# Patient Record
Sex: Female | Born: 1980 | Race: White | Hispanic: No | Marital: Single | Smoking: Never smoker
Health system: Southern US, Community
[De-identification: ages and names within clinical notes are randomized; demographics above are authoritative.]

## PROBLEM LIST (undated history)

## (undated) DIAGNOSIS — K219 Gastro-esophageal reflux disease without esophagitis: Secondary | ICD-10-CM

## (undated) DIAGNOSIS — J189 Pneumonia, unspecified organism: Secondary | ICD-10-CM

## (undated) DIAGNOSIS — E039 Hypothyroidism, unspecified: Secondary | ICD-10-CM

## (undated) DIAGNOSIS — R011 Cardiac murmur, unspecified: Secondary | ICD-10-CM

## (undated) DIAGNOSIS — H509 Unspecified strabismus: Secondary | ICD-10-CM

## (undated) DIAGNOSIS — L309 Dermatitis, unspecified: Secondary | ICD-10-CM

## (undated) DIAGNOSIS — E063 Autoimmune thyroiditis: Secondary | ICD-10-CM

## (undated) DIAGNOSIS — M532X1 Spinal instabilities, occipito-atlanto-axial region: Secondary | ICD-10-CM

## (undated) DIAGNOSIS — Q909 Down syndrome, unspecified: Secondary | ICD-10-CM

## (undated) DIAGNOSIS — S82141A Displaced bicondylar fracture of right tibia, initial encounter for closed fracture: Secondary | ICD-10-CM

## (undated) DIAGNOSIS — S82141K Displaced bicondylar fracture of right tibia, subsequent encounter for closed fracture with nonunion: Secondary | ICD-10-CM

## (undated) DIAGNOSIS — J45909 Unspecified asthma, uncomplicated: Secondary | ICD-10-CM

## (undated) DIAGNOSIS — M069 Rheumatoid arthritis, unspecified: Secondary | ICD-10-CM

## (undated) HISTORY — PX: CARDIAC SURGERY: SHX584

## (undated) HISTORY — PX: OTHER SURGICAL HISTORY: SHX169

## (undated) HISTORY — PX: STRABISMUS SURGERY: SHX218

## (undated) HISTORY — PX: FOOT ARTHRODESIS, TRIPLE: SUR54

---

## 2002-10-16 ENCOUNTER — Ambulatory Visit (HOSPITAL_COMMUNITY): Admission: RE | Admit: 2002-10-16 | Discharge: 2002-10-16 | Payer: Self-pay | Admitting: Family Medicine

## 2002-10-16 ENCOUNTER — Encounter: Payer: Self-pay | Admitting: Family Medicine

## 2004-04-30 ENCOUNTER — Encounter: Admission: RE | Admit: 2004-04-30 | Discharge: 2004-04-30 | Payer: Self-pay | Admitting: Family Medicine

## 2016-10-19 ENCOUNTER — Other Ambulatory Visit: Payer: Self-pay | Admitting: Allergy

## 2016-10-19 ENCOUNTER — Ambulatory Visit
Admission: RE | Admit: 2016-10-19 | Discharge: 2016-10-19 | Disposition: A | Discharge status: Home / Self Care | Payer: Medicaid Other | Source: Ambulatory Visit | Attending: Allergy | Admitting: Allergy

## 2016-10-19 DIAGNOSIS — J4540 Moderate persistent asthma, uncomplicated: Secondary | ICD-10-CM

## 2017-06-20 ENCOUNTER — Observation Stay (HOSPITAL_COMMUNITY): Payer: PRIVATE HEALTH INSURANCE

## 2017-06-20 ENCOUNTER — Inpatient Hospital Stay (HOSPITAL_COMMUNITY)
Admission: EM | Admit: 2017-06-20 | Discharge: 2017-06-25 | DRG: 494 | Disposition: A | Discharge status: Skilled Nursing Facility | Payer: PRIVATE HEALTH INSURANCE | Attending: Orthopedic Surgery | Admitting: Orthopedic Surgery

## 2017-06-20 ENCOUNTER — Emergency Department (HOSPITAL_COMMUNITY): Payer: PRIVATE HEALTH INSURANCE

## 2017-06-20 ENCOUNTER — Encounter (HOSPITAL_COMMUNITY): Payer: Self-pay | Admitting: Emergency Medicine

## 2017-06-20 DIAGNOSIS — G8929 Other chronic pain: Secondary | ICD-10-CM | POA: Diagnosis present

## 2017-06-20 DIAGNOSIS — M532X1 Spinal instabilities, occipito-atlanto-axial region: Secondary | ICD-10-CM

## 2017-06-20 DIAGNOSIS — Y92129 Unspecified place in nursing home as the place of occurrence of the external cause: Secondary | ICD-10-CM | POA: Diagnosis not present

## 2017-06-20 DIAGNOSIS — Z419 Encounter for procedure for purposes other than remedying health state, unspecified: Secondary | ICD-10-CM

## 2017-06-20 DIAGNOSIS — M25461 Effusion, right knee: Secondary | ICD-10-CM | POA: Diagnosis present

## 2017-06-20 DIAGNOSIS — Q909 Down syndrome, unspecified: Secondary | ICD-10-CM

## 2017-06-20 DIAGNOSIS — S82201A Unspecified fracture of shaft of right tibia, initial encounter for closed fracture: Secondary | ICD-10-CM | POA: Diagnosis present

## 2017-06-20 DIAGNOSIS — M069 Rheumatoid arthritis, unspecified: Secondary | ICD-10-CM | POA: Diagnosis present

## 2017-06-20 DIAGNOSIS — S82141A Displaced bicondylar fracture of right tibia, initial encounter for closed fracture: Secondary | ICD-10-CM

## 2017-06-20 DIAGNOSIS — R946 Abnormal results of thyroid function studies: Secondary | ICD-10-CM | POA: Diagnosis present

## 2017-06-20 DIAGNOSIS — S82141K Displaced bicondylar fracture of right tibia, subsequent encounter for closed fracture with nonunion: Secondary | ICD-10-CM | POA: Diagnosis present

## 2017-06-20 DIAGNOSIS — K219 Gastro-esophageal reflux disease without esophagitis: Secondary | ICD-10-CM | POA: Diagnosis present

## 2017-06-20 DIAGNOSIS — S82101A Unspecified fracture of upper end of right tibia, initial encounter for closed fracture: Secondary | ICD-10-CM

## 2017-06-20 DIAGNOSIS — T148XXA Other injury of unspecified body region, initial encounter: Secondary | ICD-10-CM

## 2017-06-20 DIAGNOSIS — M1712 Unilateral primary osteoarthritis, left knee: Secondary | ICD-10-CM | POA: Diagnosis present

## 2017-06-20 DIAGNOSIS — R7989 Other specified abnormal findings of blood chemistry: Secondary | ICD-10-CM | POA: Diagnosis present

## 2017-06-20 HISTORY — DX: Gastro-esophageal reflux disease without esophagitis: K21.9

## 2017-06-20 HISTORY — DX: Rheumatoid arthritis, unspecified: M06.9

## 2017-06-20 HISTORY — DX: Displaced bicondylar fracture of right tibia, initial encounter for closed fracture: S82.141A

## 2017-06-20 HISTORY — DX: Displaced bicondylar fracture of right tibia, subsequent encounter for closed fracture with nonunion: S82.141K

## 2017-06-20 HISTORY — DX: Spinal instabilities, occipito-atlanto-axial region: M53.2X1

## 2017-06-20 HISTORY — DX: Unspecified strabismus: H50.9

## 2017-06-20 HISTORY — DX: Down syndrome, unspecified: Q90.9

## 2017-06-20 LAB — CBC WITH DIFFERENTIAL/PLATELET
Basophils Absolute: 0 10*3/uL (ref 0.0–0.1)
Basophils Relative: 0 %
Eosinophils Absolute: 0 10*3/uL (ref 0.0–0.7)
Eosinophils Relative: 0 %
HCT: 36.6 % (ref 36.0–46.0)
Hemoglobin: 12.6 g/dL (ref 12.0–15.0)
Lymphocytes Relative: 19 %
Lymphs Abs: 1.7 10*3/uL (ref 0.7–4.0)
MCH: 38.2 pg — ABNORMAL HIGH (ref 26.0–34.0)
MCHC: 34.4 g/dL (ref 30.0–36.0)
MCV: 110.9 fL — ABNORMAL HIGH (ref 78.0–100.0)
Monocytes Absolute: 0.2 10*3/uL (ref 0.1–1.0)
Monocytes Relative: 3 %
Neutro Abs: 7.2 10*3/uL (ref 1.7–7.7)
Neutrophils Relative %: 79 %
Platelets: 307 10*3/uL (ref 150–400)
RBC: 3.3 MIL/uL — ABNORMAL LOW (ref 3.87–5.11)
RDW: 14 % (ref 11.5–15.5)
WBC: 9.2 10*3/uL (ref 4.0–10.5)

## 2017-06-20 LAB — URINALYSIS, ROUTINE W REFLEX MICROSCOPIC
Bacteria, UA: NONE SEEN
Bilirubin Urine: NEGATIVE
Glucose, UA: NEGATIVE mg/dL
Ketones, ur: NEGATIVE mg/dL
Leukocytes, UA: NEGATIVE
Nitrite: NEGATIVE
Protein, ur: NEGATIVE mg/dL
Specific Gravity, Urine: 1.018 (ref 1.005–1.030)
pH: 5 (ref 5.0–8.0)

## 2017-06-20 LAB — COMPREHENSIVE METABOLIC PANEL
ALT: 29 U/L (ref 14–54)
AST: 36 U/L (ref 15–41)
Albumin: 3.5 g/dL (ref 3.5–5.0)
Alkaline Phosphatase: 55 U/L (ref 38–126)
Anion gap: 10 (ref 5–15)
BUN: 14 mg/dL (ref 6–20)
CO2: 22 mmol/L (ref 22–32)
Calcium: 8.7 mg/dL — ABNORMAL LOW (ref 8.9–10.3)
Chloride: 111 mmol/L (ref 101–111)
Creatinine, Ser: 0.64 mg/dL (ref 0.44–1.00)
GFR calc Af Amer: >60 mL/min (ref >60)
GFR calc non Af Amer: >60 mL/min (ref >60)
Glucose, Bld: 120 mg/dL — ABNORMAL HIGH (ref 65–99)
Potassium: 3.7 mmol/L (ref 3.5–5.1)
Sodium: 143 mmol/L (ref 135–145)
Total Bilirubin: 0.8 mg/dL (ref 0.3–1.2)
Total Protein: 7.2 g/dL (ref 6.5–8.1)

## 2017-06-20 LAB — ABO/RH: ABO/RH(D): O POS

## 2017-06-20 LAB — PREGNANCY, URINE: Preg Test, Ur: NEGATIVE

## 2017-06-20 LAB — TYPE AND SCREEN
ABO/RH(D): O POS
Antibody Screen: NEGATIVE

## 2017-06-20 MED ORDER — KETOTIFEN FUMARATE 0.025 % OP SOLN
1.0000 [drp] | Freq: Two times a day (BID) | OPHTHALMIC | Status: DC
  Administered 2017-06-21 – 2017-06-25 (×8): 1 [drp] via OPHTHALMIC
  Filled 2017-06-20: qty 5

## 2017-06-20 MED ORDER — ENOXAPARIN SODIUM 40 MG/0.4ML ~~LOC~~ SOLN
40.0000 mg | SUBCUTANEOUS | Status: DC
  Administered 2017-06-21: 40 mg via SUBCUTANEOUS
  Filled 2017-06-20: qty 0.4

## 2017-06-20 MED ORDER — MUPIROCIN 2 % EX OINT
1.0000 "application " | TOPICAL_OINTMENT | Freq: Every day | CUTANEOUS | Status: DC
  Administered 2017-06-21 – 2017-06-25 (×5): 1 via TOPICAL
  Filled 2017-06-20: qty 22

## 2017-06-20 MED ORDER — MORPHINE SULFATE (PF) 4 MG/ML IV SOLN
4.0000 mg | Freq: Once | INTRAVENOUS | Status: AC
  Administered 2017-06-20: 4 mg via INTRAVENOUS
  Filled 2017-06-20: qty 1

## 2017-06-20 MED ORDER — KETOROLAC TROMETHAMINE 15 MG/ML IJ SOLN: 7.5000 mg | Freq: Three times a day (TID) | INTRAMUSCULAR | Status: DC | PRN

## 2017-06-20 MED ORDER — MONTELUKAST SODIUM 10 MG PO TABS
10.0000 mg | ORAL_TABLET | Freq: Every evening | ORAL | Status: DC
  Administered 2017-06-20 – 2017-06-24 (×5): 10 mg via ORAL
  Filled 2017-06-20 (×5): qty 1

## 2017-06-20 MED ORDER — ACETAMINOPHEN 325 MG PO TABS: 650.0000 mg | ORAL_TABLET | Freq: Three times a day (TID) | ORAL | Status: DC

## 2017-06-20 MED ORDER — HYDROMORPHONE HCL 1 MG/ML IJ SOLN: 0.2500 mg | INTRAMUSCULAR | Status: DC | PRN

## 2017-06-20 MED ORDER — PSYLLIUM 95 % PO PACK
1.0000 | PACK | Freq: Every day | ORAL | Status: DC
  Administered 2017-06-21 – 2017-06-25 (×4): 1 via ORAL
  Filled 2017-06-20 (×6): qty 1

## 2017-06-20 MED ORDER — ALBUTEROL SULFATE (2.5 MG/3ML) 0.083% IN NEBU
3.0000 mL | INHALATION_SOLUTION | RESPIRATORY_TRACT | Status: DC | PRN
  Filled 2017-06-20: qty 3

## 2017-06-20 MED ORDER — SORBITOL 70 % SOLN: 30.0000 mL | Freq: Every day | Status: DC | PRN

## 2017-06-20 MED ORDER — ACETAMINOPHEN 650 MG RE SUPP: 650.0000 mg | Freq: Four times a day (QID) | RECTAL | Status: DC | PRN

## 2017-06-20 MED ORDER — POLYETHYLENE GLYCOL 3350 17 G PO PACK: 17.0000 g | PACK | Freq: Every day | ORAL | Status: DC | PRN

## 2017-06-20 MED ORDER — PANTOPRAZOLE SODIUM 40 MG PO TBEC
40.0000 mg | DELAYED_RELEASE_TABLET | Freq: Every day | ORAL | Status: DC
  Administered 2017-06-20 – 2017-06-25 (×5): 40 mg via ORAL
  Filled 2017-06-20 (×5): qty 1

## 2017-06-20 MED ORDER — LEVONORGESTREL-ETHINYL ESTRAD 0.1-20 MG-MCG PO TABS
1.0000 | ORAL_TABLET | Freq: Every day | ORAL | Status: DC
  Administered 2017-06-25: 1 via ORAL

## 2017-06-20 MED ORDER — MAGNESIUM CITRATE PO SOLN
1.0000 | Freq: Once | ORAL | Status: AC | PRN
  Administered 2017-06-25: 1 via ORAL
  Filled 2017-06-20: qty 296

## 2017-06-20 MED ORDER — METHOCARBAMOL 1000 MG/10ML IJ SOLN
500.0000 mg | Freq: Four times a day (QID) | INTRAVENOUS | Status: DC | PRN
  Filled 2017-06-20: qty 5

## 2017-06-20 MED ORDER — OXYCODONE HCL 5 MG PO TABS
5.0000 mg | ORAL_TABLET | Freq: Four times a day (QID) | ORAL | Status: DC | PRN
  Administered 2017-06-20: 10 mg via ORAL
  Administered 2017-06-21: 5 mg via ORAL
  Administered 2017-06-21 – 2017-06-25 (×12): 10 mg via ORAL
  Filled 2017-06-20 (×4): qty 2
  Filled 2017-06-20: qty 1
  Filled 2017-06-20 (×9): qty 2

## 2017-06-20 MED ORDER — HYDROMORPHONE HCL 1 MG/ML IJ SOLN: 1.0000 mg | INTRAMUSCULAR | Status: DC | PRN

## 2017-06-20 MED ORDER — SODIUM CHLORIDE 0.9 % IV SOLN
INTRAVENOUS | Status: AC
  Administered 2017-06-20: 100 mL/h via INTRAVENOUS

## 2017-06-20 MED ORDER — BUDESONIDE 0.25 MG/2ML IN SUSP
0.2500 mg | Freq: Two times a day (BID) | RESPIRATORY_TRACT | Status: DC
  Administered 2017-06-20 – 2017-06-24 (×9): 0.25 mg via RESPIRATORY_TRACT
  Filled 2017-06-20 (×10): qty 2

## 2017-06-20 MED ORDER — ACETAMINOPHEN 325 MG PO TABS
650.0000 mg | ORAL_TABLET | Freq: Four times a day (QID) | ORAL | Status: DC | PRN
  Administered 2017-06-20 – 2017-06-21 (×4): 650 mg via ORAL
  Filled 2017-06-20 (×3): qty 2

## 2017-06-20 MED ORDER — BECLOMETHASONE DIPROPIONATE 40 MCG/ACT IN AERS: 1.0000 | INHALATION_SPRAY | Freq: Two times a day (BID) | RESPIRATORY_TRACT | Status: DC

## 2017-06-20 MED ORDER — FOLIC ACID 1 MG PO TABS
2.0000 mg | ORAL_TABLET | Freq: Every day | ORAL | Status: DC
Start: 2017-06-20 — End: 2017-06-25
  Administered 2017-06-20 – 2017-06-25 (×5): 2 mg via ORAL
  Filled 2017-06-20 (×5): qty 2

## 2017-06-20 MED ORDER — DOCUSATE SODIUM 100 MG PO CAPS
100.0000 mg | ORAL_CAPSULE | Freq: Two times a day (BID) | ORAL | Status: DC
  Administered 2017-06-20 – 2017-06-25 (×9): 100 mg via ORAL
  Filled 2017-06-20 (×9): qty 1

## 2017-06-20 MED ORDER — SENNA 8.6 MG PO TABS
1.0000 | ORAL_TABLET | Freq: Two times a day (BID) | ORAL | Status: DC
Start: 2017-06-20 — End: 2017-06-25
  Administered 2017-06-20 – 2017-06-25 (×9): 8.6 mg via ORAL
  Filled 2017-06-20 (×9): qty 1

## 2017-06-20 MED ORDER — METHOCARBAMOL 500 MG PO TABS
500.0000 mg | ORAL_TABLET | Freq: Four times a day (QID) | ORAL | Status: DC | PRN
  Administered 2017-06-21 – 2017-06-25 (×7): 500 mg via ORAL
  Filled 2017-06-20 (×7): qty 1

## 2017-06-20 MED ORDER — POTASSIUM CHLORIDE CRYS ER 10 MEQ PO TBCR
10.0000 meq | EXTENDED_RELEASE_TABLET | Freq: Two times a day (BID) | ORAL | Status: DC
  Administered 2017-06-20 – 2017-06-25 (×9): 10 meq via ORAL
  Filled 2017-06-20 (×9): qty 1

## 2017-06-20 NOTE — H&P (Signed)
Orthopaedic Trauma Service Consultation  Reason for Consult: Right bicondylar tibial plateau fracture Referring Physician: Shirlyn Goltz, MD  Marissa Gray is an 36 y.o. female.  HPI: Patient reported to have fallen yesterday or the day before with pain, swelling, inability to ambulate; presented to outside Urgent Care and sent back to her group home with a knee immobilizer. No equipment or support there so sent to ED. Patient only wants tylenol for pain control, denies numbness, tingling, or other injury, and is very pleasant. Accompanied by the Group Home director, Aaron Edelman. She has been deemed competent to make her own decisions and sign consent. She was ambulatory without assistive aids before this injury. Denies any history of neck pain or problems. Denies cardiac history.  Past Medical History:  Diagnosis Date  . Arthritis   . Down's syndrome   . Strabismus     Past Surgical History:  Procedure Laterality Date  . CARDIAC SURGERY    . FOOT ARTHRODESIS, TRIPLE      History reviewed. No pertinent family history.  Social History:  reports that she has never smoked. She has never used smokeless tobacco. She reports that she does not drink alcohol. Her drug history is not on file.  Allergies:  Allergies  Allergen Reactions  . Ceclor [Cefaclor]     hives  . Penicillins     Hives Has patient had a PCN reaction causing immediate rash, facial/tongue/throat swelling, SOB or lightheadedness with hypotension: Unknown Has patient had a PCN reaction causing severe rash involving mucus membranes or skin necrosis: Unknown Has patient had a PCN reaction that required hospitalization: Unknown Has patient had a PCN reaction occurring within the last 10 years: Unknown If all of the above answers are "NO", then may proceed with Cephalosporin use.  . Sulfa Antibiotics     Hives, fever    Medications: Prior to Admission:  (Not in a hospital admission)  Results for orders placed or performed  during the hospital encounter of 06/20/17 (from the past 48 hour(s))  Urinalysis, Routine w reflex microscopic     Status: Abnormal   Collection Time: 06/20/17  2:00 PM  Result Value Ref Range   Color, Urine YELLOW YELLOW   APPearance CLEAR CLEAR   Specific Gravity, Urine 1.018 1.005 - 1.030   pH 5.0 5.0 - 8.0   Glucose, UA NEGATIVE NEGATIVE mg/dL   Hgb urine dipstick MODERATE (A) NEGATIVE   Bilirubin Urine NEGATIVE NEGATIVE   Ketones, ur NEGATIVE NEGATIVE mg/dL   Protein, ur NEGATIVE NEGATIVE mg/dL   Nitrite NEGATIVE NEGATIVE   Leukocytes, UA NEGATIVE NEGATIVE   RBC / HPF 0-5 0 - 5 RBC/hpf   WBC, UA 0-5 0 - 5 WBC/hpf   Bacteria, UA NONE SEEN NONE SEEN   Squamous Epithelial / LPF 0-5 (A) NONE SEEN   Mucous PRESENT   Pregnancy, urine     Status: None   Collection Time: 06/20/17  2:13 PM  Result Value Ref Range   Preg Test, Ur NEGATIVE NEGATIVE    Comment:        THE SENSITIVITY OF THIS METHODOLOGY IS >20 mIU/mL.   CBC with Differential/Platelet     Status: Abnormal   Collection Time: 06/20/17  2:50 PM  Result Value Ref Range   WBC 9.2 4.0 - 10.5 K/uL   RBC 3.30 (L) 3.87 - 5.11 MIL/uL   Hemoglobin 12.6 12.0 - 15.0 g/dL   HCT 36.6 36.0 - 46.0 %   MCV 110.9 (H) 78.0 - 100.0 fL  MCH 38.2 (H) 26.0 - 34.0 pg   MCHC 34.4 30.0 - 36.0 g/dL   RDW 14.0 11.5 - 15.5 %   Platelets 307 150 - 400 K/uL   Neutrophils Relative % 79 %   Neutro Abs 7.2 1.7 - 7.7 K/uL   Lymphocytes Relative 19 %   Lymphs Abs 1.7 0.7 - 4.0 K/uL   Monocytes Relative 3 %   Monocytes Absolute 0.2 0.1 - 1.0 K/uL   Eosinophils Relative 0 %   Eosinophils Absolute 0.0 0.0 - 0.7 K/uL   Basophils Relative 0 %   Basophils Absolute 0.0 0.0 - 0.1 K/uL  Comprehensive metabolic panel     Status: Abnormal   Collection Time: 06/20/17  2:50 PM  Result Value Ref Range   Sodium 143 135 - 145 mmol/L   Potassium 3.7 3.5 - 5.1 mmol/L   Chloride 111 101 - 111 mmol/L   CO2 22 22 - 32 mmol/L   Glucose, Bld 120 (H) 65 - 99  mg/dL   BUN 14 6 - 20 mg/dL   Creatinine, Ser 0.64 0.44 - 1.00 mg/dL   Calcium 8.7 (L) 8.9 - 10.3 mg/dL   Total Protein 7.2 6.5 - 8.1 g/dL   Albumin 3.5 3.5 - 5.0 g/dL   AST 36 15 - 41 U/L   ALT 29 14 - 54 U/L   Alkaline Phosphatase 55 38 - 126 U/L   Total Bilirubin 0.8 0.3 - 1.2 mg/dL   GFR calc non Af Amer >60 >60 mL/min   GFR calc Af Amer >60 >60 mL/min    Comment: (NOTE) The eGFR has been calculated using the CKD EPI equation. This calculation has not been validated in all clinical situations. eGFR's persistently <60 mL/min signify possible Chronic Kidney Disease.    Anion gap 10 5 - 15  Type and screen     Status: None   Collection Time: 06/20/17  2:50 PM  Result Value Ref Range   ABO/RH(D) O POS    Antibody Screen NEG    Sample Expiration 06/23/2017   ABO/Rh     Status: None   Collection Time: 06/20/17  2:50 PM  Result Value Ref Range   ABO/RH(D) O POS     Dg Tibia/fibula Right  Result Date: 06/20/2017 CLINICAL DATA:  Fall yesterday with right knee/lower leg pain. EXAM: RIGHT TIBIA AND FIBULA - 2 VIEW COMPARISON:  None. FINDINGS: There is a transverse fracture with comminution involving the proximal tibia metaphysis extending to the articular surface. There is also a displaced transverse fracture of the proximal fibular metaphysis. Findings suggest lateral rotation of the talus with respect to the tibia at the ankle. Orthopedic screw projected over the base of the first metatarsal. IMPRESSION: Displaced transverse fracture with comminution involving the proximal tibia metaphysis extending to the articular surface. Displaced proximal fibular metaphyseal fracture. Possible rotatory subluxation at the tibiotalar joint. Consider ankle series for further evaluation. Electronically Signed   By: Marin Olp M.D.   On: 06/20/2017 14:19   Ct Knee Right Wo Contrast  Result Date: 06/20/2017 CLINICAL DATA:  Fractures of the proximal right tibia and fibula. EXAM: CT OF THE RIGHT KNEE  WITHOUT CONTRAST TECHNIQUE: Multidetector CT imaging of the right knee was performed according to the standard protocol. Multiplanar CT image reconstructions were also generated. COMPARISON:  Radiographs dated 06/20/2017 FINDINGS: Bones/Joint/Cartilage There is an impacted comminuted fracture of the proximal right tibia. The majority of the medial tibial plateau is intact. The fracture involves only the posterolateral  aspect of the medial tibial plateau. However, the lateral tibial plateau is comminuted. There is approximately 13 mm of impaction of proximal tibial shaft into the proximal tibia. The distal femur is intact. There is a comminuted slightly impacted fracture of the head of the fibula. Hemarthrosis. IMPRESSION: Comminuted impacted fracture of the proximal tibia as described. Comminuted fracture of the fibular head. Electronically Signed   By: Lorriane Shire M.D.   On: 06/20/2017 17:04   Dg Pelvis Portable  Result Date: 06/20/2017 CLINICAL DATA:  Pt from group home. Staff report pt had a fall yesterday afternoon. Went to [facility] and was diagnosed with a R tib/fib fracture. H/o arthritis and down's syndrome. Best obtainable, pt in pain. EXAM: PORTABLE PELVIS 1-2 VIEWS COMPARISON:  None. FINDINGS: Insert located. No evidence of femoral neck fracture on single view. Pannus does overlie the femoral necks somewhat limiting sensitivity. No pelvic fracture or sacral fracture. IMPRESSION: 1. No evidence of pelvic fracture. 2. No evidence of femoral neck fracture on suboptimal exam Electronically Signed   By: Suzy Bouchard M.D.   On: 06/20/2017 14:45    ROS No recent fever, bleeding abnormalities, urologic dysfunction, GI problems, or weight gain. Did have a GI ulcer at some point and so wants to avoid stronger medicines than tylenol  Blood pressure 138/78, pulse 88, temperature 98.1 F (36.7 C), temperature source Oral, resp. rate 16, SpO2 95 %. Physical Exam  Trisomy 21 facial features and  stature A&O and very pleasant No cervical tenderness or significant restriction RRR No wheezing Truncal obesity RLE Traumatic abrasions over the front of the knee; some medial ecchymosis  Tender  Hemarthrosis  Sens DPN, SPN, TN intact  Motor EHL, ext, flex, evers intact  DP palp, No significant edema  Skiin almost wrinkle  In area of anticipated incision LUEx  Sens  Ax/R/M/U intact  Mot   Ax/ R/ PIN/ M/ AIN/ U intact  Brisk CR RUEx  Sens  Ax/R/M/U intact  Mot   Ax/ R/ PIN/ M/ AIN/ U intact  Brisk CR  Assessment/Plan: Right bicondylar tibial plateau fracture Swelling Ceph allergy  1. C spine clearance given possibility of AAI in Down syndrome patients 2. Elevation, ice, compression wrap:  I applied this with nondadherent bulky drsg now with the assistance of nursing 3. ORIF on Tuesday to allow few days for soft tissue swelling to resolve.  Altamese Polk, MD [facility] [phone] (p)  06/20/2017  6:42 PM

## 2017-06-20 NOTE — ED Notes (Signed)
Pt requested blood work to be drawn from IV RN made aware
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
## 2017-06-20 NOTE — Progress Notes (Signed)
Consent obtained from patient. Mother signed consent also.  Myrene Galas, MD [facility] [phone] (p)

## 2017-06-20 NOTE — ED Triage Notes (Addendum)
Pt from group home. Staff report pt had a fall yesterday afternoon. Went to [facility] and was diagnosed with a R tib/fib fracture. Staff report they have been unable to care for pt at the group home. Pt arrives with leg immobilizer and copy of x ray on a CD

## 2017-06-21 ENCOUNTER — Inpatient Hospital Stay (HOSPITAL_COMMUNITY): Payer: PRIVATE HEALTH INSURANCE

## 2017-06-21 ENCOUNTER — Encounter (HOSPITAL_COMMUNITY): Payer: Self-pay | Admitting: Orthopedic Surgery

## 2017-06-21 DIAGNOSIS — Q909 Down syndrome, unspecified: Secondary | ICD-10-CM

## 2017-06-21 DIAGNOSIS — M069 Rheumatoid arthritis, unspecified: Secondary | ICD-10-CM | POA: Diagnosis present

## 2017-06-21 LAB — HIV ANTIBODY (ROUTINE TESTING W REFLEX): HIV Screen 4th Generation wRfx: NONREACTIVE

## 2017-06-21 MED ORDER — POTASSIUM CHLORIDE IN NACL 20-0.9 MEQ/L-% IV SOLN
INTRAVENOUS | Status: DC
  Administered 2017-06-21: 15:00:00 via INTRAVENOUS
  Filled 2017-06-21 (×2): qty 1000

## 2017-06-21 MED ORDER — CLINDAMYCIN PHOSPHATE 900 MG/50ML IV SOLN
900.0000 mg | Freq: Once | INTRAVENOUS | Status: AC
  Administered 2017-06-22: 900 mg via INTRAVENOUS
  Filled 2017-06-21 (×2): qty 50

## 2017-06-21 NOTE — Progress Notes (Signed)
PT Cancellation Note  Patient Details Name: Marissa Gray MRN: [id number] DOB: 1981/04/23   Cancelled Treatment:    Reason Eval/Treat Not Completed: Patient not medically ready. Pt is planned for ORIF by Dr. Sherlean Foot on Tues 06/22/17. PT to return as appropriate s/p surgery.    Abhi Moccia M Odas Ozer 06/21/2017, 7:30 AM   Lewis Shock, PT, DPT Pager #: [phone] Office #: [phone]

## 2017-06-21 NOTE — Progress Notes (Signed)
OT Cancellation    06/21/17 0800  OT Visit Information  Last OT Received On 06/21/17  Reason Eval/Treat Not Completed Patient not medically ready. Pt is planned for ORIF by Dr. Sherlean Foot on Tues 06/22/17. Acute OT will return as appropriate s/p surgery. Thank you.   Esmee Fallaw MSOT, OTR/L Acute Rehab Pager: [phone] Office: [phone]

## 2017-06-21 NOTE — Progress Notes (Signed)
Orthopedic Trauma Service Progress Note    Subjective:  Doing well Very pleasant Mom at bedside  C/o pain R knee  + subluxation b/t C1 and C2 on c-spine xrays with flex/ext noted Mom states pt was evaluated many years ago and the work up was negative. 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 Was supposed to have an appointment this week for steroid injection to L knee    Review of Systems  Constitutional: Negative for chills and fever.  Respiratory: Negative for shortness of breath and wheezing.   Cardiovascular: Negative for chest pain and palpitations.  Neurological: Negative for tingling and sensory change.    Objective:   VITALS:   Vitals:   06/20/17 2019 06/20/17 2056 06/20/17 2157 06/21/17 0419  BP: 105/72 116/62  (!) 90/58  Pulse: 91 93  87  Resp: 16 16  16   Temp: 97.9 F (36.6 C) 97.5 F (36.4 C)  98.3 F (36.8 C)  TempSrc:  Oral  Oral  SpO2: 97% 100% 98% 100%  Weight:  91.4 kg (201 lb 8 oz)      Intake/Output      06/24 0701 - 06/25 0700 06/25 0701 - 06/26 0700   I.V. (mL/kg) 556.7 (6.1)    IV Piggyback 0    Total Intake(mL/kg) 556.7 (6.1)    Net +556.7          Urine Occurrence 1 x 1 x     LABS  Results for orders placed or performed during the hospital encounter of 06/20/17 (from the past 24 hour(s))  Urinalysis, Routine w reflex microscopic     Status: Abnormal   Collection Time: 06/20/17  2:00 PM  Result Value Ref Range   Color, Urine YELLOW YELLOW   APPearance CLEAR CLEAR   Specific Gravity, Urine 1.018 1.005 - 1.030   pH 5.0 5.0 - 8.0   Glucose, UA NEGATIVE NEGATIVE mg/dL   Hgb urine dipstick MODERATE (A) NEGATIVE   Bilirubin Urine NEGATIVE  NEGATIVE   Ketones, ur NEGATIVE NEGATIVE mg/dL   Protein, ur NEGATIVE NEGATIVE mg/dL   Nitrite NEGATIVE NEGATIVE   Leukocytes, UA NEGATIVE NEGATIVE   RBC / HPF 0-5 0 - 5 RBC/hpf   WBC, UA 0-5 0 - 5 WBC/hpf   Bacteria, UA NONE SEEN NONE SEEN   Squamous Epithelial / LPF 0-5 (A) NONE SEEN   Mucous PRESENT   Pregnancy, urine     Status: None   Collection Time: 06/20/17  2:13 PM  Result Value Ref Range   Preg Test, Ur NEGATIVE NEGATIVE  CBC with Differential/Platelet     Status: Abnormal   Collection Time: 06/20/17  2:50 PM  Result Value Ref Range   WBC 9.2 4.0 - 10.5 K/uL   RBC 3.30 (L) 3.87 - 5.11 MIL/uL   Hemoglobin 12.6 12.0 - 15.0 g/dL   HCT 78.6 75.4 - 49.2 %   MCV 110.9 (H) 78.0 - 100.0 fL   MCH 38.2 (H) 26.0 - 34.0 pg   MCHC 34.4 30.0 - 36.0 g/dL   RDW 01.0 07.1 - 21.9 %   Platelets 307 150 - 400 K/uL   Neutrophils Relative % 79 %   Neutro Abs 7.2 1.7 - 7.7 K/uL  Lymphocytes Relative 19 %   Lymphs Abs 1.7 0.7 - 4.0 K/uL   Monocytes Relative 3 %   Monocytes Absolute 0.2 0.1 - 1.0 K/uL   Eosinophils Relative 0 %   Eosinophils Absolute 0.0 0.0 - 0.7 K/uL   Basophils Relative 0 %   Basophils Absolute 0.0 0.0 - 0.1 K/uL  Comprehensive metabolic panel     Status: Abnormal   Collection Time: 06/20/17  2:50 PM  Result Value Ref Range   Sodium 143 135 - 145 mmol/L   Potassium 3.7 3.5 - 5.1 mmol/L   Chloride 111 101 - 111 mmol/L   CO2 22 22 - 32 mmol/L   Glucose, Bld 120 (H) 65 - 99 mg/dL   BUN 14 6 - 20 mg/dL   Creatinine, Ser 2.77 0.44 - 1.00 mg/dL   Calcium 8.7 (L) 8.9 - 10.3 mg/dL   Total Protein 7.2 6.5 - 8.1 g/dL   Albumin 3.5 3.5 - 5.0 g/dL   AST 36 15 - 41 U/L   ALT 29 14 - 54 U/L   Alkaline Phosphatase 55 38 - 126 U/L   Total Bilirubin 0.8 0.3 - 1.2 mg/dL   GFR calc non Af Amer >60 >60 mL/min   GFR calc Af Amer >60 >60 mL/min   Anion gap 10 5 - 15  Type and screen     Status: None   Collection Time: 06/20/17  2:50 PM  Result Value Ref Range   ABO/RH(D) O  POS    Antibody Screen NEG    Sample Expiration 06/23/2017   ABO/Rh     Status: None   Collection Time: 06/20/17  2:50 PM  Result Value Ref Range   ABO/RH(D) O POS      PHYSICAL EXAM:   Gen: comfortable appearing, very pleasant and talkative, Trisomy 21 facial features and stature  Neck: no c-spine tenderness Lungs: clear B  Cardiac: S1 and S2 Abd: + BS, NTND Ext:       Right Lower Extremity   Knee immobilizer and dressing in place   Distal motor and sensory functions intact  Ext warm   Swelling stable, did not remove compressive dressing     Assessment/Plan:     Active Problems:   Closed right tibial fracture   Anti-infectives    None    .  POD/HD#: 1  36 y/o female with Down syndrome with ? Subacute R tibial plateau fracture   -R bicondylar tibial plateau fracture   Will need ORIF to stabilize fracture and restore length and alignment  Hopeful for OR tomorrow   Continue ice and elevation   NWB now and for 8 weeks post op     Bone quality on CT and xray looks poor   Will perform metabolic bone workup to identify any modifiable factors    Will allow unrestricted ROM of R knee post op     Given shape of leg it will be very difficult to get a hinged knee brace   - chronic L knee pain   Will touch base with Rheumatology to see of they are ok with [place] doing steroid injection in OR. 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

## 2017-06-22 ENCOUNTER — Inpatient Hospital Stay (HOSPITAL_COMMUNITY): Payer: PRIVATE HEALTH INSURANCE

## 2017-06-22 ENCOUNTER — Encounter (HOSPITAL_COMMUNITY): Payer: Self-pay | Admitting: Certified Registered Nurse Anesthetist

## 2017-06-22 ENCOUNTER — Encounter (HOSPITAL_COMMUNITY)
Admission: EM | Disposition: A | Discharge status: Skilled Nursing Facility | Payer: Self-pay | Source: Home / Self Care | Attending: Orthopedic Surgery

## 2017-06-22 ENCOUNTER — Inpatient Hospital Stay (HOSPITAL_COMMUNITY): Payer: PRIVATE HEALTH INSURANCE | Admitting: Certified Registered Nurse Anesthetist

## 2017-06-22 HISTORY — PX: ORIF TIBIA PLATEAU: SHX2132

## 2017-06-22 LAB — PHOSPHORUS: Phosphorus: 3.4 mg/dL (ref 2.5–4.6)

## 2017-06-22 LAB — COMPREHENSIVE METABOLIC PANEL
ALT: 21 U/L (ref 14–54)
AST: 22 U/L (ref 15–41)
Albumin: 2.5 g/dL — ABNORMAL LOW (ref 3.5–5.0)
Alkaline Phosphatase: 45 U/L (ref 38–126)
Anion gap: 5 (ref 5–15)
BUN: 13 mg/dL (ref 6–20)
CO2: 23 mmol/L (ref 22–32)
Calcium: 7.8 mg/dL — ABNORMAL LOW (ref 8.9–10.3)
Chloride: 113 mmol/L — ABNORMAL HIGH (ref 101–111)
Creatinine, Ser: 0.56 mg/dL (ref 0.44–1.00)
GFR calc Af Amer: >60 mL/min (ref >60)
GFR calc non Af Amer: >60 mL/min (ref >60)
Glucose, Bld: 91 mg/dL (ref 65–99)
Potassium: 4.1 mmol/L (ref 3.5–5.1)
Sodium: 141 mmol/L (ref 135–145)
Total Bilirubin: 0.3 mg/dL (ref 0.3–1.2)
Total Protein: 5.5 g/dL — ABNORMAL LOW (ref 6.5–8.1)

## 2017-06-22 LAB — CBC
HCT: 30.3 % — ABNORMAL LOW (ref 36.0–46.0)
Hemoglobin: 10 g/dL — ABNORMAL LOW (ref 12.0–15.0)
MCH: 37.5 pg — ABNORMAL HIGH (ref 26.0–34.0)
MCHC: 33 g/dL (ref 30.0–36.0)
MCV: 113.5 fL — ABNORMAL HIGH (ref 78.0–100.0)
Platelets: 253 10*3/uL (ref 150–400)
RBC: 2.67 MIL/uL — ABNORMAL LOW (ref 3.87–5.11)
RDW: 14 % (ref 11.5–15.5)
WBC: 7.1 10*3/uL (ref 4.0–10.5)

## 2017-06-22 LAB — TSH: TSH: 8.153 u[IU]/mL — ABNORMAL HIGH (ref 0.350–4.500)

## 2017-06-22 LAB — PREALBUMIN: Prealbumin: 20.3 mg/dL (ref 18–38)

## 2017-06-22 LAB — MAGNESIUM: Magnesium: 1.9 mg/dL (ref 1.7–2.4)

## 2017-06-22 SURGERY — OPEN REDUCTION INTERNAL FIXATION (ORIF) TIBIAL PLATEAU
Anesthesia: General | Laterality: Right

## 2017-06-22 MED ORDER — PHENYLEPHRINE 40 MCG/ML (10ML) SYRINGE FOR IV PUSH (FOR BLOOD PRESSURE SUPPORT)
PREFILLED_SYRINGE | INTRAVENOUS | Status: AC
  Filled 2017-06-22: qty 10

## 2017-06-22 MED ORDER — MEPERIDINE HCL 25 MG/ML IJ SOLN: 6.2500 mg | INTRAMUSCULAR | Status: DC | PRN

## 2017-06-22 MED ORDER — FENTANYL CITRATE (PF) 100 MCG/2ML IJ SOLN
INTRAMUSCULAR | Status: DC | PRN
  Administered 2017-06-22 (×6): 50 ug via INTRAVENOUS

## 2017-06-22 MED ORDER — SUGAMMADEX SODIUM 200 MG/2ML IV SOLN
INTRAVENOUS | Status: AC
  Filled 2017-06-22: qty 2

## 2017-06-22 MED ORDER — FENTANYL CITRATE (PF) 100 MCG/2ML IJ SOLN
INTRAMUSCULAR | Status: AC
  Filled 2017-06-22: qty 2

## 2017-06-22 MED ORDER — LIDOCAINE HCL 1 % IJ SOLN
INTRAMUSCULAR | Status: DC | PRN
  Administered 2017-06-22: 2 mL

## 2017-06-22 MED ORDER — PROPOFOL 10 MG/ML IV BOLUS
INTRAVENOUS | Status: AC
  Filled 2017-06-22: qty 20

## 2017-06-22 MED ORDER — ENOXAPARIN SODIUM 40 MG/0.4ML ~~LOC~~ SOLN
40.0000 mg | SUBCUTANEOUS | Status: DC
  Administered 2017-06-23 – 2017-06-25 (×3): 40 mg via SUBCUTANEOUS
  Filled 2017-06-22 (×3): qty 0.4

## 2017-06-22 MED ORDER — BUPIVACAINE HCL (PF) 0.5 % IJ SOLN
INTRAMUSCULAR | Status: AC
Start: 2017-06-22 — End: ?
  Filled 2017-06-22: qty 30

## 2017-06-22 MED ORDER — METOCLOPRAMIDE HCL 5 MG/ML IJ SOLN: 5.0000 mg | Freq: Three times a day (TID) | INTRAMUSCULAR | Status: DC | PRN

## 2017-06-22 MED ORDER — PHENYLEPHRINE HCL 10 MG/ML IJ SOLN
INTRAMUSCULAR | Status: DC | PRN
  Administered 2017-06-22: 40 ug via INTRAVENOUS
  Administered 2017-06-22 (×2): 80 ug via INTRAVENOUS

## 2017-06-22 MED ORDER — METHYLPREDNISOLONE ACETATE 80 MG/ML IJ SUSP
INTRAMUSCULAR | Status: AC
Start: 2017-06-22 — End: ?
  Filled 2017-06-22: qty 1

## 2017-06-22 MED ORDER — PROPOFOL 10 MG/ML IV BOLUS
INTRAVENOUS | Status: DC | PRN
  Administered 2017-06-22: 100 mg via INTRAVENOUS
  Administered 2017-06-22: 20 mg via INTRAVENOUS

## 2017-06-22 MED ORDER — ONDANSETRON HCL 4 MG PO TABS: 4.0000 mg | ORAL_TABLET | Freq: Four times a day (QID) | ORAL | Status: DC | PRN

## 2017-06-22 MED ORDER — DEXAMETHASONE SODIUM PHOSPHATE 10 MG/ML IJ SOLN
INTRAMUSCULAR | Status: DC | PRN
  Administered 2017-06-22: 4 mg via INTRAVENOUS

## 2017-06-22 MED ORDER — METOCLOPRAMIDE HCL 5 MG PO TABS: 5.0000 mg | ORAL_TABLET | Freq: Three times a day (TID) | ORAL | Status: DC | PRN

## 2017-06-22 MED ORDER — METHYLPREDNISOLONE ACETATE 80 MG/ML IJ SUSP
INTRAMUSCULAR | Status: DC | PRN
  Administered 2017-06-22: 60 mg

## 2017-06-22 MED ORDER — ONDANSETRON HCL 4 MG/2ML IJ SOLN
INTRAMUSCULAR | Status: AC
Start: 2017-06-22 — End: ?
  Filled 2017-06-22: qty 2

## 2017-06-22 MED ORDER — ONDANSETRON HCL 4 MG/2ML IJ SOLN: 4.0000 mg | Freq: Four times a day (QID) | INTRAMUSCULAR | Status: DC | PRN

## 2017-06-22 MED ORDER — 0.9 % SODIUM CHLORIDE (POUR BTL) OPTIME
TOPICAL | Status: DC | PRN
  Administered 2017-06-22: 1000 mL

## 2017-06-22 MED ORDER — MIDAZOLAM HCL 2 MG/2ML IJ SOLN
INTRAMUSCULAR | Status: AC
  Filled 2017-06-22: qty 2

## 2017-06-22 MED ORDER — ONDANSETRON HCL 4 MG/2ML IJ SOLN
INTRAMUSCULAR | Status: DC | PRN
  Administered 2017-06-22: 4 mg via INTRAVENOUS

## 2017-06-22 MED ORDER — LACTATED RINGERS IV SOLN
INTRAVENOUS | Status: DC
  Administered 2017-06-22 (×3): via INTRAVENOUS

## 2017-06-22 MED ORDER — PHENYLEPHRINE HCL 10 MG/ML IJ SOLN
INTRAVENOUS | Status: DC | PRN
  Administered 2017-06-22: 10 ug/min via INTRAVENOUS

## 2017-06-22 MED ORDER — HYDROMORPHONE HCL 1 MG/ML IJ SOLN: 0.2500 mg | INTRAMUSCULAR | Status: DC | PRN

## 2017-06-22 MED ORDER — BUPIVACAINE HCL (PF) 0.5 % IJ SOLN
INTRAMUSCULAR | Status: DC | PRN
  Administered 2017-06-22: 2 mL

## 2017-06-22 MED ORDER — LIDOCAINE HCL (PF) 1 % IJ SOLN
INTRAMUSCULAR | Status: AC
Start: 2017-06-22 — End: ?
  Filled 2017-06-22: qty 30

## 2017-06-22 MED ORDER — ROCURONIUM BROMIDE 10 MG/ML (PF) SYRINGE
PREFILLED_SYRINGE | INTRAVENOUS | Status: AC
Start: 2017-06-22 — End: ?
  Filled 2017-06-22: qty 5

## 2017-06-22 MED ORDER — ROCURONIUM BROMIDE 100 MG/10ML IV SOLN
INTRAVENOUS | Status: DC | PRN
  Administered 2017-06-22: 40 mg via INTRAVENOUS
  Administered 2017-06-22: 20 mg via INTRAVENOUS

## 2017-06-22 MED ORDER — CLINDAMYCIN PHOSPHATE 600 MG/50ML IV SOLN
600.0000 mg | Freq: Four times a day (QID) | INTRAVENOUS | Status: AC
  Administered 2017-06-22 – 2017-06-23 (×3): 600 mg via INTRAVENOUS
  Filled 2017-06-22 (×4): qty 50

## 2017-06-22 MED ORDER — FENTANYL CITRATE (PF) 250 MCG/5ML IJ SOLN
INTRAMUSCULAR | Status: AC
  Filled 2017-06-22: qty 5

## 2017-06-22 MED ORDER — FENTANYL CITRATE (PF) 100 MCG/2ML IJ SOLN
25.0000 ug | INTRAMUSCULAR | Status: DC | PRN
  Administered 2017-06-22: 25 ug via INTRAVENOUS

## 2017-06-22 MED ORDER — DEXAMETHASONE SODIUM PHOSPHATE 10 MG/ML IJ SOLN
INTRAMUSCULAR | Status: AC
  Filled 2017-06-22: qty 1

## 2017-06-22 MED ORDER — MIDAZOLAM HCL 5 MG/5ML IJ SOLN
INTRAMUSCULAR | Status: DC | PRN
  Administered 2017-06-22 (×2): 2 mg via INTRAVENOUS

## 2017-06-22 MED ORDER — ACETAMINOPHEN 10 MG/ML IV SOLN
1000.0000 mg | Freq: Four times a day (QID) | INTRAVENOUS | Status: AC
  Administered 2017-06-22 – 2017-06-23 (×4): 1000 mg via INTRAVENOUS
  Filled 2017-06-22 (×4): qty 100

## 2017-06-22 MED ORDER — LIDOCAINE HCL (CARDIAC) 20 MG/ML IV SOLN
INTRAVENOUS | Status: DC | PRN
  Administered 2017-06-22: 100 mg via INTRAVENOUS

## 2017-06-22 MED ORDER — EPHEDRINE 5 MG/ML INJ
INTRAVENOUS | Status: AC
Start: 2017-06-22 — End: ?
  Filled 2017-06-22: qty 10

## 2017-06-22 MED ORDER — POTASSIUM CHLORIDE IN NACL 20-0.9 MEQ/L-% IV SOLN
INTRAVENOUS | Status: DC
  Administered 2017-06-22 – 2017-06-24 (×3): via INTRAVENOUS
  Filled 2017-06-22 (×3): qty 1000

## 2017-06-22 MED ORDER — ONDANSETRON HCL 4 MG/2ML IJ SOLN: 4.0000 mg | Freq: Once | INTRAMUSCULAR | Status: DC | PRN

## 2017-06-22 MED ORDER — SUGAMMADEX SODIUM 200 MG/2ML IV SOLN
INTRAVENOUS | Status: DC | PRN
  Administered 2017-06-22: 200 mg via INTRAVENOUS

## 2017-06-22 SURGICAL SUPPLY — 90 items
BANDAGE ACE 4X5 VEL STRL LF (GAUZE/BANDAGES/DRESSINGS) ×2 IMPLANT
BANDAGE ACE 6X5 VEL STRL LF (GAUZE/BANDAGES/DRESSINGS) ×2 IMPLANT
BANDAGE ELASTIC 4 VELCRO ST LF (GAUZE/BANDAGES/DRESSINGS) ×1 IMPLANT
BANDAGE ESMARK 6X9 LF (GAUZE/BANDAGES/DRESSINGS) ×1 IMPLANT
BLADE CLIPPER SURG (BLADE) IMPLANT
BLADE SURG 10 STRL SS (BLADE) ×2 IMPLANT
BLADE SURG 15 STRL LF DISP TIS (BLADE) ×1 IMPLANT
BLADE SURG 15 STRL SS (BLADE) ×2
BNDG CMPR 9X6 STRL LF SNTH (GAUZE/BANDAGES/DRESSINGS) ×1
BNDG COHESIVE 4X5 TAN STRL (GAUZE/BANDAGES/DRESSINGS) ×2 IMPLANT
BNDG ESMARK 6X9 LF (GAUZE/BANDAGES/DRESSINGS) ×2
BNDG GAUZE ELAST 4 BULKY (GAUZE/BANDAGES/DRESSINGS) ×2 IMPLANT
BONE CANC CHIPS 40CC CAN1/2 (Bone Implant) ×4 IMPLANT
BRUSH SCRUB SURG 4.25 DISP (MISCELLANEOUS) ×4 IMPLANT
CANISTER SUCT 3000ML PPV (MISCELLANEOUS) ×2 IMPLANT
CHIPS CANC BONE 40CC CAN1/2 (Bone Implant) ×2 IMPLANT
COVER SURGICAL LIGHT HANDLE (MISCELLANEOUS) ×2 IMPLANT
CUFF TOURNIQUET SINGLE 34IN LL (TOURNIQUET CUFF) ×2 IMPLANT
DRAPE C-ARM 42X72 X-RAY (DRAPES) ×2 IMPLANT
DRAPE C-ARMOR (DRAPES) ×2 IMPLANT
DRAPE HALF SHEET 40X57 (DRAPES) IMPLANT
DRAPE INCISE IOBAN 66X45 STRL (DRAPES) ×2 IMPLANT
DRAPE ORTHO SPLIT 77X108 STRL (DRAPES)
DRAPE SURG ORHT 6 SPLT 77X108 (DRAPES) IMPLANT
DRAPE U-SHAPE 47X51 STRL (DRAPES) ×2 IMPLANT
DRILL BIT 2.5X100 214235007 (MISCELLANEOUS) ×2 IMPLANT
DRILL BIT 2.7X100 214235006 DU (MISCELLANEOUS) ×1 IMPLANT
DRSG ADAPTIC 3X8 NADH LF (GAUZE/BANDAGES/DRESSINGS) ×2 IMPLANT
DRSG MEPILEX BORDER 4X4 (GAUZE/BANDAGES/DRESSINGS) ×1 IMPLANT
DRSG PAD ABDOMINAL 8X10 ST (GAUZE/BANDAGES/DRESSINGS) ×4 IMPLANT
ELECT REM PT RETURN 9FT ADLT (ELECTROSURGICAL) ×2
ELECTRODE REM PT RTRN 9FT ADLT (ELECTROSURGICAL) ×1 IMPLANT
GAUZE SPONGE 4X4 12PLY STRL (GAUZE/BANDAGES/DRESSINGS) ×2 IMPLANT
GLOVE BIO SURGEON STRL SZ7.5 (GLOVE) ×2 IMPLANT
GLOVE BIO SURGEON STRL SZ8 (GLOVE) ×2 IMPLANT
GLOVE BIOGEL PI IND STRL 7.5 (GLOVE) ×1 IMPLANT
GLOVE BIOGEL PI IND STRL 8 (GLOVE) ×1 IMPLANT
GLOVE BIOGEL PI INDICATOR 7.5 (GLOVE) ×1
GLOVE BIOGEL PI INDICATOR 8 (GLOVE) ×1
GOWN STRL REUS W/ TWL LRG LVL3 (GOWN DISPOSABLE) ×2 IMPLANT
GOWN STRL REUS W/ TWL XL LVL3 (GOWN DISPOSABLE) ×1 IMPLANT
GOWN STRL REUS W/TWL LRG LVL3 (GOWN DISPOSABLE) ×4
GOWN STRL REUS W/TWL XL LVL3 (GOWN DISPOSABLE) ×2
IMMOBILIZER KNEE 22 UNIV (SOFTGOODS) ×2 IMPLANT
KIT BASIN OR (CUSTOM PROCEDURE TRAY) ×2 IMPLANT
KIT INFUSE LRG II (Orthopedic Implant) ×2 IMPLANT
KIT INFUSE MEDIUM (Orthopedic Implant) ×1 IMPLANT
KIT ROOM TURNOVER OR (KITS) ×2 IMPLANT
NDL SUT 6 .5 CRC .975X.05 MAYO (NEEDLE) IMPLANT
NEEDLE MAYO TAPER (NEEDLE)
NS IRRIG 1000ML POUR BTL (IV SOLUTION) ×2 IMPLANT
PACK ORTHO EXTREMITY (CUSTOM PROCEDURE TRAY) ×2 IMPLANT
PAD ARMBOARD 7.5X6 YLW CONV (MISCELLANEOUS) ×4 IMPLANT
PAD CAST 4YDX4 CTTN HI CHSV (CAST SUPPLIES) ×1 IMPLANT
PADDING CAST ABS 6INX4YD NS (CAST SUPPLIES) ×1
PADDING CAST ABS COTTON 6X4 NS (CAST SUPPLIES) IMPLANT
PADDING CAST COTTON 4X4 STRL (CAST SUPPLIES) ×2
PADDING CAST COTTON 6X4 STRL (CAST SUPPLIES) ×2 IMPLANT
PLATE LOCK 5H STD RT PROX TIB (Plate) ×1 IMPLANT
SCREW CORTICAL 3.5MM  28MM (Screw) ×1 IMPLANT
SCREW CORTICAL 3.5MM  30MM (Screw) ×1 IMPLANT
SCREW CORTICAL 3.5MM 28MM (Screw) IMPLANT
SCREW CORTICAL 3.5MM 30MM (Screw) IMPLANT
SCREW LOCK CORT STAR 3.5X26 (Screw) ×1 IMPLANT
SCREW LOCK CORT STAR 3.5X60 (Screw) ×1 IMPLANT
SCREW LOCK CORT STAR 3.5X65 (Screw) ×2 IMPLANT
SCREW LOCK CORT STAR 3.5X70 (Screw) ×4 IMPLANT
SCREW LOCK CORT STAR 3.5X75 (Screw) ×1 IMPLANT
SCREW LP 3.5X65MM (Screw) ×2 IMPLANT
SPONGE LAP 18X18 X RAY DECT (DISPOSABLE) ×2 IMPLANT
STAPLER VISISTAT 35W (STAPLE) ×2 IMPLANT
STOCKINETTE IMPERVIOUS LG (DRAPES) ×2 IMPLANT
SUCTION FRAZIER HANDLE 10FR (MISCELLANEOUS) ×1
SUCTION TUBE FRAZIER 10FR DISP (MISCELLANEOUS) ×1 IMPLANT
SUT ETHILON 3 0 PS 1 (SUTURE) IMPLANT
SUT PROLENE 0 CT 2 (SUTURE) ×4 IMPLANT
SUT VIC AB 0 CT1 27 (SUTURE) ×2
SUT VIC AB 0 CT1 27XBRD ANBCTR (SUTURE) ×1 IMPLANT
SUT VIC AB 1 CT1 27 (SUTURE) ×2
SUT VIC AB 1 CT1 27XBRD ANBCTR (SUTURE) ×1 IMPLANT
SUT VIC AB 2-0 CT1 27 (SUTURE) ×4
SUT VIC AB 2-0 CT1 TAPERPNT 27 (SUTURE) ×2 IMPLANT
SYR 5ML LL (SYRINGE) ×3 IMPLANT
TOWEL OR 17X24 6PK STRL BLUE (TOWEL DISPOSABLE) ×2 IMPLANT
TOWEL OR 17X26 10 PK STRL BLUE (TOWEL DISPOSABLE) ×4 IMPLANT
TRAY FOLEY W/METER SILVER 16FR (SET/KITS/TRAYS/PACK) IMPLANT
TUBE CONNECTING 12X1/4 (SUCTIONS) ×2 IMPLANT
WATER STERILE IRR 1000ML POUR (IV SOLUTION) ×4 IMPLANT
WIRE K 1.6MM 144256 (MISCELLANEOUS) ×3 IMPLANT
YANKAUER SUCT BULB TIP NO VENT (SUCTIONS) ×2 IMPLANT

## 2017-06-22 NOTE — Progress Notes (Signed)
PT Cancellation Note  Patient Details Name: Marissa Gray MRN: [id number] DOB: 10-04-1981   Cancelled Treatment:    Reason Eval/Treat Not Completed: Patient not medically ready (continue to await surgical fixation to proceed with evaluation)   Rohin Krejci B Camdan Burdi 06/22/2017, 7:18 AM  Delaney Meigs, PT [phone]

## 2017-06-22 NOTE — Progress Notes (Signed)
OT Cancellation    06/22/17 0700  OT Visit Information  Last OT Received On 06/22/17  Reason Eval/Treat Not Completed Patient not medically ready. Pt is planned for ORIF by Dr. Sherlean Foot on Tues 06/22/17. Acute OT will return as appropriate s/p surgery. Thank you.   Selby Foisy MSOT, OTR/L Acute Rehab Pager: [phone] Office: [phone]

## 2017-06-22 NOTE — Progress Notes (Signed)
Patient alert and oriented to person, place, event, and time. Patient able to communicate the two procedures she in detail without prompting. Patient signed consent form.

## 2017-06-22 NOTE — Brief Op Note (Signed)
06/20/2017 - 06/22/2017  3:28 PM  PATIENT:  Marissa Gray  36 y.o. female  PRE-OPERATIVE DIAGNOSIS:   1. RIGHT TIBIA PLATEAU FRACTURE, SUSPICIOUS FOR CHRONIC NONUNION 2. LEFT KNEE DEGENERATIVE JOINT DISEASE  POST-OPERATIVE DIAGNOSIS:   1. 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 LEFT KNEE DEGENERATIVE JOINT DISEASE  PROCEDURE:  Procedure(s): 1. OPEN REDUCTION INTERNAL FIXATION (ORIF) BICONDYLAR TIBIAL PLATEAU (Right) WITH EXTENSIVE GRAFTING, MULTIPLE INFUSE SPONGES AND CANCELLOUS ALLOGRAFT 2. ANTERIOR COMPARTMENT FASCIOTOMY 3. LEFT KNEE STEROID INJECTION  SURGEON:  Surgeon(s) and Role:    Myrene Galas, MD - Primary  PHYSICIAN ASSISTANT: Montez Morita, PA-C  ANESTHESIA:   general  EBL:  Total I/O In: 1100 [I.V.:1100] Out: 150 [Blood:150]  BLOOD ADMINISTERED:none  DRAINS: none   LOCAL MEDICATIONS USED:  MARCAINE     SPECIMEN:  No Specimen  DISPOSITION OF SPECIMEN:  N/A  COUNTS:  YES  TOURNIQUET:  * No tourniquets in log *  DICTATION: .Other Dictation: Dictation Number [id number]  PLAN OF CARE: Admit to inpatient   PATIENT DISPOSITION:  PACU - hemodynamically stable.   Delay start of Pharmacological VTE agent (>24hrs) due to surgical blood loss or risk of bleeding: no

## 2017-06-22 NOTE — Anesthesia Postprocedure Evaluation (Signed)
Anesthesia Post Note  Patient: [name]  Procedure(s) Performed: Procedure(s) (LRB): OPEN REDUCTION INTERNAL FIXATION (ORIF) TIBIAL PLATEAU (Right)     Patient location during evaluation: PACU Anesthesia Type: General Level of consciousness: awake and alert Pain management: pain level controlled Vital Signs Assessment: post-procedure vital signs reviewed and stable Respiratory status: spontaneous breathing, nonlabored ventilation, respiratory function stable and patient connected to nasal cannula oxygen Cardiovascular status: blood pressure returned to baseline and stable Postop Assessment: no signs of nausea or vomiting Anesthetic complications: no    Last Vitals:  Vitals:   06/22/17 1545 06/22/17 1558  BP: (!) 151/98   Pulse: 97 100  Resp: 16 16  Temp:  36.4 C    Last Pain:  Vitals:   06/22/17 0605  TempSrc: Oral  PainSc:     LLE Motor Response: Responds to commands;Purposeful movement (06/22/17 1558) LLE Sensation: No tingling;No pain;No numbness (06/22/17 1558) RLE Motor Response: Responds to sound (06/22/17 1558) RLE Sensation: No tingling;No pain;No numbness (06/22/17 1558)      Marissa Gray

## 2017-06-22 NOTE — Anesthesia Preprocedure Evaluation (Addendum)
Anesthesia Evaluation  Patient identified by MRN, date of birth, ID band Patient awake    Reviewed: Allergy & Precautions, NPO status , Patient's Chart, lab work & pertinent test results  Airway Mallampati: III  TM Distance: >3 FB Neck ROM: Full  Mouth opening: Limited Mouth Opening  Dental no notable dental hx.    Pulmonary neg pulmonary ROS,    Pulmonary exam normal breath sounds clear to auscultation       Cardiovascular negative cardio ROS Normal cardiovascular exam Rhythm:Regular Rate:Normal     Neuro/Psych negative neurological ROS  negative psych ROS   GI/Hepatic negative GI ROS, Neg liver ROS,   Endo/Other  negative endocrine ROS  Renal/GU negative Renal ROS  negative genitourinary   Musculoskeletal negative musculoskeletal ROS (+)   Abdominal   Peds negative pediatric ROS (+)  Hematology negative hematology ROS (+)   Anesthesia Other Findings   Reproductive/Obstetrics negative OB ROS                            Anesthesia Physical Anesthesia Plan  ASA: III  Anesthesia Plan: General   Post-op Pain Management:    Induction: Intravenous  PONV Risk Score and Plan: 2 and Ondansetron and Dexamethasone  Airway Management Planned: LMA  Additional Equipment:   Intra-op Plan:   Post-operative Plan:   Informed Consent:   Plan Discussed with: CRNA and Surgeon  Anesthesia Plan Comments: ( )       Anesthesia Quick Evaluation

## 2017-06-22 NOTE — Op Note (Signed)
NAMEANAELLE, DUNTON               ACCOUNT NO.:  [id number]  MEDICAL RECORD NO.:  [id number]  LOCATION:  [id number]                        FACILITY:  [facility]  PHYSICIAN:  Doralee Albino. Carola Frost, M.D. DATE OF BIRTH:  07/14/1981  DATE OF PROCEDURE:  06/22/2017 DATE OF DISCHARGE:                              OPERATIVE REPORT   PREOPERATIVE DIAGNOSES: 1. Right bicondylar tibial plateau fracture, suspicious for chronic     nonunion. 2. Left knee degenerative joint disease.  POSTOPERATIVE DIAGNOSES: 1. Right tibia bicondylar plateau fracture, acute on chronic. 2. Left knee degenerative joint disease.  PROCEDURES: 1. Open reduction and internal fixation of bicondylar tibial plateau     fracture with extensive bone grafting using multiple Infuse sponges     and cancellous allograft. 2. Anterior compartment fasciotomy. 3. Left knee steroid injection.  SURGEON:  Doralee Albino. Carola Frost, M.D.  ASSISTANT:  Montez Morita, PA-C.  ANESTHESIA:  General.  COMPLICATIONS:  None.  TOURNIQUET:  None.  I/O:  In 1100 mL.  EBL, 150 mL.  DISPOSITION:  To PACU.  CONDITION:  Stable.  BRIEF SUMMARY AND INDICATION FOR PROCEDURE:  Marissa Gray is a 36 year old female with Down syndrome who presented over the weekend with acute pain, swelling of the right knee, and inability to bear weight.  She was seen the day after acute injury at the outside facility where plain films indicated a bicondylar plateau fracture.  She returned to her group home, was unable to tolerate that and the director brought her back to the hospital where plain films and CT scan showed extensive shortening and some chronic changes particularly on the CT suggestive of much longer term fracture as there is sclerosis along the bone ends and some space.  There was definitely soft tissue evidence, however, of acute injury.  I did discuss the risks and benefits of surgical treatment of this with the patient, her mother who was in attendance  as well as the group home director and recommended internal fixation with restoration of length and bone grafting.  Risks discussed included a heart attack, stroke, anesthetic reaction, nerve injury, vessel injury, DVT, PE, arthritis, need for further surgery, and symptomatic hardware. A full discussion of these risks were acknowledged and the decision made to proceed, with consent obtained.  BRIEF SUMMARY OF PROCEDURE:  The patient received preoperative antibiotics, taken to the operating room, where general anesthesia was induced.  The right lower extremity was prepped and draped in usual sterile fashion.  Because of preoperative C-spine workup in atlantoaxial instability associated with Down syndrome patients, plain film and CT scan were obtained including flexion and extension views and the spinal precautions were used throughout by Anesthesia who is well aware of this potential.  The patient tolerated this sedation quite well followed by intubation.  The right lower extremity was prepped and draped in usual sterile fashion.  We began with a curvilinear incision.  Dissection carried down to the edge of the joint line where a plate was placed to check for position and length if we intended to do a closed reduction with restoration of length if this was indeed an entirely acute fracture.  When the C-arm was brought in, it was  readily apparent that this definitely had a subacute or chronic component to it and most likely represented a nonunion of some type with acute fracture as well. There was a gap of greater than 80 __________, confirmed by x-ray. Consequently at that time, I continued with the dissection as I rounded the anterolateral aspect of the tibia.  I encountered a large amount of fluid consistent with a pseudarthrosis again suggesting nonunion.  This was evacuated.  There was no evidence of evidence of purulence whatsoever as it was entirely clear.  The shaft was exposed  proximally, just the minimal amount of insertion as possible was removed from the anterolateral tibia.  The retinacular attachments along the subchondral rim were left entirely intact.  Through the fracture site, I was able to introduce lamina spreader and gained access into the enormous metaphyseal defect while my assistant pulled traction.  Using a curette, I completely debrided the cavity, getting back to not healthy cancellous bone.  This area was irrigated thoroughly and then, we began in stepwise fashion to place graft.  We used over 80 mL of cancellous chips and multiple Infuse sponges.  We attempted to use 2 and this came about by using the 1 large Infuse sponge in 40 mL and only getting half of the defect filled as we attempted to open the second.  The package was thought to be contaminated and consequently, we opened an additional Infuse sponge.  At that time, we then became aware that there was an inner seal and that the Infuse could be removed from it while maintaining complete sterility but just discarding the package and so in the end, we ended up with 3 Infuse sponges.  The cancellous graft was placed within this using a burrito-type technique and discrete packets of cancellous chips and Infuse were packed in stepwise fashion throughout the defect getting ultimately outstanding fill which was well visualized on C-arm.  Then, we followed this with the Biomet plate, initially securing fixation in standard fashion at the proximal fragment and then in the shaft.  This was followed by additional lock fixation proximally and a lock screw in the most proximal hole of the shaft. Final images showed restoration of condylar width, alignment, fill of the defect, and no complications.  Wound was irrigated thoroughly. Montez Morita, PA-C, assisted me throughout.  I then turned attention to the distal end of the wound where the long scissors were used to spread both superficial and deep to  the anterior compartment fascia.  A 10-cm fasciotomy was then performed staying well medial to the superficial peroneal nerve.  Standard layered closure was then performed, and sterile gently compressive dressings applied from foot to thigh.  Knee immobilizer was reapplied.  The patient was awakened from anesthesia and transported to the PACU in stable condition.  Montez Morita, PA-C, did assist me throughout.  Should also be noted that prior to going back to the PACU, a sterile prep was performed of the left knee and 40 mg of Depo-Medrol mixed with Marcaine was introduced into the left knee joint.  This had been discussed with the patient's rheumatologist as knee joint injection was scheduled and could be done while the patient was asleep in the OR.  PROGNOSIS:  This injury again is acute on chronic in nature, and given the enormity of the metaphyseal defect, we would expect a prolonged healing time, possibly 3 months or more, and the risk of nonunion is certainly greater. She will have unrestricted range of motion immediately,  and we are hopeful that she will be compliant with weightbearing restrictions.  She can be restarted on DVT prophylaxis and we would expect that to continue.  A skilled nursing facility placement is anticipated at this time.     Doralee Albino. Carola Frost, M.D.     MHH/MEDQ  D:  06/22/2017  T:  06/22/2017  Job:  [id number]

## 2017-06-22 NOTE — Anesthesia Procedure Notes (Signed)
Procedure Name: Intubation Date/Time: 06/22/2017 12:27 PM Performed by: Little Ishikawa L Pre-anesthesia Checklist: Patient identified, Emergency Drugs available, Suction available and Patient being monitored Patient Re-evaluated:Patient Re-evaluated prior to inductionOxygen Delivery Method: Circle System Utilized and Circle system utilized Preoxygenation: Pre-oxygenation with 100% oxygen Intubation Type: IV induction Ventilation: Mask ventilation without difficulty Laryngoscope Size: Glidescope and 3 Grade View: Grade I Tube type: Oral Tube size: 6.5 mm Number of attempts: 1 Airway Equipment and Method: Stylet and Oral airway Placement Confirmation: ETT inserted through vocal cords under direct vision,  positive ETCO2 and breath sounds checked- equal and bilateral Secured at: 19 cm Tube secured with: Tape Dental Injury: Teeth and Oropharynx as per pre-operative assessment  Difficulty Due To: Difficulty was anticipated, Difficult Airway- due to large tongue and Difficult Airway-  due to neck instability Comments: Elective glidescope intubation due to C1,C2 instability. Head and neck neutral, midline, and cervical stability maintained during intubation

## 2017-06-22 NOTE — Transfer of Care (Signed)
Immediate Anesthesia Transfer of Care Note  Patient: Marissa Gray  Procedure(s) Performed: Procedure(s): OPEN REDUCTION INTERNAL FIXATION (ORIF) TIBIAL PLATEAU (Right)  Patient Location: PACU  Anesthesia Type:General  Level of Consciousness: awake and alert   Airway & Oxygen Therapy: Patient Spontanous Breathing and Patient connected to nasal cannula oxygen  Post-op Assessment: Report given to RN, Post -op Vital signs reviewed and stable and Patient moving all extremities X 4  Post vital signs: Reviewed and stable  Last Vitals:  Vitals:   06/22/17 0605 06/22/17 1520  BP: 114/66 (P) 137/89  Pulse: 87   Resp: 18 (P) 17  Temp: 36.9 C (P) 37.1 C    Last Pain:  Vitals:   06/22/17 0605  TempSrc: Oral  PainSc:          Complications: No apparent anesthesia complications

## 2017-06-22 NOTE — Clinical Social Work Note (Signed)
Clinical Social Work Assessment  Patient Details  Name: Marissa Gray MRN: [id number] Date of Birth: 1981/10/13  Date of referral:  06/22/17               Reason for consult:  Facility Placement                Permission sought to share information with:  Facility Industrial/product designer granted to share information::  No  Name::     mom is guardian and provided verbal permission  Agency::  SNF  Relationship::     Contact Information:     Housing/Transportation Living arrangements for the past 2 months:  Group Home Source of Information:  Parent Patient Interpreter Needed:  None Criminal Activity/Legal Involvement Pertinent to Current Situation/Hospitalization:  No - Comment as needed Significant Relationships:  Other Family Members, Parents Lives with:  Other (Comment) (Group Home Residents) Do you feel safe going back to the place where you live?  No Need for family participation in patient care:  Yes (Comment)  Care giving concerns:  Patient resided at [facility] in [place]. Pt not safe to return home as group home does not provide rehabilitation assistance.   Social Worker assessment / plan:  CSW contacted mom to introduce self and discuss clinical team's plan at [facility] for skilled nursing. Mom in agreement. CSW explained her role and discussed SNF options and placement. Mom indicated that she wanted patient at [facility] in [place] and confirmed a contact in facility indicated that they could take patient. CSW obtained verbal permission to send out offers to local area including [place] and [place] for SNF offers.   CSW will f/u on FL2, passr pending and send out offers once surgery completed.  Employment status:  Disabled (Comment on whether or not currently receiving Disability) Insurance information:  Medicaid In [place] PT Recommendations:  Skilled Nursing Facility Information / Referral to community resources:  Skilled Nursing  Facility  Patient/Family's Response to care:  Family has no issues with care. Family appreciative of CSW contact and assistance.  Patient/Family's Understanding of and Emotional Response to Diagnosis, Current Treatment, and Prognosis:  Family has good understanding of diagnosis, current treatment and prognosis. Family hopeful that rehabilitation will address impairment. No issues or concerns at this time.  Emotional Assessment Appearance:  Appears stated age Attitude/Demeanor/Rapport:   (Cooperative) Affect (typically observed):  Accepting, Appropriate Orientation:  Oriented to Self, Oriented to Place, Oriented to  Time, Oriented to Situation Alcohol / Substance use:  Never Used Psych involvement (Current and /or in the community):  No (Comment)  Discharge Needs  Concerns to be addressed:  Care Coordination Readmission within the last 30 days:  No Current discharge risk:  Physical Impairment, Dependent with Mobility Barriers to Discharge:  No Barriers Identified   Tresa Moore, LCSW 06/22/2017, 4:11 PM

## 2017-06-23 ENCOUNTER — Encounter (HOSPITAL_COMMUNITY): Payer: Self-pay | Admitting: Orthopedic Surgery

## 2017-06-23 DIAGNOSIS — M532X1 Spinal instabilities, occipito-atlanto-axial region: Secondary | ICD-10-CM

## 2017-06-23 HISTORY — DX: Spinal instabilities, occipito-atlanto-axial region: M53.2X1

## 2017-06-23 LAB — BASIC METABOLIC PANEL
Anion gap: 7 (ref 5–15)
BUN: 6 mg/dL (ref 6–20)
CO2: 22 mmol/L (ref 22–32)
Calcium: 7.9 mg/dL — ABNORMAL LOW (ref 8.9–10.3)
Chloride: 109 mmol/L (ref 101–111)
Creatinine, Ser: 0.58 mg/dL (ref 0.44–1.00)
GFR calc Af Amer: >60 mL/min (ref >60)
GFR calc non Af Amer: >60 mL/min (ref >60)
Glucose, Bld: 123 mg/dL — ABNORMAL HIGH (ref 65–99)
Potassium: 4.6 mmol/L (ref 3.5–5.1)
Sodium: 138 mmol/L (ref 135–145)

## 2017-06-23 LAB — CBC
HCT: 29.2 % — ABNORMAL LOW (ref 36.0–46.0)
Hemoglobin: 9.7 g/dL — ABNORMAL LOW (ref 12.0–15.0)
MCH: 38 pg — ABNORMAL HIGH (ref 26.0–34.0)
MCHC: 33.2 g/dL (ref 30.0–36.0)
MCV: 114.5 fL — ABNORMAL HIGH (ref 78.0–100.0)
Platelets: 244 10*3/uL (ref 150–400)
RBC: 2.55 MIL/uL — ABNORMAL LOW (ref 3.87–5.11)
RDW: 14.3 % (ref 11.5–15.5)
WBC: 9.6 10*3/uL (ref 4.0–10.5)

## 2017-06-23 LAB — T4, FREE: Free T4: 0.88 ng/dL (ref 0.61–1.12)

## 2017-06-23 MED ORDER — ACETAMINOPHEN 500 MG PO TABS
1000.0000 mg | ORAL_TABLET | Freq: Four times a day (QID) | ORAL | Status: DC
  Administered 2017-06-23 – 2017-06-25 (×6): 1000 mg via ORAL
  Filled 2017-06-23 (×7): qty 2

## 2017-06-23 NOTE — Evaluation (Signed)
Physical Therapy Evaluation Patient Details Name: Marissa Gray MRN: [id number] DOB: Jul 12, 1981 Today's Date: 06/23/2017   History of Present Illness  Pt is 36 yo female with Downs Syndrome who fell at her group home and was found to have R tibial plateau fx. Underwent ORIF as well as anterior fasciotomy and left knee steroid injection. Pt has h/o OA and foot triple arthrodesis. Has a mental age of around a 4th grader per her mother.   Clinical Impression  Pt admitted with above diagnosis. Pt currently with functional limitations due to the deficits listed below (see PT Problem List). Pt limited on eval by anxiety. Required mod A +2 for bed mobility and max A +2 for transfer to chair.  Pt will benefit from skilled PT to increase their independence and safety with mobility to allow discharge to the venue listed below.      Follow Up Recommendations SNF;Supervision/Assistance - 24 hour    Equipment Recommendations  Rolling walker with 5" wheels;Wheelchair (measurements PT);Other (comment) (w/c with elevating R leg rest)    Recommendations for Other Services       Precautions / Restrictions Precautions Precautions: Fall Required Braces or Orthoses: Knee Immobilizer - Right Knee Immobilizer - Right: On at all times Restrictions Weight Bearing Restrictions: Yes RLE Weight Bearing: Non weight bearing      Mobility  Bed Mobility Overal bed mobility: Needs Assistance Bed Mobility: Supine to Sit     Supine to sit: Mod assist;+2 for physical assistance     General bed mobility comments: pt unable to move LE's to EOB, needed total assist for legs, but was able to initiate rolling and manage trunk once LE's off EOB. Assit from front with legs and from behind for stability  Transfers Overall transfer level: Needs assistance Equipment used: Rolling walker (2 wheeled);None Transfers: Sit to/from [employer] Sit to Stand: Max assist;+2 physical assistance Stand pivot  transfers: Max assist;+2 physical assistance       General transfer comment: pt very anxious EOB, would not attempt standing to RW and in fact was resisting and leaning back when fwd wt shift initiated. Did better with 2 person assist and stood partially but still very anxious and did not want to stand all the way up. Her [facility] worker was in the room and she requested him in front of her. With him holding her hands, she stood with PT and OT on each side and was able to hop to chair and sit  Ambulation/Gait             General Gait Details: unable at this point  Stairs            Wheelchair Mobility    Modified Rankin (Stroke Patients Only)       Balance                                             Pertinent Vitals/Pain Pain Assessment: Faces Faces Pain Scale: Hurts little more Pain Location: left LE and RLE Pain Descriptors / Indicators: Aching;Operative site guarding;Sore Pain Intervention(s): Limited activity within patient's tolerance;Monitored during session;Premedicated before session    Home Living Family/patient expects to be discharged to:: Skilled nursing facility                 Additional Comments: cannot go back to [facility] until she is more independent  Prior Function Level of Independence: Needs assistance   Gait / Transfers Assistance Needed: independent  ADL's / Homemaking Assistance Needed: needed help with perineal care and occasional assist with putting on shoes when her kneed bothered her        Hand Dominance        Extremity/Trunk Assessment   Upper Extremity Assessment Upper Extremity Assessment: Defer to OT evaluation    Lower Extremity Assessment Lower Extremity Assessment: LLE deficits/detail LLE Deficits / Details: pt has left knee pain at baseline, received steroid injection in sx, which limits ability to stand solely on that leg    Cervical / Trunk Assessment Cervical / Trunk Assessment:  Normal  Communication   Communication: No difficulties  Cognition Arousal/Alertness: Awake/alert Behavior During Therapy: Anxious Overall Cognitive Status: History of cognitive impairments - at baseline                                        General Comments General comments (skin integrity, edema, etc.): step was placed under pt's feet because her legs are very short and she cannot reach the floor. RN requesting bed that goes to floor which will really help with transfers    Exercises     Assessment/Plan    PT Assessment Patient needs continued PT services  PT Problem List Decreased strength;Decreased range of motion;Decreased activity tolerance;Decreased balance;Decreased mobility;Decreased coordination;Decreased cognition;Decreased knowledge of use of DME;Decreased knowledge of precautions;Pain;Obesity       PT Treatment Interventions DME instruction;Gait training;Functional mobility training;Therapeutic activities;Therapeutic exercise;Balance training;Cognitive remediation;Patient/family education    PT Goals (Current goals can be found in the Care Plan section)  Acute Rehab PT Goals Patient Stated Goal: get better PT Goal Formulation: With patient Time For Goal Achievement: 07/07/17 Potential to Achieve Goals: Good    Frequency Min 3X/week   Barriers to discharge Decreased caregiver support group home unable to provide skilled care    Co-evaluation PT/OT/SLP Co-Evaluation/Treatment: Yes Reason for Co-Treatment: Necessary to address cognition/behavior during functional activity;For patient/therapist safety PT goals addressed during session: Mobility/safety with mobility;Balance;Proper use of DME         AM-PAC PT "6 Clicks" Daily Activity  Outcome Measure Difficulty turning over in bed (including adjusting bedclothes, sheets and blankets)?: Total Difficulty moving from lying on back to sitting on the side of the bed? : Total Difficulty sitting down  on and standing up from a chair with arms (e.g., wheelchair, bedside commode, etc,.)?: Total Help needed moving to and from a bed to chair (including a wheelchair)?: A Lot Help needed walking in hospital room?: Total Help needed climbing 3-5 steps with a railing? : Total 6 Click Score: 7    End of Session Equipment Utilized During Treatment: Gait belt;Right knee immobilizer Activity Tolerance: Other (comment) (limited secondary to anxiety) Patient left: in chair;with call bell/phone within reach;with family/visitor present Nurse Communication: Mobility status PT Visit Diagnosis: Unsteadiness on feet (R26.81);Muscle weakness (generalized) (M62.81);History of falling (Z91.81);Difficulty in walking, not elsewhere classified (R26.2);Pain Pain - Right/Left: Right Pain - part of body: Knee    Time: 0912-0937 PT Time Calculation (min) (ACUTE ONLY): 25 min   Charges:   PT Evaluation $PT Eval Moderate Complexity: 1 Procedure     PT G Codes:        Lyanne Co, PT  Acute Rehab Services  [phone]   Bellaire L Leno Mathes 06/23/2017, 10:34 AM

## 2017-06-23 NOTE — Progress Notes (Signed)
Physical Therapy Treatment Patient Details Name: TANAKA GILLEN MRN: [id number] DOB: [birth year] Today's Date: 06/23/2017    History of Present Illness Pt is 36 yo female with Downs Syndrome who fell at her group home and was found to have R tibial plateau fx. Underwent ORIF as well as anterior fasciotomy and left knee steroid injection. Pt has h/o OA and foot triple arthrodesis. Has a mental age of around a 4th grader per her mother.     PT Comments    Pt still anxious with mobility but less than in morning. Mod A +2 for sit to stand and hop to bed. Max A +2 for return to supine, partially due to height of bed. Portable equipment planning to bring a bed that will go lower to the floor. PT will continue to follow.    Follow Up Recommendations  SNF;Supervision/Assistance - 24 hour     Equipment Recommendations  Rolling walker with 5" wheels;Wheelchair (measurements PT);Other (comment) (w/c with elevating R leg rest)    Recommendations for Other Services       Precautions / Restrictions Precautions Precautions: Fall Required Braces or Orthoses: Knee Immobilizer - Right Knee Immobilizer - Right: On at all times Restrictions Weight Bearing Restrictions: Yes RLE Weight Bearing: Non weight bearing    Mobility  Bed Mobility Overal bed mobility: Needs Assistance Bed Mobility: Sit to Supine       Sit to supine: Max assist;+2 for physical assistance   General bed mobility comments: Pt unable to lift legs to EOB but once up to bed was able to hold RLE up during positioning. Max A +2 at trunks and hips for sliding back into bed and moving down to back.   Transfers Overall transfer level: Needs assistance Equipment used: None Transfers: Sit to/from [employer] Sit to Stand: +2 physical assistance;Mod assist Stand pivot transfers: +2 physical assistance;Mod assist       General transfer comment: arm in arm assist, mod A +2 for boost and support while pt took small  hops to EOB  Ambulation/Gait             General Gait Details: unable at this point   Stairs            Wheelchair Mobility    Modified Rankin (Stroke Patients Only)       Balance                                            Cognition Arousal/Alertness: Awake/alert Behavior During Therapy: Anxious Overall Cognitive Status: History of cognitive impairments - at baseline                                        Exercises      General Comments General comments (skin integrity, edema, etc.): called portable equipment to get lower bed as the height of this one making it very difficult to get on and off      Pertinent Vitals/Pain Pain Assessment: Faces Faces Pain Scale: Hurts little more Pain Location: RLE Pain Descriptors / Indicators: Aching;Operative site guarding;Sore Pain Intervention(s): Limited activity within patient's tolerance;Monitored during session;Premedicated before session    Home Living  Prior Function            PT Goals (current goals can now be found in the care plan section) Acute Rehab PT Goals Patient Stated Goal: get better PT Goal Formulation: With patient Time For Goal Achievement: 07/07/17 Potential to Achieve Goals: Good Progress towards PT goals: Progressing toward goals    Frequency    Min 3X/week      PT Plan Current plan remains appropriate    Co-evaluation              AM-PAC PT "6 Clicks" Daily Activity  Outcome Measure  Difficulty turning over in bed (including adjusting bedclothes, sheets and blankets)?: Total Difficulty moving from lying on back to sitting on the side of the bed? : Total Difficulty sitting down on and standing up from a chair with arms (e.g., wheelchair, bedside commode, etc,.)?: Total Help needed moving to and from a bed to chair (including a wheelchair)?: A Lot Help needed walking in hospital room?: Total Help needed  climbing 3-5 steps with a railing? : Total 6 Click Score: 7    End of Session Equipment Utilized During Treatment: Gait belt;Right knee immobilizer Activity Tolerance: Patient tolerated treatment well Patient left: with call bell/phone within reach;with family/visitor present;in bed Nurse Communication: Mobility status PT Visit Diagnosis: Unsteadiness on feet (R26.81);Muscle weakness (generalized) (M62.81);History of falling (Z91.81);Difficulty in walking, not elsewhere classified (R26.2);Pain Pain - Right/Left: Right Pain - part of body: Knee     Time: 3149-7026 PT Time Calculation (min) (ACUTE ONLY): 15 min  Charges:  $Therapeutic Activity: 8-22 mins                    G Codes:       Lyanne Co, PT  Acute Rehab Services  [phone]    Lawana Chambers Karley Pho 06/23/2017, 3:32 PM

## 2017-06-23 NOTE — NC FL2 (Signed)
[place] MEDICAID FL2 LEVEL OF CARE SCREENING TOOL     IDENTIFICATION  Patient Name: Marissa Gray Birthdate: 11-Jun-1981 Sex: female Admission Date (Current Location): 06/20/2017  [facility] and [place] Number:  Producer, television/film/video and Address:  [facility] [place], [address], [place], [place] [address]      Provider Number: [id number]  Attending Physician Name and Address:  Myrene Galas, MD  Relative Name and Phone Number:       Current Level of Care: Hospital Recommended Level of Care: Skilled Nursing Facility Prior Approval Number:    Date Approved/Denied: 06/22/17 PASRR Number: pending  Discharge Plan: SNF    Current Diagnoses: Patient Active Problem List   Diagnosis Date Noted  . 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 Down's syndrome   . Rheumatoid arthritis (HCC)   . Closed bicondylar fracture of right tibial plateau 06/20/2017    Orientation RESPIRATION BLADDER Height & Weight     Self, Time, Situation, Place  Normal Continent Weight: 201 lb 8 oz (91.4 kg) Height:     BEHAVIORAL SYMPTOMS/MOOD NEUROLOGICAL BOWEL NUTRITION STATUS      Continent Diet (See DC summary)  AMBULATORY STATUS COMMUNICATION OF NEEDS Skin   Extensive Assist Verbally Surgical wounds (Closed Right Leg Incision Compression WRap)                       Personal Care Assistance Level of Assistance  Bathing, Feeding, Dressing Bathing Assistance: Maximum assistance Feeding assistance: Independent Dressing Assistance: Maximum assistance     Functional Limitations Info  Sight, Hearing, Speech Sight Info: Adequate Hearing Info: Adequate Speech Info: Adequate    SPECIAL CARE FACTORS FREQUENCY  PT (By licensed PT), OT (By licensed OT)     PT Frequency: 5xweek OT Frequency: 5xweek            Contractures      Additional Factors Info  Code Status, Allergies Code Status Info: Full Allergies Info: CECLOR CEFACLOR, PENICILLINS, SULFA ANTIBIOTICS             Current Medications (06/23/2017):  This is the current hospital active medication list Current Facility-Administered Medications  Medication Dose Route Frequency Provider Last Rate Last Dose  . 0.9 % NaCl with KCl 20 mEq/ L  infusion   Intravenous Continuous Montez Morita, PA-C 50 mL/hr at 06/22/17 2047    . acetaminophen (OFIRMEV) IV 1,000 mg  1,000 mg Intravenous Q6H Montez Morita, PA-C   Stopped at 06/23/17 1149  . acetaminophen (TYLENOL) tablet 1,000 mg  1,000 mg Oral Q6H Montez Morita, PA-C      . albuterol (PROVENTIL) (2.5 MG/3ML) 0.083% nebulizer solution 3 mL  3 mL Inhalation Q4H PRN Albina Billet III, PA-C      . budesonide (PULMICORT) nebulizer solution 0.25 mg  0.25 mg Nebulization BID Hammons, Kimberly B, RPH   0.25 mg at 06/23/17 0737  . docusate sodium (COLACE) capsule 100 mg  100 mg Oral BID Albina Billet III, PA-C   100 mg at 06/23/17 1126  . enoxaparin (LOVENOX) injection 40 mg  40 mg Subcutaneous Q24H Montez Morita, PA-C   40 mg at 06/23/17 1125  . folic acid (FOLVITE) tablet 2 mg  2 mg Oral Daily Albina Billet III, PA-C   2 mg at 06/23/17 1126  . HYDROmorphone (DILAUDID) injection 0.25-0.5 mg  0.25-0.5 mg Intravenous Q3H PRN Montez Morita, PA-C      . ketotifen (ZADITOR) 0.025 % ophthalmic solution 1 drop  1 drop Both Eyes BID Albina Billet III, PA-C   1 drop at 06/23/17 1127  . levonorgestrel-ethinyl estradiol (AVIANE,ALESSE,LESSINA) 0.1-20 MG-MCG per tablet 1 tablet  1 tablet Oral Daily Martensen, Lucretia Kern III, PA-C      . magnesium citrate solution 1 Bottle  1 Bottle Oral Once PRN Albina Billet III, PA-C      . methocarbamol (ROBAXIN) tablet 500 mg  500 mg Oral Q6H PRN Albina Billet III, PA-C   500 mg at 06/22/17 1640   Or  . methocarbamol (ROBAXIN) 500 mg in dextrose 5 % 50 mL IVPB  500 mg Intravenous Q6H PRN Albina Billet III, PA-C      . metoCLOPramide (REGLAN) tablet 5-10 mg  5-10 mg Oral Q8H PRN  Montez Morita, PA-C       Or  . metoCLOPramide (REGLAN) injection 5-10 mg  5-10 mg Intravenous Q8H PRN Montez Morita, PA-C      . montelukast (SINGULAIR) tablet 10 mg  10 mg Oral QPM Albina Billet III, PA-C   10 mg at 06/22/17 1640  . mupirocin ointment (BACTROBAN) 2 % 1 application  1 application Topical Daily Albina Billet III, PA-C   1 application at 06/23/17 1128  . ondansetron (ZOFRAN) tablet 4 mg  4 mg Oral Q6H PRN Montez Morita, PA-C       Or  . ondansetron [facility]) injection 4 mg  4 mg Intravenous Q6H PRN Montez Morita, PA-C      . oxyCODONE (Oxy IR/ROXICODONE) immediate release tablet 5-10 mg  5-10 mg Oral Q6H PRN Albina Billet III, PA-C   10 mg at 06/23/17 1147  . pantoprazole (PROTONIX) EC tablet 40 mg  40 mg Oral Daily Albina Billet III, PA-C   40 mg at 06/23/17 1126  . polyethylene glycol (MIRALAX / GLYCOLAX) packet 17 g  17 g Oral Daily PRN Albina Billet III, PA-C      . potassium chloride (K-DUR,KLOR-CON) CR tablet 10 mEq  10 mEq Oral BID Albina Billet III, PA-C   10 mEq at 06/23/17 1126  . psyllium (HYDROCIL/METAMUCIL) packet 1 packet  1 packet Oral Daily Albina Billet III, PA-C   1 packet at 06/23/17 1127  . senna (SENOKOT) tablet 8.6 mg  1 tablet Oral BID Albina Billet III, PA-C   8.6 mg at 06/23/17 1125  . sorbitol 70 % solution 30 mL  30 mL Oral Daily PRN Albina Billet III, PA-C         Discharge Medications: Please see discharge summary for a list of discharge medications.  Relevant Imaging Results:  Relevant Lab Results:   Additional Information SS: [id number]  Tresa Moore, LCSW

## 2017-06-23 NOTE — Progress Notes (Signed)
Orthopedic Trauma Service Progress Note    Subjective:  Doing extremely well No real complaints R leg is sore but more worried about food than her leg   No neck pain   Pt and family would like to go to [facility] [place] for skilled care    Review of Systems  Constitutional: Negative for chills and fever.  Respiratory: Negative for shortness of breath.   Cardiovascular: Negative for chest pain.    Objective:   VITALS:   Vitals:   06/22/17 2104 06/23/17 0012 06/23/17 0429 06/23/17 0740  BP: 103/80 (!) 106/59 112/68   Pulse: 88 84 79   Resp: 18 16 16    Temp: 98.2 F (36.8 C) 97.8 F (36.6 C) 98 F (36.7 C)   TempSrc: Oral Oral Oral   SpO2: 98% 97% 96% 100%  Weight:        Intake/Output      06/26 0701 - 06/27 0700 06/27 0701 - 06/28 0700   P.O. 480    I.V. (mL/kg) 1100 (12)    Total Intake(mL/kg) 1580 (17.3)    Urine (mL/kg/hr) 0 (0)    Blood 150 (0.1)    Total Output 150     Net +1430          Urine Occurrence 3 x      LABS  Results for orders placed or performed during the hospital encounter of 06/20/17 (from the past 24 hour(s))  Basic metabolic panel     Status: Abnormal   Collection Time: 06/23/17  4:59 AM  Result Value Ref Range   Sodium 138 135 - 145 mmol/L   Potassium 4.6 3.5 - 5.1 mmol/L   Chloride 109 101 - 111 mmol/L   CO2 22 22 - 32 mmol/L   Glucose, Bld 123 (H) 65 - 99 mg/dL   BUN 6 6 - 20 mg/dL   Creatinine, Ser 06/25/17 0.44 - 1.00 mg/dL   Calcium 7.9 (L) 8.9 - 10.3 mg/dL   GFR calc non Af Amer >60 >60 mL/min   GFR calc Af Amer >60 >60 mL/min   Anion gap 7 5 - 15  CBC     Status: Abnormal   Collection Time: 06/23/17  4:59 AM  Result Value Ref Range   WBC 9.6 4.0 - 10.5 K/uL   RBC 2.55 (L) 3.87 - 5.11 MIL/uL   Hemoglobin 9.7 (L) 12.0 - 15.0 g/dL   HCT 06/25/17 (L) 49.4 - 49.6 %   MCV 114.5 (H) 78.0 - 100.0 fL   MCH 38.0 (H) 26.0 - 34.0 pg   MCHC 33.2 30.0 - 36.0 g/dL   RDW 75.9 16.3 - 84.6 %   Platelets  244 150 - 400 K/uL   Results for ARMONI, KLUDT (MRN Melonie [place]) as of 06/23/2017 08:26  Ref. Range 06/22/2017 06:07  TSH Latest Ref Range: 0.350 - 4.500 uIU/mL 8.153 (H)    PHYSICAL EXAM:   Gen: awake and alert, resting comfortably, NAD, appears well Lungs: CTA B  Cardiac: RRR Abd: + BS, NT Ext:       Right Lower Extremity   Dressing and immobilizer c/d/i  Ext warm  + DP pulse  DPN, SPN, TN sensation intact   EHL, FHL, lesser toe motor intact  No DCT    Compartments are soft   Assessment/Plan: 1 Day Post-Op   Principal Problem:   Closed bicondylar fracture of right tibial plateau Active Problems:   Down's syndrome   Rheumatoid arthritis (HCC)   Atlantoaxial instability  Anti-infectives    Start     Dose/Rate Route Frequency Ordered Stop   06/22/17 1845  clindamycin (CLEOCIN) IVPB 600 mg     600 mg 100 mL/hr over 30 Minutes Intravenous Every 6 hours 06/22/17 1619 06/23/17 1244   06/22/17 1030  clindamycin (CLEOCIN) IVPB 900 mg     900 mg 100 mL/hr over 30 Minutes Intravenous  Once 06/21/17 1137 06/22/17 1230    .  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   NWB x 8 weeks  Unrestricted ROM R knee  Ice and elevate  PT/OT  Dressing change tomorrow   Should be ok for SNF tomorrow or Friday                  - chronic L knee pain              s/p steroid injection in OR   60 mg depomedrol, 2 cc lidocaine 1% plain and 2 cc 0.5% marcaine plain  WBAT  ROM as tolerated   Ice prn    - Pain management:             continue with current regimen   Transition back to po tylenol  Has been on IV tylenol since surgery    - ABL anemia/Hemodynamics             Stable             Cbc in am    - Medical issues              Home meds continued               - DVT/PE  prophylaxis:             lovenox and scds  lovenox x 3 weeks post op                - ID:              periop abx   - Metabolic Bone Disease:             W/u ongoing    TSH is markedly elevated   May be related to acute injury and hospitalization but could also be suggestive of active thyroid dysfunction    Check t3 and t4   Will need follow up with PCP    - Activity:             NWB R leg    - FEN/GI prophylaxis/Foley/Lines:           Reg diet    - Dispo:  Therapy   Hopeful for snf tomorrow      Mearl Latin, PA-C [facility] [phone] (O) 06/23/2017, 8:29 AM

## 2017-06-23 NOTE — Evaluation (Signed)
Occupational Therapy Evaluation Patient Details Name: Marissa Gray MRN: [id number] DOB: 10/30/1981 Today's Date: 06/23/2017    History of Present Illness Pt is 36 yo female with Downs Syndrome who fell at her group home and was found to have R tibial plateau fx. Underwent ORIF as well as anterior fasciotomy and left knee steroid injection. Pt has h/o OA and foot triple arthrodesis. Has a mental age of around a 4th grader per her mother.    Clinical Impression   PTA, pt was living at a group home and performed basic ADLs with occasional assistance for toilet hygiene and LB dressing. Currently, pt requires Max A for LB ADLs, Mod A +2 for bed mobility, and Max +2 for functional mobility. Pt would benefit from acute OT to increase her independence and safety with ADLs and functional mobility. Recommend dc to SNF for further OT to optimize pt's occupational performance with ADLs and mobility.    Follow Up Recommendations  SNF;Supervision/Assistance - 24 hour    Equipment Recommendations  Other (comment) (Defer to next venue)    Recommendations for Other Services PT consult     Precautions / Restrictions Precautions Precautions: Fall Required Braces or Orthoses: Knee Immobilizer - Right Knee Immobilizer - Right: On at all times Restrictions Weight Bearing Restrictions: Yes RLE Weight Bearing: Non weight bearing      Mobility Bed Mobility Overal bed mobility: Needs Assistance Bed Mobility: Supine to Sit     Supine to sit: Mod assist;+2 for physical assistance Sit to supine: Max assist;+2 for physical assistance   General bed mobility comments: pt unable to move LE's to EOB, needed total assist for legs, but was able to initiate rolling and manage trunk once LE's off EOB. Assit from front with legs and from behind for stability  Transfers Overall transfer level: Needs assistance Equipment used: Rolling walker (2 wheeled);None Transfers: Sit to/from [employer] Sit to Stand: Max assist;+2 physical assistance Stand pivot transfers: Max assist;+2 physical assistance       General transfer comment: pt very anxious EOB, would not attempt standing to RW and in fact was resisting and leaning back when fwd wt shift initiated. Did better with 2 person assist and stood partially but still very anxious and did not want to stand all the way up. Her [facility] worker was in the room and she requested him in front of her. With him holding her hands, she stood with PT and OT on each side and was able to hop to chair and sit    Balance                                           ADL either performed or assessed with clinical judgement   ADL Overall ADL's : Needs assistance/impaired Eating/Feeding: Set up;Sitting   Grooming: Set up;Sitting   Upper Body Bathing: Minimal assistance;Sitting   Lower Body Bathing: Maximal assistance;Sit to/from stand   Upper Body Dressing : Minimal assistance;Sitting   Lower Body Dressing: Maximal assistance;Sit to/from stand   Toilet Transfer: Maximal assistance;+2 for physical assistance;Stand-pivot (Simulated to recliner)             General ADL Comments: Pt performing LB ADLs with Max A due to pain in B knees.  Very pleasant and fearful of moving for the first time.      Vision  Perception     Praxis      Pertinent Vitals/Pain Pain Assessment: Faces Faces Pain Scale: Hurts little more Pain Location: left LE and RLE Pain Descriptors / Indicators: Aching;Operative site guarding;Sore Pain Intervention(s): Monitored during session;Limited activity within patient's tolerance;Repositioned     Hand Dominance Right   Extremity/Trunk Assessment Upper Extremity Assessment Upper Extremity Assessment: Overall WFL for tasks assessed   Lower Extremity Assessment Lower Extremity Assessment: Defer to PT evaluation LLE Deficits / Details: pt has left knee pain at baseline, received  steroid injection in sx, which limits ability to stand solely on that leg   Cervical / Trunk Assessment Cervical / Trunk Assessment: Normal   Communication Communication Communication: No difficulties   Cognition Arousal/Alertness: Awake/alert Behavior During Therapy: Anxious Overall Cognitive Status: History of cognitive impairments - at baseline                                 General Comments: Pt with down syndrome diagnosis. Very sweet and was anxious to move for the first time   General Comments  called portable equipment to get lower bed as the height of this one making it very difficult to get on and off    Exercises     Shoulder Instructions      Home Living Family/patient expects to be discharged to:: Skilled nursing facility                                 Additional Comments: cannot go back to goup home until she is more independent      Prior Functioning/Environment Level of Independence: Needs assistance  Gait / Transfers Assistance Needed: independent ADL's / Homemaking Assistance Needed: needed help with perineal care and occasional assist with putting on shoes when her knees bothered her            OT Problem List: Decreased strength;Decreased range of motion;Decreased activity tolerance;Impaired balance (sitting and/or standing);Decreased cognition;Decreased safety awareness;Decreased knowledge of use of DME or AE;Decreased knowledge of precautions;Pain      OT Treatment/Interventions: Self-care/ADL training;Therapeutic exercise;Energy conservation;DME and/or AE instruction;Therapeutic activities;Patient/family education    OT Goals(Current goals can be found in the care plan section) Acute Rehab OT Goals Patient Stated Goal: get better OT Goal Formulation: With patient Time For Goal Achievement: 07/07/17 Potential to Achieve Goals: Good ADL Goals Pt Will Perform Lower Body Bathing: with adaptive equipment;sit to/from  stand;with max assist Pt Will Perform Lower Body Dressing: with adaptive equipment;sit to/from stand;with max assist Pt Will Transfer to Toilet: stand pivot transfer;bedside commode;with max assist  OT Frequency: Min 2X/week   Barriers to D/C:            Co-evaluation PT/OT/SLP Co-Evaluation/Treatment: Yes Reason for Co-Treatment: Necessary to address cognition/behavior during functional activity;For patient/therapist safety PT goals addressed during session: Mobility/safety with mobility OT goals addressed during session: ADL's and self-care      AM-PAC PT "6 Clicks" Daily Activity     Outcome Measure Help from another person eating meals?: None Help from another person taking care of personal grooming?: A Little Help from another person toileting, which includes using toliet, bedpan, or urinal?: A Lot Help from another person bathing (including washing, rinsing, drying)?: A Lot Help from another person to put on and taking off regular upper body clothing?: None Help from another person to put on and taking off  regular lower body clothing?: A Lot 6 Click Score: 17   End of Session Equipment Utilized During Treatment: Gait belt Nurse Communication: Mobility status  Activity Tolerance: Patient tolerated treatment well Patient left: in chair;with call bell/phone within reach;with family/visitor present  OT Visit Diagnosis: Unsteadiness on feet (R26.81);Other abnormalities of gait and mobility (R26.89);Muscle weakness (generalized) (M62.81);Pain;History of falling (Z91.81) Pain - Right/Left: Left Pain - part of body: Knee                Time: 3557-3220 OT Time Calculation (min): 25 min Charges:  OT General Charges $OT Visit: 1 Procedure OT Evaluation $OT Eval Moderate Complexity: 1 Procedure G-Codes:     Daphane Odekirk MSOT, OTR/L Acute Rehab Pager: [phone] Office: [phone]  Theodoro Grist Daulton Harbaugh 06/23/2017, 3:57 PM

## 2017-06-24 LAB — BASIC METABOLIC PANEL
Anion gap: 5 (ref 5–15)
BUN: 10 mg/dL (ref 6–20)
CO2: 26 mmol/L (ref 22–32)
Calcium: 8 mg/dL — ABNORMAL LOW (ref 8.9–10.3)
Chloride: 107 mmol/L (ref 101–111)
Creatinine, Ser: 0.59 mg/dL (ref 0.44–1.00)
GFR calc Af Amer: >60 mL/min (ref >60)
GFR calc non Af Amer: >60 mL/min (ref >60)
Glucose, Bld: 99 mg/dL (ref 65–99)
Potassium: 4.4 mmol/L (ref 3.5–5.1)
Sodium: 138 mmol/L (ref 135–145)

## 2017-06-24 LAB — CBC
HCT: 28.8 % — ABNORMAL LOW (ref 36.0–46.0)
Hemoglobin: 9.4 g/dL — ABNORMAL LOW (ref 12.0–15.0)
MCH: 37.5 pg — ABNORMAL HIGH (ref 26.0–34.0)
MCHC: 32.6 g/dL (ref 30.0–36.0)
MCV: 114.7 fL — ABNORMAL HIGH (ref 78.0–100.0)
Platelets: 255 10*3/uL (ref 150–400)
RBC: 2.51 MIL/uL — ABNORMAL LOW (ref 3.87–5.11)
RDW: 14.7 % (ref 11.5–15.5)
WBC: 9 10*3/uL (ref 4.0–10.5)

## 2017-06-24 NOTE — Social Work (Signed)
CSW received call back from [place] at [facility] advising that they could not offer a bed.  CSW spoke with mom and advised of same. Mom will decide between [facility] and [facility] for SNF placement. She will give it some thought and call CSW back.  CSW will continue to follow.

## 2017-06-24 NOTE — Progress Notes (Signed)
Orthopedic Trauma Service Progress Note    Subjective:  No new issues Getting ready to get up with therapy   ROS As above  Objective:   VITALS:   Vitals:   06/23/17 2103 06/24/17 0428 06/24/17 0819 06/24/17 1549  BP: 105/62 (!) 109/59  103/60  Pulse: 88 81 80 82  Resp: 16 16 16 18   Temp: 97.3 F (36.3 C) 97.5 F (36.4 C)  97 F (36.1 C)  TempSrc: Oral Oral  Oral  SpO2: 99% 98% 97% 99%  Weight:        Intake/Output      06/27 0701 - 06/28 0700 06/28 0701 - 06/29 0700   P.O. 956 240   I.V. (mL/kg) 1776.7 (19.4)    IV Piggyback 300    Total Intake(mL/kg) 3032.7 (33.2) 240 (2.6)   Urine (mL/kg/hr)     Blood     Total Output       Net +3032.7 +240        Urine Occurrence 3 x 1 x     LABS  Results for orders placed or performed during the hospital encounter of 06/20/17 (from the past 24 hour(s))  Basic metabolic panel     Status: Abnormal   Collection Time: 06/24/17  6:23 AM  Result Value Ref Range   Sodium 138 135 - 145 mmol/L   Potassium 4.4 3.5 - 5.1 mmol/L   Chloride 107 101 - 111 mmol/L   CO2 26 22 - 32 mmol/L   Glucose, Bld 99 65 - 99 mg/dL   BUN 10 6 - 20 mg/dL   Creatinine, Ser 06/26/17 0.44 - 1.00 mg/dL   Calcium 8.0 (L) 8.9 - 10.3 mg/dL   GFR calc non Af Amer >60 >60 mL/min   GFR calc Af Amer >60 >60 mL/min   Anion gap 5 5 - 15  CBC     Status: Abnormal   Collection Time: 06/24/17  6:23 AM  Result Value Ref Range   WBC 9.0 4.0 - 10.5 K/uL   RBC 2.51 (L) 3.87 - 5.11 MIL/uL   Hemoglobin 9.4 (L) 12.0 - 15.0 g/dL   HCT 06/26/17 (L) 18.5 - 63.1 %   MCV 114.7 (H) 78.0 - 100.0 fL   MCH 37.5 (H) 26.0 - 34.0 pg   MCHC 32.6 30.0 - 36.0 g/dL   RDW 49.7 02.6 - 37.8 %   Platelets 255 150 - 400 K/uL   Results for Marissa Gray, Marissa Gray (MRN Melonie [place]) as of 06/24/2017 16:16  Ref. Range 06/22/2017 06:07  Vit D, 1,25-Dihydroxy Latest Ref Range: 19.9 - 79.3 pg/mL 59.2  Vitamin D, 25-Hydroxy Latest Ref Range: 30.0 - 100.0 ng/mL 32.3  Results for  Marissa Gray, Marissa Gray (MRN Melonie [place]) as of 06/24/2017 16:16  Ref. Range 06/22/2017 06:07  TSH Latest Ref Range: 0.350 - 4.500 uIU/mL 8.153 (H)  T4,Free(Direct) Latest Ref Range: 0.61 - 1.12 ng/dL 06/24/2017    PHYSICAL EXAM:   Gen: awake and alert, resting comfortably, NAD, appears well Ext:       Right Lower Extremity              Dressing and immobilizer c/d/i   Knee immobilizer removed by myself              Ext warm             + DP pulse             DPN, SPN, TN sensation intact  EHL, FHL, lesser toe motor intact             No DCT                        Compartments are soft   Assessment/Plan: 2 Days Post-Op   Principal Problem:   Closed bicondylar fracture of right tibial plateau Active Problems:   Down's syndrome   Rheumatoid arthritis (HCC)   Atlantoaxial instability   Anti-infectives    Start     Dose/Rate Route Frequency Ordered Stop   06/22/17 1845  clindamycin (CLEOCIN) IVPB 600 mg     600 mg 100 mL/hr over 30 Minutes Intravenous Every 6 hours 06/22/17 1619 06/23/17 1850   06/22/17 1030  clindamycin (CLEOCIN) IVPB 900 mg     900 mg 100 mL/hr over 30 Minutes Intravenous  Once 06/21/17 1137 06/22/17 1230    .  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              NWB x 8 weeks             Unrestricted ROM R knee   Dc knee immobilizer    Do not think a hinged knee brace will fit well              Ice and elevate             PT/OT             Dressing change tomorrow              SNF tomorrow                 - chronic L knee pain              s/p steroid injection in OR              60 mg depo medrol, 2 cc lidocaine 1% plain and 2 cc 0.5% marcaine plain             WBAT             ROM as tolerated              Ice prn    - Pain management:              continue with current regimen               - ABL anemia/Hemodynamics             stable    - Medical issues              Home meds continued    - DVT/PE prophylaxis:             lovenox and scds             lovenox x 3 weeks post op      - ID:              periop abx completed    - Metabolic Bone Disease:             W/u ongoing   Vitamin d is ok                TSH is markedly  elevated                         May be related to acute injury and hospitalization but could also be suggestive of active thyroid dysfunction                          T4 is normal    T3 pending                          Will need follow up with PCP    - Activity:             NWB R leg   Unrestricted ROM R knee    - FEN/GI prophylaxis/Foley/Lines:           Reg diet    - Dispo:             Therapy              Hopeful for snf tomorrow       Mearl Latin, PA-C [facility] [phone] (O) 06/24/2017, 4:12 PM

## 2017-06-24 NOTE — Social Work (Addendum)
CSW met with mom today. CSW advised that clinicals has been faxed to [facility] on 6/27. CSW discussed that insurance that patient has may be an issues as she has medicaid. Mom aware of same. CSW discussed other placement and mom indicated [facility] or [facility]. CSW advsied the [facility] declined patient and prob due to insurance. CSW will f/u. Pt does not want to go to [facility]. CSW advised mom that [facility] offered bed and when DC we will need to have placement. Mom and patient in agreement.  CSW called Levada Dy at [facility] who advised that she is waiting to hear back on whether they can offer bed.   CSW called Juliann Pulse at [facility] and she confirmed that the denial was due to insurance.  CSW will continue to follow.

## 2017-06-24 NOTE — Progress Notes (Signed)
Physical Therapy Treatment Patient Details Name: Marissa Gray MRN: [id number] DOB: [birth year] Today's Date: 06/24/2017    History of Present Illness Pt is 36 yo female with Downs Syndrome who fell at her group home and was found to have R tibial plateau fx. Underwent ORIF as well as anterior fasciotomy and left knee steroid injection. Pt has h/o OA and foot triple arthrodesis. Has a mental age of around a 4th grader per her mother.     PT Comments    Pt performed transfer from bed to chair with +2 max assistance.  Pt continues to present with high anxiety when attempting to mobilize.  Pt pleasant throughout tx and began to gain therapists trust as treatment progressed. PA in room before transfer and removed her knee immobilizer.  Pt able to sit edge of bed x 10 min with knee flexed.  Will continue PT per POC with an emphasis on functional mobility.  Plan for short term SNF remains appropriate.   Follow Up Recommendations  SNF;Supervision/Assistance - 24 hour     Equipment Recommendations  Rolling walker with 5" wheels;Wheelchair (measurements PT);Other (comment) (WC with elevating R leg rest.  )    Recommendations for Other Services       Precautions / Restrictions Precautions Precautions: Fall Required Braces or Orthoses: Knee Immobilizer - Right Knee Immobilizer - Right: Other (comment) (during tx PA entered room and report she does not need the knee immobilizer anymore.  )    Mobility  Bed Mobility Overal bed mobility: Needs Assistance Bed Mobility: Supine to Sit     Supine to sit: Mod assist     General bed mobility comments: Cues for hand placement to reach for rail and push to scoot forward.  Pt required cues for max VCs for encouragement and assistance to move RLE forward.    Transfers Overall transfer level: Needs assistance Equipment used:  (Attempted youth height RW and patient became nervous so RW removed and performed stand pivot with +2 hand held assistance.   ) Transfers: Sit to/from [employer] Sit to Stand: Max assist;+2 physical assistance Stand pivot transfers: Max assist;+2 physical assistance       General transfer comment: pt very anxious EOB, would not attempt standing to RW and in fact was resisting and leaning back when fwd wt shift initiated. Did better with 2 person assist and stood erect and able to hop from bed to the chair.  Pt elated once she made it to the chair and apologized for being so nervous.    Ambulation/Gait Ambulation/Gait assistance:  (unable due to anxiety.  )           General Gait Details: unable at this point   Stairs            Wheelchair Mobility    Modified Rankin (Stroke Patients Only)       Balance Overall balance assessment: Needs assistance   Sitting balance-Leahy Scale: Fair       Standing balance-Leahy Scale: Poor                              Cognition Arousal/Alertness: Awake/alert Behavior During Therapy: Anxious Overall Cognitive Status: History of cognitive impairments - at baseline                                 General Comments: Pt with down syndrome  diagnosis. Very sweet and remains anxious to move.        Exercises      General Comments        Pertinent Vitals/Pain Pain Assessment: Faces Faces Pain Scale: Hurts even more Pain Location: R knee Pain Descriptors / Indicators: Aching;Operative site guarding;Sore Pain Intervention(s): Monitored during session;Repositioned    Home Living                      Prior Function            PT Goals (current goals can now be found in the care plan section) Acute Rehab PT Goals Patient Stated Goal: get better Potential to Achieve Goals: Good Progress towards PT goals: Progressing toward goals    Frequency    Min 3X/week      PT Plan Current plan remains appropriate    Co-evaluation              AM-PAC PT "6 Clicks" Daily Activity  Outcome  Measure  Difficulty turning over in bed (including adjusting bedclothes, sheets and blankets)?: Total Difficulty moving from lying on back to sitting on the side of the bed? : Total Difficulty sitting down on and standing up from a chair with arms (e.g., wheelchair, bedside commode, etc,.)?: Total Help needed moving to and from a bed to chair (including a wheelchair)?: A Lot Help needed walking in hospital room?: Total Help needed climbing 3-5 steps with a railing? : Total 6 Click Score: 7    End of Session Equipment Utilized During Treatment: Gait belt Activity Tolerance: Patient tolerated treatment well Patient left: with call bell/phone within reach;with family/visitor present;in bed Nurse Communication: Mobility status PT Visit Diagnosis: Unsteadiness on feet (R26.81);Muscle weakness (generalized) (M62.81);History of falling (Z91.81);Difficulty in walking, not elsewhere classified (R26.2);Pain Pain - Right/Left: Right Pain - part of body: Knee     Time: 9629-5284 PT Time Calculation (min) (ACUTE ONLY): 23 min  Charges:  $Therapeutic Activity: 23-37 mins                    G Codes:       Joycelyn Rua, PTA pager [phone]    Florestine Avers 06/24/2017, 4:41 PM

## 2017-06-25 ENCOUNTER — Encounter (HOSPITAL_COMMUNITY): Payer: Self-pay | Admitting: Orthopedic Surgery

## 2017-06-25 DIAGNOSIS — S82141K Displaced bicondylar fracture of right tibia, subsequent encounter for closed fracture with nonunion: Secondary | ICD-10-CM | POA: Diagnosis present

## 2017-06-25 DIAGNOSIS — R7989 Other specified abnormal findings of blood chemistry: Secondary | ICD-10-CM | POA: Diagnosis present

## 2017-06-25 DIAGNOSIS — K219 Gastro-esophageal reflux disease without esophagitis: Secondary | ICD-10-CM | POA: Diagnosis present

## 2017-06-25 HISTORY — DX: Displaced bicondylar fracture of right tibia, subsequent encounter for closed fracture with nonunion: S82.141K

## 2017-06-25 LAB — PTH, INTACT AND CALCIUM
Calcium, Total (PTH): 7.5 mg/dL — ABNORMAL LOW (ref 8.7–10.2)
PTH: 36 pg/mL (ref 15–65)

## 2017-06-25 LAB — VITAMIN D 25 HYDROXY (VIT D DEFICIENCY, FRACTURES): Vit D, 25-Hydroxy: 32.3 ng/mL (ref 30.0–100.0)

## 2017-06-25 LAB — T3, FREE: T3, Free: 2.2 pg/mL (ref 2.0–4.4)

## 2017-06-25 LAB — CALCITRIOL (1,25 DI-OH VIT D): Vit D, 1,25-Dihydroxy: 59.2 pg/mL (ref 19.9–79.3)

## 2017-06-25 LAB — HEMOGLOBIN A1C
Hgb A1c MFr Bld: 5 % (ref 4.8–5.6)
Mean Plasma Glucose: 97 mg/dL

## 2017-06-25 LAB — CALCIUM, IONIZED: Calcium, Ionized, Serum: 4.6 mg/dL (ref 4.5–5.6)

## 2017-06-25 MED ORDER — METHOCARBAMOL 500 MG PO TABS: 500.0000 mg | ORAL_TABLET | Freq: Four times a day (QID) | ORAL | 0 refills | Status: DC | PRN

## 2017-06-25 MED ORDER — ENOXAPARIN SODIUM 40 MG/0.4ML ~~LOC~~ SOLN: 40.0000 mg | SUBCUTANEOUS | 0 refills | Status: DC

## 2017-06-25 MED ORDER — OXYCODONE HCL 5 MG PO TABS: 5.0000 mg | ORAL_TABLET | Freq: Four times a day (QID) | ORAL | 0 refills | Status: DC | PRN

## 2017-06-25 MED ORDER — ACETAMINOPHEN 500 MG PO TABS: 500.0000 mg | ORAL_TABLET | Freq: Four times a day (QID) | ORAL | 0 refills | Status: DC | PRN

## 2017-06-25 MED ORDER — DOCUSATE SODIUM 100 MG PO CAPS: 100.0000 mg | ORAL_CAPSULE | Freq: Two times a day (BID) | ORAL | 0 refills | Status: DC

## 2017-06-25 NOTE — NC FL2 (Signed)
[place] MEDICAID FL2 LEVEL OF CARE SCREENING TOOL     IDENTIFICATION  Patient Name: Marissa Gray Birthdate: Mar 31, 1981 Sex: female Admission Date (Current Location): 06/20/2017  [facility] and [place] Number:  Producer, television/film/video and Address:  [facility], [address], [place], [place] [address]      Provider Number: [id number]  Attending Physician Name and Address:  Myrene Galas, MD  Relative Name and Phone Number:       Current Level of Care: Hospital Recommended Level of Care: Skilled Nursing Facility Prior Approval Number:    Date Approved/Denied: 06/25/17 PASRR Number: [id number]  Discharge Plan: SNF    Current Diagnoses: Patient Active Problem List   Diagnosis Date Noted  . Elevated TSH 06/25/2017  . 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 GERD (gastroesophageal reflux disease)   . 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 Down's syndrome   . Rheumatoid arthritis (HCC)   . Closed bicondylar fracture of right tibial plateau 06/20/2017    Orientation RESPIRATION BLADDER Height & Weight     Self, Time, Situation, Place  Normal Continent Weight: 201 lb 8 oz (91.4 kg) Height:     BEHAVIORAL SYMPTOMS/MOOD NEUROLOGICAL BOWEL NUTRITION STATUS      Continent Diet (See DC summary)  AMBULATORY STATUS COMMUNICATION OF NEEDS Skin   Extensive Assist Verbally Surgical wounds (Closed Right Leg Incision Compression WRap)                       Personal Care Assistance Level of Assistance  Bathing, Feeding, Dressing Bathing Assistance: Maximum assistance Feeding assistance: Independent Dressing Assistance: Maximum assistance     Functional Limitations Info  Sight, Hearing, Speech Sight Info: Adequate Hearing Info: Adequate Speech Info: Adequate    SPECIAL CARE FACTORS FREQUENCY  PT (By licensed PT), OT (By licensed OT)     PT Frequency: 5xweek OT Frequency: 5xweek            Contractures       Additional Factors Info  Code Status, Allergies Code Status Info: Full Allergies Info: CECLOR CEFACLOR, PENICILLINS, SULFA ANTIBIOTICS            Current Medications (06/25/2017):  This is the current hospital active medication list Current Facility-Administered Medications  Medication Dose Route Frequency Provider Last Rate Last Dose  . acetaminophen (TYLENOL) tablet 1,000 mg  1,000 mg Oral Q6H Montez Morita, PA-C   1,000 mg at 06/25/17 0104  . albuterol (PROVENTIL) (2.5 MG/3ML) 0.083% nebulizer solution 3 mL  3 mL Inhalation Q4H PRN Albina Billet III, PA-C      . budesonide (PULMICORT) nebulizer solution 0.25 mg  0.25 mg Nebulization BID Hammons, Kimberly B, RPH   0.25 mg at 06/24/17 2125  . docusate sodium (COLACE) capsule 100 mg  100 mg Oral BID Albina Billet III, PA-C   100 mg at 06/25/17 1583  . enoxaparin (LOVENOX) injection 40 mg  40 mg Subcutaneous Q24H Montez Morita, PA-C   40 mg at 06/25/17 0810  . folic acid (FOLVITE) tablet 2 mg  2 mg Oral Daily Albina Billet III, PA-C   2 mg at 06/25/17 0940  . HYDROmorphone (DILAUDID) injection 0.25-0.5 mg  0.25-0.5 mg Intravenous Q3H PRN Montez Morita, PA-C      . ketotifen (ZADITOR) 0.025 % ophthalmic solution 1 drop  1 drop Both Eyes BID Albina Billet III, PA-C   1 drop at 06/25/17 0810  . levonorgestrel-ethinyl  estradiol (AVIANE,ALESSE,LESSINA) 0.1-20 MG-MCG per tablet 1 tablet  1 tablet Oral Daily Martensen, Lucretia Kern III, PA-C      . methocarbamol (ROBAXIN) tablet 500 mg  500 mg Oral Q6H PRN Albina Billet III, PA-C   500 mg at 06/25/17 0542   Or  . methocarbamol (ROBAXIN) 500 mg in dextrose 5 % 50 mL IVPB  500 mg Intravenous Q6H PRN Albina Billet III, PA-C      . metoCLOPramide (REGLAN) tablet 5-10 mg  5-10 mg Oral Q8H PRN Montez Morita, PA-C       Or  . metoCLOPramide (REGLAN) injection 5-10 mg  5-10 mg Intravenous Q8H PRN Montez Morita, PA-C      . montelukast (SINGULAIR)  tablet 10 mg  10 mg Oral QPM Albina Billet III, PA-C   10 mg at 06/24/17 1627  . mupirocin ointment (BACTROBAN) 2 % 1 application  1 application Topical Daily Albina Billet III, PA-C   1 application at 06/25/17 0809  . ondansetron (ZOFRAN) tablet 4 mg  4 mg Oral Q6H PRN Montez Morita, PA-C       Or  . ondansetron [facility] injection 4 mg  4 mg Intravenous Q6H PRN Montez Morita, PA-C      . oxyCODONE (Oxy IR/ROXICODONE) immediate release tablet 5-10 mg  5-10 mg Oral Q6H PRN Albina Billet III, PA-C   10 mg at 06/25/17 0104  . pantoprazole (PROTONIX) EC tablet 40 mg  40 mg Oral Daily Albina Billet III, PA-C   40 mg at 06/25/17 0240  . polyethylene glycol (MIRALAX / GLYCOLAX) packet 17 g  17 g Oral Daily PRN Albina Billet III, PA-C      . potassium chloride (K-DUR,KLOR-CON) CR tablet 10 mEq  10 mEq Oral BID Albina Billet III, PA-C   10 mEq at 06/25/17 0809  . psyllium (HYDROCIL/METAMUCIL) packet 1 packet  1 packet Oral Daily Albina Billet III, PA-C   1 packet at 06/25/17 [phone]  . senna (SENOKOT) tablet 8.6 mg  1 tablet Oral BID Albina Billet III, PA-C   8.6 mg at 06/25/17 3299  . sorbitol 70 % solution 30 mL  30 mL Oral Daily PRN Albina Billet III, PA-C         Discharge Medications: Please see discharge summary for a list of discharge medications.  Relevant Imaging Results:  Relevant Lab Results:   Additional Information SS: [id number]  Tresa Moore, LCSW

## 2017-06-25 NOTE — Discharge Summary (Signed)
Orthopaedic Trauma Service (OTS)  Patient ID: Marissa Gray MRN: [id number] DOB/AGE: 04-24-1981 36 y.o.  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 Marcelino Scot 1. Open reduction and internal fixation of bicondylar tibial plateau     fracture with extensive bone grafting using multiple Infuse sponges     and cancellous allograft. 2. Anterior compartment fasciotomy. 3. Left knee steroid injection.   Discharged Condition: good  Hospital Course:   Patient is a very pleasant 36 year old white female with history of Down syndrome who is admitted on 06/20/2017. Patient reportedly fell on the day before presentation to the hospital while getting out of a van. Patient states that she fell on her right knee. She had some pain and inability to ambulate. She ultimately presented to the hospital on 06/20/2017. She was found to have a right bicondylar tibial plateau fracture. She was admitted to the orthopedic service as it was an isolated injury. She was seen and evaluated by the orthopedic trauma service. We did obtain a CT scan of her knee which was highly suspicious for subacute fracture and possibly even a chronic nonunion with acute fracture. Patient was transferred to [place] and was then taken to the OR on 06/22/2017 with the procedures noted above were performed. We also did talk to the patient's rheumatologist before going to the OR as she was due to get a left knee injection the week of her  hospitalization. Rheumatology was agreeable to allow [place] to provide the patient with a intra-articular corticosteroid injection to the left knee.  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 We did encounter significant fluid that is frequently seen with chronic nonunions. Patient tolerated the procedure well. After surgery she was transferred back to the orthopedic floor for continued observation and pain control. She was covered with Lovenox for DVT and PE prophylaxis. She was covered antibiotics for surgical prophylaxis. We did initiate a metabolic bone workup which was notable for severely elevated TSH. T3 and T4 levels were normal. Her PTH levels were normal and her 25-hydroxy vitamin D was around 32 which is acceptable.  Patient began working with therapies on postoperative day #37 and she did very well. No perioperative issues were noted. Ultimately on postoperative day #3 patient was deemed to be stable for discharge to skilled nursing facility. Once patient and her group home feel comfortable with the caring for her back in a group home setting is completely fine for her to transition from the skilled center to her group home.  Patient will remain nonweightbearing for 8 weeks. We are process of trying to get patient a bone growth stimulator as it appears that this is a chronic nonunion with an acute component. We are hopeful that this will be approved which would be a useful adjuvant for her healing process.  Patient discharged in stable condition on postoperative day #3. Patient tolerating regular diet and voiding without difficulty  Consults: None  Significant Diagnostic Studies: labs:  Results for ESMAY, AMSPACHER (MRN [id number]) as of 06/25/2017 08:38  Ref. Range 06/24/2017 06:23  Sodium Latest Ref Range: 135 - 145 mmol/L 138  Potassium Latest Ref Range: 3.5 - 5.1 mmol/L 4.4  Chloride Latest  Ref Range: 101 - 111 mmol/L 107  CO2 Latest Ref Range: 22 - 32 mmol/L 26  Glucose Latest Ref Range: 65 - 99 mg/dL 99  BUN Latest Ref Range: 6 - 20 mg/dL 10  Creatinine Latest Ref Range: 0.44 - 1.00 mg/dL 0.59  Calcium Latest Ref Range: 8.9 - 10.3 mg/dL 8.0 (L)  Anion gap Latest Ref Range: 5 - 15  5  EGFR (African American) Latest Ref Range: >60 mL/min >60  EGFR (Non-African Amer.) Latest Ref Range: >60 mL/min >60  WBC Latest Ref Range: 4.0 - 10.5 K/uL 9.0  RBC Latest Ref Range: 3.87 - 5.11 MIL/uL 2.51 (L)  Hemoglobin Latest Ref Range: 12.0 - 15.0 g/dL 9.4 (L)  HCT Latest Ref Range: 36.0 - 46.0 % 28.8 (L)  MCV Latest Ref Range: 78.0 - 100.0 fL 114.7 (H)  MCH Latest Ref Range: 26.0 - 34.0 pg 37.5 (H)  MCHC Latest Ref Range: 30.0 - 36.0 g/dL 32.6  RDW Latest Ref Range: 11.5 - 15.5 % 14.7  Platelets Latest Ref Range: 150 - 400 K/uL 255   Results for CHRISIE, JANKOVICH (MRN [id number]) as of 06/25/2017 08:38  Ref. Range 06/22/2017 06:07  Vit D, 1,25-Dihydroxy Latest Ref Range: 19.9 - 79.3 pg/mL 59.2  Vitamin D, 25-Hydroxy Latest Ref Range: 30.0 - 100.0 ng/mL 32.3   Results for AZIAH, KAISER (MRN [id number]) as of 06/25/2017 08:38  Ref. Range 06/22/2017 06:07 06/22/2017 15:15 06/22/2017 15:51 06/23/2017 04:59 06/24/2017 06:23  PTH, Intact Latest Ref Range: 15 - 65 pg/mL 36      PTH Interp Unknown Comment      TSH Latest Ref Range: 0.350 - 4.500 uIU/mL 8.153 (H)      Triiodothyronine,Free,Serum Latest Ref Range: 2.0 - 4.4 pg/mL     2.2  T4,Free(Direct) Latest Ref Range: 0.61 - 1.12 ng/dL 0.88       Treatments: IV hydration, antibiotics: clindamycin, analgesia: tylenol, Oxy IR, anticoagulation: LMW heparin, therapies: PT, OT, RN and SW and surgery: as above     Discharge Exam:  Orthopedic Trauma Service Progress Note      Subjective:   Doing well, no new issues Ready to go to SNF   ROS As above    Objective:    VITALS:         Vitals:    06/24/17 1549 06/24/17 2109 06/24/17 2125  06/25/17 0504  BP: 103/60 113/63   134/71  Pulse: 82 87 88 72  Resp: _0 Temp: 97 F (36.1 C) 97.7 F (36.5 C)   97 F (36.1 C)  TempSrc: Oral Oral   Oral  SpO2: 99% 99% 96% 100%  Weight:              Intake/Output      06/28 0701 - 06/29 0700 06/29 0701 - 06/30 0700   P.O. 720    I.V. (mL/kg)     IV Piggyback 0    Total Intake(mL/kg) 720 (7.9)    Urine (mL/kg/hr) 300 (0.1)    Total Output 300     Net +420          Urine Occurrence 2 x       LABS   Results for MISHAL, PROBERT (MRN [id number]) as of 06/25/2017  08:38   Ref. Range 06/22/2017 06:07  Vit D, 1,25-Dihydroxy Latest Ref Range: 19.9 - 79.3 pg/mL 59.2  Vitamin D, 25-Hydroxy Latest Ref Range: 30.0 - 100.0 ng/mL 32.3    Results for KIMARIE, COOR (MRN [id number]) as of 06/25/2017 08:38   Ref. Range 06/22/2017 06:07 06/22/2017 15:15 06/22/2017 15:51 06/23/2017 04:59 06/24/2017 06:23  TSH Latest Ref Range: 0.350 - 4.500 uIU/mL 8.153 (H)          Triiodothyronine,Free,Serum Latest Ref Range: 2.0 - 4.4 pg/mL         2.2  T4,Free(Direct) Latest Ref Range: 0.61 - 1.12 ng/dL 0.88            Results for DELAINA, FETSCH (MRN [id number]) as of 06/25/2017 08:38   Ref. Range 06/22/2017 06:07  PTH, Intact Latest Ref Range: 15 - 65 pg/mL 36      PHYSICAL EXAM:    Gen: awake and alert, resting comfortably, NAD, appears well, eating breakfast  Ext:       Right Lower Extremity              dressing c/d/i                Dressings removed             Incisions look fantastic              No signs of infection              No drainage              Ext warm             + DP pulse             DPN, SPN, TN sensation intact              EHL, FHL, lesser toe motor intact             No DCT                        Compartments are soft    Assessment/Plan: 3 Days Post-Op    Principal Problem:   Closed bicondylar fracture of right tibial plateau Active Problems:   Down's syndrome   Rheumatoid arthritis (HCC)   Atlantoaxial  instability   Elevated TSH               Anti-infectives     Start     Dose/Rate Route Frequency Ordered Stop    06/22/17 1845   clindamycin (CLEOCIN) IVPB 600 mg     600 mg 100 mL/hr over 30 Minutes Intravenous Every 6 hours 06/22/17 1619 06/23/17 1850    06/22/17 1030   clindamycin (CLEOCIN) IVPB 900 mg     900 mg 100 mL/hr over 30 Minutes Intravenous  Once 06/21/17 1137 06/22/17 1230     .   POD/HD#: 10   36 y/o female with Down syndrome with acute on chronic R bicondylar tibial plateau fracture    -R bicondylar tibial plateau fracture acute on chronic bicondylar tibial plateau fracture, chronic nonunion R tibial plateau s/p ORIF              NWB x 8 weeks             Unrestricted ROM R knee                         Dc knee  immobilizer                          Do not think a hinged knee brace will fit well                          Encourage range of motion as much as possible                         Do not put pillows under knee when in bed or chair                         Place pillows under ankle at rest to keep knee in extension or use zero knee bone foam              Ice and elevate             PT/OT             daily dressing changes                         Ok to leave wounds open to air once there is no drainage                         Ok to clean wounds with soap and water only              SNF today    - chronic L knee pain              s/p steroid injection in OR              60 mg depo medrol, 2 cc lidocaine 1% plain and 2 cc 0.5% marcaine plain             WBAT             ROM as tolerated              Ice prn    - Pain management:             continue with current regimen      - ABL anemia/Hemodynamics             stable    - Medical issues              Home meds continued    - DVT/PE prophylaxis:             lovenox and scds             lovenox x 3 weeks post op      - ID:              periop abx completed    - Metabolic Bone Disease:              PTH normal              Vitamin d is ok                TSH is markedly elevated                         May be related to acute injury and hospitalization but could also be suggestive of active thyroid dysfunction  T4 is normal                          T3 is normal                          Will need follow up with PCP    - Activity:             NWB R leg              Unrestricted ROM R knee    - FEN/GI prophylaxis/Foley/Lines:           Reg diet    - Dispo:             dc to snf today             Follow up with ortho in 14 days      Disposition: SNF   Discharge Instructions    Call MD / Call 911    Complete by:  As directed    If you experience chest pain or shortness of breath, CALL 911 and be transported to the hospital emergency room.  If you develope a fever above 101 F, pus (white drainage) or increased drainage or redness at the wound, or calf pain, call your surgeon's office.   Constipation Prevention    Complete by:  As directed    Drink plenty of fluids.  Prune juice may be helpful.  You may use a stool softener, such as Colace (over the counter) 100 mg twice a day.  Use MiraLax (over the counter) for constipation as needed.   Diet general    Complete by:  As directed    Discharge instructions    Complete by:  As directed    Orthopaedic Trauma Service Discharge Instructions   General Discharge Instructions  WEIGHT BEARING STATUS: Nonweightbearing Right Lower extremity   RANGE OF MOTION/ACTIVITY: unrestricted range of motion of Right knee and ankle   No pillows under bend of knee when at rest. Elevate leg by placing pillows under ankle or using zero knee bone foam   Wound Care: daily wound care starting on 06/26/2017. See instructions below  Discharge Wound Care Instructions  Do NOT apply any ointments, solutions or lotions to pin sites or surgical wounds.  These prevent needed drainage and even though solutions like hydrogen  peroxide kill bacteria, they also damage cells lining the pin sites that help fight infection.  Applying lotions or ointments can keep the wounds moist and can cause them to breakdown and open up as well. This can increase the risk for infection. When in doubt call the office.  Surgical incisions should be dressed daily.  If any drainage is noted, use one layer of adaptic, then gauze, Kerlix, and an ace wrap. You can also use 4x4s and tape   Once the incision is completely dry and without drainage, it may be left open to air out.  Showering may begin 36-48 hours later.  Cleaning gently with soap and water.   PAIN MEDICATION USE AND EXPECTATIONS  You have likely been given narcotic medications to help control your pain.  After a traumatic event that results in an fracture (broken bone) with or without surgery, it is ok to use narcotic pain medications to help control one's pain.  We understand that everyone responds to pain differently and each individual patient will be evaluated on a regular basis for the  continued need for narcotic medications. Ideally, narcotic medication use should last no more than 6-8 weeks (coinciding with fracture healing).   As a patient it is your responsibility as well to monitor narcotic medication use and report the amount and frequency you use these medications when you come to your office visit.   We would also advise that if you are using narcotic medications, you should take a dose prior to therapy to maximize you participation.  IF YOU ARE ON NARCOTIC MEDICATIONS IT IS NOT PERMISSIBLE TO OPERATE A MOTOR VEHICLE (MOTORCYCLE/CAR/TRUCK/MOPED) OR HEAVY MACHINERY DO NOT MIX NARCOTICS WITH OTHER CNS (CENTRAL NERVOUS SYSTEM) DEPRESSANTS SUCH AS ALCOHOL  Diet: as you were eating previously.  Can use over the counter stool softeners and bowel preparations, such as Miralax, to help with bowel movements.  Narcotics can be constipating.  Be sure to drink plenty of  fluids    STOP SMOKING OR USING NICOTINE PRODUCTS!!!!  As discussed nicotine severely impairs your body's ability to heal surgical and traumatic wounds but also impairs bone healing.  Wounds and bone heal by forming microscopic blood vessels (angiogenesis) and nicotine is a vasoconstrictor (essentially, shrinks blood vessels).  Therefore, if vasoconstriction occurs to these microscopic blood vessels they essentially disappear and are unable to deliver necessary nutrients to the healing tissue.  This is one modifiable factor that you can do to dramatically increase your chances of healing your injury.    (This means no smoking, no nicotine gum, patches, etc)  DO NOT USE NONSTEROIDAL ANTI-INFLAMMATORY DRUGS (NSAID'S)  Using products such as Advil (ibuprofen), Aleve (naproxen), Motrin (ibuprofen) for additional pain control during fracture healing can delay and/or prevent the healing response.  If you would like to take over the counter (OTC) medication, Tylenol (acetaminophen) is ok.  However, some narcotic medications that are given for pain control contain acetaminophen as well. Therefore, you should not exceed more than 4000 mg of tylenol in a day if you do not have liver disease.  Also note that there are may OTC medicines, such as cold medicines and allergy medicines that my contain tylenol as well.  If you have any questions about medications and/or interactions please ask your doctor/PA or your pharmacist.      ICE AND ELEVATE INJURED/OPERATIVE EXTREMITY  Using ice and elevating the injured extremity above your heart can help with swelling and pain control.  Icing in a pulsatile fashion, such as 20 minutes on and 20 minutes off, can be followed.    Do not place ice directly on skin. Make sure there is a barrier between to skin and the ice pack.    Using frozen items such as frozen peas works well as the conform nicely to the are that needs to be iced.  USE AN ACE WRAP OR TED HOSE FOR SWELLING  CONTROL  In addition to icing and elevation, Ace wraps or TED hose are used to help limit and resolve swelling.  It is recommended to use Ace wraps or TED hose until you are informed to stop.    When using Ace Wraps start the wrapping distally (farthest away from the body) and wrap proximally (closer to the body)   Example: If you had surgery on your leg or thing and you do not have a splint on, start the ace wrap at the toes and work your way up to the thigh        If you had surgery on your upper extremity and do not have a  splint on, start the ace wrap at your fingers and work your way up to the upper arm  IF YOU ARE IN A SPLINT OR CAST DO NOT REMOVE IT FOR ANY REASON   If your splint gets wet for any reason please contact the office immediately. You may shower in your splint or cast as long as you keep it dry.  This can be done by wrapping in a cast cover or garbage back (or similar)  Do Not stick any thing down your splint or cast such as pencils, money, or hangers to try and scratch yourself with.  If you feel itchy take benadryl as prescribed on the bottle for itching  IF YOU ARE IN A CAM BOOT (BLACK BOOT)  You may remove boot periodically. Perform daily dressing changes as noted below.  Wash the liner of the boot regularly and wear a sock when wearing the boot. It is recommended that you sleep in the boot until told otherwise  CALL THE OFFICE WITH ANY QUESTIONS OR CONCERNS: [phone]   Do not put a pillow under the knee. Place it under the heel.    Complete by:  As directed    Place pillow under ankle/heel or use zero knee bone foam   Increase activity slowly as tolerated    Complete by:  As directed    Non weight bearing    Complete by:  As directed    Laterality:  right   Extremity:  Lower     Allergies as of 06/25/2017      Reactions   Ceclor [cefaclor] Hives   Penicillins Hives   Has patient had a PCN reaction causing immediate rash, facial/tongue/throat swelling, SOB or  lightheadedness with hypotension: Unknown Has patient had a PCN reaction causing severe rash involving mucus membranes or skin necrosis: Unknown Has patient had a PCN reaction that required hospitalization: Unknown Has patient had a PCN reaction occurring within the last 10 years: Unknown If all of the above answers are "NO", then may proceed with Cephalosporin use.   Sulfa Antibiotics Hives, Other (See Comments)   FEVER      Medication List    STOP taking these medications   diclofenac sodium 1 % Gel Commonly known as:  VOLTAREN     TAKE these medications   acetaminophen 500 MG tablet Commonly known as:  TYLENOL Take 1-2 tablets (500-1,000 mg total) by mouth every 6 (six) hours as needed for mild pain or moderate pain. What changed:  when to take this   albuterol 108 (90 Base) MCG/ACT inhaler Commonly known as:  PROVENTIL HFA;VENTOLIN HFA Inhale 2 puffs into the lungs every 4 (four) hours as needed for wheezing or shortness of breath.   beclomethasone 40 MCG/ACT inhaler Commonly known as:  QVAR Inhale 1 puff into the lungs 2 (two) times daily.   docusate sodium 100 MG capsule Commonly known as:  COLACE Take 1 capsule (100 mg total) by mouth 2 (two) times daily.   enoxaparin 40 MG/0.4ML injection Commonly known as:  LOVENOX Inject 0.4 mLs (40 mg total) into the skin daily. Start taking on:  06/26/2017   esomeprazole 40 MG capsule Commonly known as:  NEXIUM Take 40 mg by mouth daily at 12 noon.   folic acid 1 MG tablet Commonly known as:  FOLVITE Take 2 mg by mouth daily.   ketotifen 0.025 % ophthalmic solution Commonly known as:  ZADITOR Apply 1 drop to eye 2 (two) times daily.   levocetirizine 5 MG  tablet Commonly known as:  XYZAL Take 5 mg by mouth every evening.   methocarbamol 500 MG tablet Commonly known as:  ROBAXIN Take 1-2 tablets (500-1,000 mg total) by mouth every 6 (six) hours as needed for muscle spasms.   methotrexate 2.5 MG tablet Take 7.5-10  mg by mouth once a week. On 7.5 mg on Thursday mornings and 10 mg on Thursday evenings   montelukast 10 MG tablet Commonly known as:  SINGULAIR Take 10 mg by mouth every evening.   multivitamin with minerals Tabs tablet Take 1 tablet by mouth daily.   mupirocin ointment 2 % Commonly known as:  BACTROBAN Apply 1 application topically daily.   nabumetone 500 MG tablet Commonly known as:  RELAFEN Take 500 mg by mouth 2 (two) times daily.   OCUSOFT BABY EYELID & EYELASH Pads Apply 1 application topically 2 (two) times daily.   oxyCODONE 5 MG immediate release tablet Commonly known as:  Oxy IR/ROXICODONE Take 1-2 tablets (5-10 mg total) by mouth every 6 (six) hours as needed for severe pain or breakthrough pain.   potassium chloride 10 MEQ CR capsule Commonly known as:  MICRO-K Take 10 mEq by mouth 2 (two) times daily.   psyllium 58.6 % packet Commonly known as:  METAMUCIL Take 1 packet by mouth daily.   sodium chloride 0.65 % Soln nasal spray Commonly known as:  OCEAN Place 1-2 sprays into both nostrils 2 (two) times daily.   triamcinolone ointment 0.1 % Commonly known as:  KENALOG Apply 1 application topically 2 (two) times daily as needed (rash).   VIENVA 0.1-20 MG-MCG tablet Generic drug:  levonorgestrel-ethinyl estradiol Take 1 tablet by mouth daily.   ZEASORB-AF 2 % powder Generic drug:  miconazole Apply 1 application topically 2 (two) times daily as needed for itching (under stomach).       Contact information for follow-up providers    Altamese Choccolocco, MD. Schedule an appointment as soon as possible for a visit in 2 week(s).   Specialty:  Orthopedic Surgery Contact information: [place] [address] [phone]        Carol Ada, MD. Schedule an appointment as soon as possible for a visit in 4 week(s).   Specialty:  Family Medicine Why:  follow up on TSH  Contact information: [address] [place] [address] [phone]            Contact information for after-discharge care    Destination    [facility] [place] [facility] Follow up.   Specialty:  [place] information: 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              NWB x 8 weeks             Unrestricted ROM R knee                         Dc knee immobilizer  Encourage range of motion as much as possible                         Do not put pillows under knee when in bed or chair                         Place pillows under ankle at rest to keep knee in extension or use zero knee bone foam              Ice and elevate             PT/OT             daily dressing changes   Dry dressings such as 4x4 gauze and tape                          Ok to leave wounds open to air once there is no drainage                         Ok to clean wounds with soap and water only              SNF today    - chronic L knee pain              s/p steroid injection in OR              60 mg depo medrol, 2 cc lidocaine 1% plain and 2 cc 0.5% marcaine plain             WBAT             ROM as tolerated              Ice prn    - Pain management:             continue with current regimen      - ABL anemia/Hemodynamics             stable    - Medical issues              Home meds continued    - DVT/PE prophylaxis:             lovenox and scds             lovenox x 3 weeks post op      - ID:              periop abx completed    - Metabolic Bone Disease:             PTH normal              Vitamin d is ok                TSH is markedly elevated                          May be related to acute injury and hospitalization but could also be suggestive of active thyroid dysfunction                          T4 is normal                          T3  is normal                          Will need follow up with PCP    - Activity:             NWB R leg              Unrestricted ROM R knee    - FEN/GI prophylaxis/Foley/Lines:           Reg diet    - Dispo:             dc to snf today             Follow up with ortho in 14 days   Follow up with PCP in 4 weeks      Signed:  Jari Pigg, PA-C [facility] [phone] (P) 06/25/2017, 8:58 AM

## 2017-06-25 NOTE — Social Work (Signed)
Passr obtained# [id number] and FL2 updated and sent to doctor to review.  Patient has accepted bed offer from [facility] and will DC to facility when ready.  CSW will f/u.

## 2017-06-25 NOTE — Progress Notes (Signed)
Orthopedic Trauma Service Progress Note    Subjective:  Doing well, no new issues Ready to go to SNF  ROS As above   Objective:   VITALS:   Vitals:   06/24/17 1549 06/24/17 2109 06/24/17 2125 06/25/17 0504  BP: 103/60 113/63  134/71  Pulse: 82 87 88 72  Resp: 18 18 18 18   Temp: 97 F (36.1 C) 97.7 F (36.5 C)  97 F (36.1 C)  TempSrc: Oral Oral  Oral  SpO2: 99% 99% 96% 100%  Weight:        Intake/Output      06/28 0701 - 06/29 0700 06/29 0701 - 06/30 0700   P.O. 720    I.V. (mL/kg)     IV Piggyback 0    Total Intake(mL/kg) 720 (7.9)    Urine (mL/kg/hr) 300 (0.1)    Total Output 300     Net +420          Urine Occurrence 2 x      LABS  Results for CHARIE, SUBIA (MRN [id number]) as of 06/25/2017 08:38  Ref. Range 06/22/2017 06:07  Vit D, 1,25-Dihydroxy Latest Ref Range: 19.9 - 79.3 pg/mL 59.2  Vitamin D, 25-Hydroxy Latest Ref Range: 30.0 - 100.0 ng/mL 32.3   Results for DANALYNN, BERHOW (MRN [id number]) as of 06/25/2017 08:38  Ref. Range 06/22/2017 06:07 06/22/2017 15:15 06/22/2017 15:51 06/23/2017 04:59 06/24/2017 06:23  TSH Latest Ref Range: 0.350 - 4.500 uIU/mL 8.153 (H)      Triiodothyronine,Free,Serum Latest Ref Range: 2.0 - 4.4 pg/mL     2.2  T4,Free(Direct) Latest Ref Range: 0.61 - 1.12 ng/dL 5.42       Results for DALAL, HATEM (MRN [id number]) as of 06/25/2017 08:38  Ref. Range 06/22/2017 06:07  PTH, Intact Latest Ref Range: 15 - 65 pg/mL 36    PHYSICAL EXAM:   Gen: awake and alert, resting comfortably, NAD, appears well, eating breakfast  Ext:       Right Lower Extremity              dressing c/d/i   Dressings removed  Incisions look fantastic   No signs of infection   No drainage              Ext warm             + DP pulse             DPN, SPN, TN sensation intact              EHL, FHL, lesser toe motor intact             No DCT                        Compartments are soft   Assessment/Plan: 3 Days Post-Op    Principal Problem:   Closed bicondylar fracture of right tibial plateau Active Problems:   Down's syndrome   Rheumatoid arthritis (HCC)   Atlantoaxial instability   Elevated TSH   Anti-infectives    Start     Dose/Rate Route Frequency Ordered Stop   06/22/17 1845  clindamycin (CLEOCIN) IVPB 600 mg     600 mg 100 mL/hr over 30 Minutes Intravenous Every 6 hours 06/22/17 1619 06/23/17 1850   06/22/17 1030  clindamycin (CLEOCIN) IVPB 900 mg     900 mg 100 mL/hr over 30 Minutes Intravenous  Once 06/21/17 1137 06/22/17 1230    .  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              NWB x 8 weeks             Unrestricted ROM R knee                         Dc knee immobilizer                          Do not think a hinged knee brace will fit well    Encourage range of motion as much as possible   Do not put pillows under knee when in bed or chair   Place pillows under ankle at rest to keep knee in extension or use zero knee bone foam              Ice and elevate             PT/OT             daily dressing changes   Ok to leave wounds open to air once there is no drainage   Ok to clean wounds with soap and water only              SNF today    - chronic L knee pain              s/p steroid injection in OR              60 mg depo medrol, 2 cc lidocaine 1% plain and 2 cc 0.5% marcaine plain             WBAT             ROM as tolerated              Ice prn    - Pain management:             continue with current regimen      - ABL anemia/Hemodynamics             stable    - Medical issues              Home meds continued    - DVT/PE prophylaxis:             lovenox and scds             lovenox x 3 weeks post op      - ID:              periop abx completed    -  Metabolic Bone Disease:             PTH normal              Vitamin d is ok                TSH is markedly elevated                         May be related to acute injury and hospitalization but could also be suggestive of active thyroid dysfunction  T4 is normal                          T3 is normal                          Will need follow up with PCP    - Activity:             NWB R leg              Unrestricted ROM R knee    - FEN/GI prophylaxis/Foley/Lines:           Reg diet    - Dispo:             dc to snf today  Follow up with ortho in 14 days       Mearl Latin, PA-C [facility] [phone] ([phone] (O) 06/25/2017, 8:41 AM

## 2017-06-25 NOTE — Discharge Instructions (Signed)
Orthopaedic Trauma Service Discharge Instructions   General Discharge Instructions  WEIGHT BEARING STATUS: Nonweightbearing Right Lower extremity   RANGE OF MOTION/ACTIVITY: unrestricted range of motion of Right knee and ankle   No pillows under bend of knee when at rest. Elevate leg by placing pillows under ankle or using zero knee bone foam   Wound Care: daily wound care starting on 06/26/2017. See instructions below  Discharge Wound Care Instructions  Do NOT apply any ointments, solutions or lotions to pin sites or surgical wounds.  These prevent needed drainage and even though solutions like hydrogen peroxide kill bacteria, they also damage cells lining the pin sites that help fight infection.  Applying lotions or ointments can keep the wounds moist and can cause them to breakdown and open up as well. This can increase the risk for infection. When in doubt call the office.  Surgical incisions should be dressed daily.  If any drainage is noted, use one layer of adaptic, then gauze, Kerlix, and an ace wrap. You can also use 4x4s and tape   Once the incision is completely dry and without drainage, it may be left open to air out.  Showering may begin 36-48 hours later.  Cleaning gently with soap and water.   PAIN MEDICATION USE AND EXPECTATIONS  You have likely been given narcotic medications to help control your pain.  After a traumatic event that results in an fracture (broken bone) with or without surgery, it is ok to use narcotic pain medications to help control one's pain.  We understand that everyone responds to pain differently and each individual patient will be evaluated on a regular basis for the continued need for narcotic medications. Ideally, narcotic medication use should last no more than 6-8 weeks (coinciding with fracture healing).   As a patient it is your responsibility as well to monitor narcotic medication use and report the amount and frequency you use these medications  when you come to your office visit.   We would also advise that if you are using narcotic medications, you should take a dose prior to therapy to maximize you participation.  IF YOU ARE ON NARCOTIC MEDICATIONS IT IS NOT PERMISSIBLE TO OPERATE A MOTOR VEHICLE (MOTORCYCLE/CAR/TRUCK/MOPED) OR HEAVY MACHINERY DO NOT MIX NARCOTICS WITH OTHER CNS (CENTRAL NERVOUS SYSTEM) DEPRESSANTS SUCH AS ALCOHOL  Diet: as you were eating previously.  Can use over the counter stool softeners and bowel preparations, such as Miralax, to help with bowel movements.  Narcotics can be constipating.  Be sure to drink plenty of fluids    STOP SMOKING OR USING NICOTINE PRODUCTS!!!!  As discussed nicotine severely impairs your body's ability to heal surgical and traumatic wounds but also impairs bone healing.  Wounds and bone heal by forming microscopic blood vessels (angiogenesis) and nicotine is a vasoconstrictor (essentially, shrinks blood vessels).  Therefore, if vasoconstriction occurs to these microscopic blood vessels they essentially disappear and are unable to deliver necessary nutrients to the healing tissue.  This is one modifiable factor that you can do to dramatically increase your chances of healing your injury.    (This means no smoking, no nicotine gum, patches, etc)  DO NOT USE NONSTEROIDAL ANTI-INFLAMMATORY DRUGS (NSAID'S)  Using products such as Advil (ibuprofen), Aleve (naproxen), Motrin (ibuprofen) for additional pain control during fracture healing can delay and/or prevent the healing response.  If you would like to take over the counter (OTC) medication, Tylenol (acetaminophen) is ok.  However, some narcotic medications that are given for pain  control contain acetaminophen as well. Therefore, you should not exceed more than 4000 mg of tylenol in a day if you do not have liver disease.  Also note that there are may OTC medicines, such as cold medicines and allergy medicines that my contain tylenol as well.  If  you have any questions about medications and/or interactions please ask your doctor/PA or your pharmacist.      ICE AND ELEVATE INJURED/OPERATIVE EXTREMITY  Using ice and elevating the injured extremity above your heart can help with swelling and pain control.  Icing in a pulsatile fashion, such as 20 minutes on and 20 minutes off, can be followed.    Do not place ice directly on skin. Make sure there is a barrier between to skin and the ice pack.    Using frozen items such as frozen peas works well as the conform nicely to the are that needs to be iced.  USE AN ACE WRAP OR TED HOSE FOR SWELLING CONTROL  In addition to icing and elevation, Ace wraps or TED hose are used to help limit and resolve swelling.  It is recommended to use Ace wraps or TED hose until you are informed to stop.    When using Ace Wraps start the wrapping distally (farthest away from the body) and wrap proximally (closer to the body)   Example: If you had surgery on your leg or thing and you do not have a splint on, start the ace wrap at the toes and work your way up to the thigh        If you had surgery on your upper extremity and do not have a splint on, start the ace wrap at your fingers and work your way up to the upper arm  IF YOU ARE IN A SPLINT OR CAST DO NOT REMOVE IT FOR ANY REASON   If your splint gets wet for any reason please contact the office immediately. You may shower in your splint or cast as long as you keep it dry.  This can be done by wrapping in a cast cover or garbage back (or similar)  Do Not stick any thing down your splint or cast such as pencils, money, or hangers to try and scratch yourself with.  If you feel itchy take benadryl as prescribed on the bottle for itching  IF YOU ARE IN A CAM BOOT (BLACK BOOT)  You may remove boot periodically. Perform daily dressing changes as noted below.  Wash the liner of the boot regularly and wear a sock when wearing the boot. It is recommended that you sleep in  the boot until told otherwise  CALL THE OFFICE WITH ANY QUESTIONS OR CONCERNS: [phone]

## 2017-06-25 NOTE — Progress Notes (Signed)
Called report to [facility] and talked to [employer]. Reviewed AVS with patient and patient's dad.  Patient is stable and ready for discharge.  Waiting on transport.

## 2017-06-25 NOTE — Progress Notes (Signed)
PT Cancellation Note  Patient Details Name: Marissa Gray MRN: [id number] DOB: [birth year]   Cancelled Treatment:    Reason Eval/Treat Not Completed: Other (comment) (Pt eating lunch at the moment.  Plan to d/c to [facility] at 1:30 pm.  Will defer PT to next level of care.  )   [name] Artis Delay 06/25/2017, 12:31 PM  Joycelyn Rua, PTA pager [phone]

## 2017-06-28 ENCOUNTER — Other Ambulatory Visit: Payer: Self-pay

## 2017-06-28 ENCOUNTER — Non-Acute Institutional Stay (SKILLED_NURSING_FACILITY): Payer: Medicaid Other | Admitting: Adult Health

## 2017-06-28 ENCOUNTER — Encounter: Payer: Self-pay | Admitting: Adult Health

## 2017-06-28 DIAGNOSIS — K219 Gastro-esophageal reflux disease without esophagitis: Secondary | ICD-10-CM | POA: Diagnosis not present

## 2017-06-28 DIAGNOSIS — M069 Rheumatoid arthritis, unspecified: Secondary | ICD-10-CM

## 2017-06-28 DIAGNOSIS — K5901 Slow transit constipation: Secondary | ICD-10-CM

## 2017-06-28 DIAGNOSIS — J45909 Unspecified asthma, uncomplicated: Secondary | ICD-10-CM | POA: Diagnosis not present

## 2017-06-28 DIAGNOSIS — S82141K Displaced bicondylar fracture of right tibia, subsequent encounter for closed fracture with nonunion: Secondary | ICD-10-CM | POA: Diagnosis not present

## 2017-06-28 DIAGNOSIS — K5900 Constipation, unspecified: Secondary | ICD-10-CM | POA: Insufficient documentation

## 2017-06-28 DIAGNOSIS — E876 Hypokalemia: Secondary | ICD-10-CM

## 2017-06-28 MED ORDER — OXYCODONE HCL 5 MG PO TABS: 5.0000 mg | ORAL_TABLET | Freq: Four times a day (QID) | ORAL | 0 refills | Status: DC | PRN

## 2017-06-28 NOTE — Telephone Encounter (Signed)
RX faxed to [facility] @ [phone], phone number [phone] 

## 2017-06-28 NOTE — Progress Notes (Signed)
Location:   [facility] Room Number: 117 A Place of Service:  SNF (31)   CODE STATUS: Full Code  Allergies  Allergen Reactions  . Ceclor [Cefaclor] Hives  . Penicillins Hives     Has patient had a PCN reaction causing immediate rash, facial/tongue/throat swelling, SOB or lightheadedness with hypotension: Unknown Has patient had a PCN reaction causing severe rash involving mucus membranes or skin necrosis: Unknown Has patient had a PCN reaction that required hospitalization: Unknown Has patient had a PCN reaction occurring within the last 10 years: Unknown If all of the above answers are "NO", then may proceed with Cephalosporin use.  . Sulfa Antibiotics Hives and Other (See Comments)    FEVER    Chief Complaint  Patient presents with  . Hospitalization Follow-up    Hospital follow up    HPI:  She had a fall getting off the bus. She did go to urgent care and placed in an immobilizer. Her needs were able to be met there and she was brought to ED for further evaluation. She was found to have a right closed bicondylar tibial plateau fracture and had an orif with extensive bone grafting using multiple infuse sponges and cancellous allograft. Anterior compartment fasciotomy and left knee steroid injection.  She is here short rehab and will then return back home.    Past Medical History:  Diagnosis Date  . 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 Closed bicondylar fracture of right tibial plateau 06/20/2017  . 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 Down's syndrome   . GERD (gastroesophageal reflux disease)   . Rheumatoid arthritis ([place])   . Strabismus     Past Surgical History:  Procedure Laterality Date  . CARDIAC SURGERY    . FOOT ARTHRODESIS, TRIPLE    . ORIF TIBIA PLATEAU Right 06/22/2017   Procedure: OPEN REDUCTION INTERNAL FIXATION (ORIF) TIBIAL PLATEAU;  Surgeon: Altamese [place], MD;  Location: [facility];  Service: Orthopedics;   Laterality: Right;    Social History   Social History  . Marital status: Single    Spouse name: N/A  . Number of children: N/A  . Years of education: N/A   Occupational History  . Not on file.   Social History Main Topics  . Smoking status: Never Smoker  . Smokeless tobacco: Never Used  . Alcohol use No  . Drug use: No  . Sexual activity: Not on file   Other Topics Concern  . Not on file   Social History Narrative  . No narrative on file   History reviewed. No pertinent family history.    VITAL SIGNS BP 132/86   Pulse 78   Temp 98.9 F (37.2 C)   Resp 17   Ht _0  (1.372 m)   Wt 200 lb 6.4 oz (90.9 kg)   LMP  (LMP Unknown) Comment: Negative preg test 06/20/17  SpO2 97%   BMI 48.32 kg/m   Patient's Medications  New Prescriptions   No medications on file  Previous Medications   ACETAMINOPHEN (TYLENOL) 500 MG TABLET    Take 1-2 tablets (500-1,000 mg total) by mouth every 6 (six) hours as needed for mild pain or moderate pain.   ALBUTEROL (PROVENTIL HFA;VENTOLIN HFA) 108 (90 BASE) MCG/ACT INHALER    Inhale 2 puffs into the lungs every 4 (four) hours as needed for wheezing or shortness of breath.   DOCUSATE SODIUM (COLACE) 100 MG CAPSULE    Take 1 capsule (100 mg total)  by mouth 2 (two) times daily.   ENOXAPARIN (LOVENOX) 40 MG/0.4ML INJECTION    Inject 0.4 mLs (40 mg total) into the skin daily.   ESOMEPRAZOLE (NEXIUM) 40 MG CAPSULE    Take 40 mg by mouth daily at 12 noon.   EYELID CLEANSERS (OCUSOFT BABY EYELID & EYELASH) PADS    Apply 1 application topically 2 (two) times daily.   FOLIC ACID (FOLVITE) 1 MG TABLET    Take 2 mg by mouth daily.   LEVONORGESTREL-ETHINYL ESTRADIOL (VIENVA) 0.1-20 MG-MCG TABLET    Take 1 tablet by mouth daily.   METHOCARBAMOL (ROBAXIN) 500 MG TABLET    Take 1-2 tablets (500-1,000 mg total) by mouth every 6 (six) hours as needed for muscle spasms.   METHOTREXATE 2.5 MG TABLET    Take 7.5-10 mg by mouth once a week. On 7.5 mg on Thursday  mornings and 10 mg on Thursday evenings   MICONAZOLE (ZEASORB-AF) 2 % POWDER    Apply 1 application topically 2 (two) times daily as needed for itching (under stomach).   MONTELUKAST (SINGULAIR) 10 MG TABLET    Take 10 mg by mouth every evening.   MULTIPLE VITAMIN (MULTIVITAMIN WITH MINERALS) TABS TABLET    Take 1 tablet by mouth daily.   MUPIROCIN OINTMENT (BACTROBAN) 2 %    Apply 1 application topically daily.   NABUMETONE (RELAFEN) 500 MG TABLET    Take 500 mg by mouth 2 (two) times daily.   OXYCODONE (OXY IR/ROXICODONE) 5 MG IMMEDIATE RELEASE TABLET    Take 1-2 tablets (5-10 mg total) by mouth every 6 (six) hours as needed for severe pain or breakthrough pain.   POTASSIUM CHLORIDE (MICRO-K) 10 MEQ CR CAPSULE    Take 10 mEq by mouth 2 (two) times daily.   PSYLLIUM (METAMUCIL) 58.6 % PACKET    Take 1 packet by mouth daily.   TRIAMCINOLONE OINTMENT (KENALOG) 0.1 %    Apply 1 application topically 2 (two) times daily as needed (rash).  Modified Medications   No medications on file  Discontinued Medications   BECLOMETHASONE (QVAR) 40 MCG/ACT INHALER    Inhale 1 puff into the lungs 2 (two) times daily.   KETOTIFEN (ZADITOR) 0.025 % OPHTHALMIC SOLUTION    Apply 1 drop to eye 2 (two) times daily.   LEVOCETIRIZINE (XYZAL) 5 MG TABLET    Take 5 mg by mouth every evening.   SODIUM CHLORIDE (OCEAN) 0.65 % SOLN NASAL SPRAY    Place 1-2 sprays into both nostrils 2 (two) times daily.     SIGNIFICANT DIAGNOSTIC EXAMS  06-20-17: pelvic x-ray: 1. No evidence of pelvic fracture. 2. No evidence of femoral neck fracture on suboptimal exam  06-20-17: ct of right knee: Comminuted impacted fracture of the proximal tibia as described. Comminuted fracture of the fibular head.    06-20-17: cervical spine x-ray: 1. There is subluxation between C1 and C2, which may be ligamentous laxity or potentially from a type 2 odontoid fracture. The odontoid is not well-defined radiographically.  06-21-17: ct of cervical spine:  1. Advanced degenerative changes from the skullbase to C2 appear related to chronic atlanto-occipital AND atlanto-axial instability. There is associated anterior and left lateral subluxation of C1 on C2 with up to severe joint space loss, and significant degenerative osseous remodeling about the anterior C1-odontoid articulation. 2. 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 3. Superimposed more conventional cervical spine degeneration, including mild spondylolisthesis, from C2-C3 to C4-C5, with up to mild additional degenerative spinal stenosis.  06-22-17: right knee x-ray Open reduction and internal fixation of right tibial plateau fracture   LABS REVIEWED:   06-20-17: wbc 9.2; hgb 12.6; hct 36.6; mcv 110.9; plt 307; glucose 120; bun 14; creat 0.64 ;k+ 3.7; na++ 143; ca 8.7; liver normal albumin 3.5 06-22-17: wbc 7.1; hgb 10.0; hct 30.3; mcv 113.5; plt 253; glucose 91' bun 31; creat 0.56; k+ 4.1; na++ 141; ca 7.8; liver normal albumin 2.5; mag 1.9; phos 3.4 pre-albumin 20.3; vit D 32.3; calcittriol 59.2; ionized ca 4.6; pth 36; tsh  4.153; free T 4: 0.88 hgb a1c 5.0 06-24-17: wbc 9.0; hgb 9.4; hct 28.8 ;mcv 114.7; plt 255; glucose 99; bun 10; create 0.59; k+ 4.4 ;na++ 138; ca 8.0 free T 3: 2.3    Review of Systems  Constitutional: Negative for malaise/fatigue.  Respiratory: Negative for cough and shortness of breath.   Cardiovascular: Negative for chest pain, palpitations and leg swelling.  Gastrointestinal: Negative for abdominal pain, constipation and heartburn.  Musculoskeletal: Negative for back pain, joint pain and myalgias.       Denies right leg pain   Skin: Negative.   Neurological: Negative for dizziness.  Psychiatric/Behavioral: The patient is not nervous/anxious.     Physical Exam  Constitutional: She is oriented to person, place, and time. No distress.  Obese   Eyes: Conjunctivae are normal.  Neck: Neck supple. No JVD present. No thyromegaly  present.  Cardiovascular: Normal rate, regular rhythm and intact distal pulses.   Respiratory: Effort normal and breath sounds normal. No respiratory distress. She has no wheezes.  GI: Soft. Bowel sounds are normal. She exhibits no distension. There is no tenderness.  Musculoskeletal: She exhibits edema.  Able to move all extremities  Is non weight bearing on right lower extremity 2+ right lower extremity edema   Lymphadenopathy:    She has no cervical adenopathy.  Neurological: She is alert and oriented to person, place, and time.  Skin: Skin is warm and dry. She is not diaphoretic.  surgical dressing to right lower leg intact with scant bloody drainage present.   Psychiatric: She has a normal mood and affect.     ASSESSMENT/ PLAN:  1. RA: will continue folic acid 2 mg daily  will continue methotrexate 7.5 mg in AM and 10 mg PM weekly and relafen 500 mg twice daily   2. GERD: will continue nexium 40 mg daily   3. Hypokalemia: k+ 4.4 will continue k+ 10 meq twice daily   4. Constipation: will continue colace twice daily and metamucil daily   5. Right closed bicondylar tibial plateau fracture: is status post ORIF: will continue to follow up with orthopedics; is non weight bearing on right lower extremity; will continue therapy as directed; she prefers tylenol 500 mg or 1000 mg every 6 hours as needed; has oxycodone 5 or 10 mg every 6 hours as needed has robaxin 500 mg or 1000 mg every 6 hours as needed will continue lovenox through 07-14-17.   6. Asthma: will continue beclomethasone 1 puff twice daily singular 10 mg daily and albuterol 2 puffs every 4 hours as needed   7. Women's health: will continue vienva 0.1-20 mg/mcg daily    Time spent with patient 50    minutes >50% time spent counseling; reviewing medical record; tests; labs; and developing future plan of care   MD is aware of resident's narcotic use and is in agreement with current plan of care. We will attempt to wean  resident as apropriate   Neoma Laming  Payge Eppes NP [facility]  Contact [phone] Monday through Friday 8am- 5pm  After hours call [phone]

## 2017-07-01 ENCOUNTER — Encounter: Payer: Self-pay | Admitting: Internal Medicine

## 2017-07-01 ENCOUNTER — Non-Acute Institutional Stay (SKILLED_NURSING_FACILITY): Payer: Medicaid Other | Admitting: Internal Medicine

## 2017-07-01 DIAGNOSIS — K5901 Slow transit constipation: Secondary | ICD-10-CM | POA: Diagnosis not present

## 2017-07-01 DIAGNOSIS — M25562 Pain in left knee: Secondary | ICD-10-CM | POA: Diagnosis not present

## 2017-07-01 DIAGNOSIS — Z967 Presence of other bone and tendon implants: Secondary | ICD-10-CM

## 2017-07-01 DIAGNOSIS — G8929 Other chronic pain: Secondary | ICD-10-CM

## 2017-07-01 DIAGNOSIS — Z8781 Personal history of (healed) traumatic fracture: Secondary | ICD-10-CM | POA: Diagnosis not present

## 2017-07-01 DIAGNOSIS — Q909 Down syndrome, unspecified: Secondary | ICD-10-CM | POA: Diagnosis not present

## 2017-07-01 DIAGNOSIS — J45909 Unspecified asthma, uncomplicated: Secondary | ICD-10-CM

## 2017-07-01 DIAGNOSIS — Z9889 Other specified postprocedural states: Secondary | ICD-10-CM

## 2017-07-01 DIAGNOSIS — M069 Rheumatoid arthritis, unspecified: Secondary | ICD-10-CM | POA: Diagnosis not present

## 2017-07-01 DIAGNOSIS — K219 Gastro-esophageal reflux disease without esophagitis: Secondary | ICD-10-CM

## 2017-07-01 DIAGNOSIS — E876 Hypokalemia: Secondary | ICD-10-CM | POA: Diagnosis not present

## 2017-07-01 NOTE — Progress Notes (Signed)
HISTORY AND PHYSICAL   DATE:  July 01, 2017  Location:   [place] Room Number: [place] of Service: SNF (31)   Extended Emergency Contact Information Primary Emergency Contact: Kreischer,Patricia Address: [address]          [place] [address] [place] Mobile Phone: [phone] Relation: Mother Secondary Emergency Contact: Lilli Light [place] Mobile Phone: [phone] Relation: Other  Advanced Directive information Does Patient Have a Medical Advance Directive?: No, Would patient like information on creating a medical advance directive?: No - Patient declined  Chief Complaint  Patient presents with  . New Admit To SNF    Admission    HPI:  36 yo female seen today as a new admission into SNF following hospital stay for acute/chronic bicondylar tibial plateau fx, elevated TSH, Down's syndrome, RA, AA instability, GERD, chronic left knee pain. She was taken to the OR by  Ortho Dr Marcelino Scot 06/22/17 for right ORIF of bicondylar tibial plateau fx with extensive bone grafting using multiple Infuse sponges and cancellous allograft; fasciotomy of Anterior compartment; and Left knee steroid injection. She was given lovenox for DVT prophylaxis. Albumin 2.5; Hgb 12.6-->9.4; Vit D 25OH level 32.3; TSH 8.153 at d/c. She presents to SNF for short term rehab.  Today she reports pain well controlled and is a 2/10 on scale.  She is NWB RLE x 8 weeks. She is tolerating tx.  She has not had f/u with Ortho Dr Marcelino Scot yet. She would like to go to camp in August but is unsure if she will be able to attend due to recent sx. She is a poor historian due to psych d/o. Hx obtained from chart.she will be on lovenox until 07/14/17   RA - stable on folic acid 2 mg daily; methotrexate 7.5 mg in AM and 10 mg PM weekly; relafen 500 mg twice daily   GERD - stable on nexium 40 mg daily   Hypokalemia - stable on KCl 10 meq BID. K 4.4   Constipation - stable on  colace twice daily and metamucil daily   Asthma - stable on beclomethasone 1 puff twice daily; singular 10 mg daily; albuterol 2 puffs every 4 hours as needed. No recent exacerbations  Women's health - stable on vienva 0.1-20 mg/mcg daily    Past Medical History:  Diagnosis Date  . 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 Closed bicondylar fracture of right tibial plateau 06/20/2017  . 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 Down's syndrome   . GERD (gastroesophageal reflux disease)   . Rheumatoid arthritis ([place])   . Strabismus     Past Surgical History:  Procedure Laterality Date  . CARDIAC SURGERY    . FOOT ARTHRODESIS, TRIPLE    . ORIF TIBIA PLATEAU Right 06/22/2017   Procedure: OPEN REDUCTION INTERNAL FIXATION (ORIF) TIBIAL PLATEAU;  Surgeon: Altamese Hilldale, MD;  Location: [place] [facility];  Service: Orthopedics;  Laterality: Right;    Patient Care Team: Carol Ada, MD as PCP - General (Family Medicine)  Social History   Social History  . Marital status: Single    Spouse name: N/A  . Number of children: N/A  . Years of education: N/A   Occupational History  . Not on file.   Social History Main Topics  . Smoking status: Never Smoker  . Smokeless tobacco: Never Used  . Alcohol use No  . Drug use: No  . Sexual activity: Not on file  Other Topics Concern  . Not on file   Social History Narrative  . No narrative on file     reports that she has never smoked. She has never used smokeless tobacco. She reports that she does not drink alcohol or use drugs.  History reviewed. No pertinent family history. No family status information on file.    Immunization History  Administered Date(s) Administered  . PPD Test 06/26/2017    Allergies  Allergen Reactions  . Ceclor [Cefaclor] Hives  . Penicillins Hives     Has patient had a PCN reaction causing immediate rash, facial/tongue/throat swelling, SOB or lightheadedness with hypotension:  Unknown Has patient had a PCN reaction causing severe rash involving mucus membranes or skin necrosis: Unknown Has patient had a PCN reaction that required hospitalization: Unknown Has patient had a PCN reaction occurring within the last 10 years: Unknown If all of the above answers are "NO", then may proceed with Cephalosporin use.  . Sulfa Antibiotics Hives and Other (See Comments)    FEVER    Medications: Patient's Medications  New Prescriptions   No medications on file  Previous Medications   ACETAMINOPHEN (TYLENOL) 500 MG TABLET    Take 1-2 tablets (500-1,000 mg total) by mouth every 6 (six) hours as needed for mild pain or moderate pain.   ALBUTEROL (PROVENTIL HFA;VENTOLIN HFA) 108 (90 BASE) MCG/ACT INHALER    Inhale 2 puffs into the lungs every 4 (four) hours as needed for wheezing or shortness of breath.   BECLOMETHASONE (QVAR) 40 MCG/ACT INHALER    Inhale 1 puff into the lungs 2 (two) times daily.   DOCUSATE SODIUM (COLACE) 100 MG CAPSULE    Take 1 capsule (100 mg total) by mouth 2 (two) times daily.   ENOXAPARIN (LOVENOX) 40 MG/0.4ML INJECTION    Inject 0.4 mLs (40 mg total) into the skin daily.   ESOMEPRAZOLE (NEXIUM) 40 MG CAPSULE    Take 40 mg by mouth daily at 12 noon.   EYELID CLEANSERS (OCUSOFT BABY EYELID & EYELASH) PADS    Apply 1 application topically 2 (two) times daily.   FOLIC ACID (FOLVITE) 1 MG TABLET    Take 2 mg by mouth daily.   KETOTIFEN (ZADITOR) 0.025 % OPHTHALMIC SOLUTION    Place 1 drop into both eyes 2 (two) times daily.   LEVONORGESTREL-ETHINYL ESTRADIOL (VIENVA) 0.1-20 MG-MCG TABLET    Take 1 tablet by mouth daily.   METHOCARBAMOL (ROBAXIN) 500 MG TABLET    Take 1-2 tablets (500-1,000 mg total) by mouth every 6 (six) hours as needed for muscle spasms.   METHOTREXATE 2.5 MG TABLET    Take 7.5-10 mg by mouth once a week. On 7.5 mg on Thursday mornings and 10 mg on Thursday evenings   MICONAZOLE (ZEASORB-AF) 2 % POWDER    Apply 1 application topically 2 (two)  times daily as needed for itching (under stomach).   MONTELUKAST (SINGULAIR) 10 MG TABLET    Take 10 mg by mouth every evening.   MULTIPLE VITAMIN (MULTIVITAMIN WITH MINERALS) TABS TABLET    Take 1 tablet by mouth daily.   MUPIROCIN OINTMENT (BACTROBAN) 2 %    Apply 1 application topically daily.   NABUMETONE (RELAFEN) 500 MG TABLET    Take 500 mg by mouth 2 (two) times daily.   OXYCODONE (OXY IR/ROXICODONE) 5 MG IMMEDIATE RELEASE TABLET    Take 1 tablet (5 mg total) by mouth every 6 (six) hours as needed for severe pain or breakthrough pain.  POTASSIUM CHLORIDE (MICRO-K) 10 MEQ CR CAPSULE    Take 10 mEq by mouth 2 (two) times daily.   PSYLLIUM (METAMUCIL) 58.6 % PACKET    Take 1 packet by mouth daily.   SODIUM CHLORIDE (OCEAN FOR KIDS) 0.65 % NASAL SPRAY    Place 1-2 sprays into the nose 2 (two) times daily.   TRIAMCINOLONE OINTMENT (KENALOG) 0.1 %    Apply 1 application topically 2 (two) times daily as needed (rash).  Modified Medications   No medications on file  Discontinued Medications   No medications on file    Review of Systems  Unable to perform ROS: Psychiatric disorder    Vitals:   07/01/17 0940  BP: 132/86  Pulse: 78  Resp: 17  Temp: 98.9 F (37.2 C)  SpO2: 97%  Weight: 196 lb 12.8 oz (89.3 kg)  Height: 4' 6" (1.372 m)   Body mass index is 47.45 kg/m.  Physical Exam  Constitutional: She appears well-developed and well-nourished.  HENT:  Mouth/Throat: Oropharynx is clear and moist. No oropharyngeal exudate.  MMM; no oral thrush; poor dentition  Eyes: Pupils are equal, round, and reactive to light. No scleral icterus.  Neck: Neck supple. Carotid bruit is not present. No tracheal deviation present. No thyromegaly present.  Cardiovascular: Normal rate, regular rhythm and intact distal pulses.  Exam reveals no gallop and no friction rub.   Murmur (1/6 SEM) heard. Trace RLE  Edema but no LLE edema. Min RLE calf TTP but no left calf TTP. Distal pulses palpable    Pulmonary/Chest: Effort normal and breath sounds normal. No stridor. No respiratory distress. She has no wheezes. She has no rales. She exhibits no tenderness.  Abdominal: Soft. Normal appearance and bowel sounds are normal. She exhibits no distension and no mass. There is no hepatomegaly. There is no tenderness. There is no rigidity, no rebound and no guarding. No hernia.  Musculoskeletal: She exhibits edema and tenderness.  Right anterior leg dsg dirty with dried blood but intact. Medial knee sutures intact without redness or d/c. Trace distal swelling of leg. No LLE swelling/edema  Lymphadenopathy:    She has no cervical adenopathy.  Neurological: She is alert.  Skin: Skin is warm and dry. No rash noted.  Psychiatric: She has a normal mood and affect. Her behavior is normal.     Labs reviewed: Admission on 06/20/2017, Discharged on 06/25/2017  Component Date Value Ref Range Status  . Color, Urine 06/20/2017 YELLOW  YELLOW Final  . APPearance 06/20/2017 CLEAR  CLEAR Final  . Specific Gravity, Urine 06/20/2017 1.018  1.005 - 1.030 Final  . pH 06/20/2017 5.0  5.0 - 8.0 Final  . Glucose, UA 06/20/2017 NEGATIVE  NEGATIVE mg/dL Final  . Hgb urine dipstick 06/20/2017 MODERATE* NEGATIVE Final  . Bilirubin Urine 06/20/2017 NEGATIVE  NEGATIVE Final  . Ketones, ur 06/20/2017 NEGATIVE  NEGATIVE mg/dL Final  . Protein, ur 06/20/2017 NEGATIVE  NEGATIVE mg/dL Final  . Nitrite 06/20/2017 NEGATIVE  NEGATIVE Final  . Leukocytes, UA 06/20/2017 NEGATIVE  NEGATIVE Final  . RBC / HPF 06/20/2017 0-5  0 - 5 RBC/hpf Final  . WBC, UA 06/20/2017 0-5  0 - 5 WBC/hpf Final  . Bacteria, UA 06/20/2017 NONE SEEN  NONE SEEN Final  . Squamous Epithelial / LPF 06/20/2017 0-5* NONE SEEN Final  . Mucous 06/20/2017 PRESENT   Final  . Preg Test, Ur 06/20/2017 NEGATIVE  NEGATIVE Final   Comment:        THE SENSITIVITY OF THIS METHODOLOGY  IS >20 mIU/mL.   . WBC 06/20/2017 9.2  4.0 - 10.5 K/uL Final  . RBC 06/20/2017  3.30* 3.87 - 5.11 MIL/uL Final  . Hemoglobin 06/20/2017 12.6  12.0 - 15.0 g/dL Final  . HCT 06/20/2017 36.6  36.0 - 46.0 % Final  . MCV 06/20/2017 110.9* 78.0 - 100.0 fL Final  . MCH 06/20/2017 38.2* 26.0 - 34.0 pg Final  . MCHC 06/20/2017 34.4  30.0 - 36.0 g/dL Final  . RDW 06/20/2017 14.0  11.5 - 15.5 % Final  . Platelets 06/20/2017 307  150 - 400 K/uL Final  . Neutrophils Relative % 06/20/2017 79  % Final  . Neutro Abs 06/20/2017 7.2  1.7 - 7.7 K/uL Final  . Lymphocytes Relative 06/20/2017 19  % Final  . Lymphs Abs 06/20/2017 1.7  0.7 - 4.0 K/uL Final  . Monocytes Relative 06/20/2017 3  % Final  . Monocytes Absolute 06/20/2017 0.2  0.1 - 1.0 K/uL Final  . Eosinophils Relative 06/20/2017 0  % Final  . Eosinophils Absolute 06/20/2017 0.0  0.0 - 0.7 K/uL Final  . Basophils Relative 06/20/2017 0  % Final  . Basophils Absolute 06/20/2017 0.0  0.0 - 0.1 K/uL Final  . Sodium 06/20/2017 143  135 - 145 mmol/L Final  . Potassium 06/20/2017 3.7  3.5 - 5.1 mmol/L Final  . Chloride 06/20/2017 111  101 - 111 mmol/L Final  . CO2 06/20/2017 22  22 - 32 mmol/L Final  . Glucose, Bld 06/20/2017 120* 65 - 99 mg/dL Final  . BUN 06/20/2017 14  6 - 20 mg/dL Final  . Creatinine, Ser 06/20/2017 0.64  0.44 - 1.00 mg/dL Final  . Calcium 06/20/2017 8.7* 8.9 - 10.3 mg/dL Final  . Total Protein 06/20/2017 7.2  6.5 - 8.1 g/dL Final  . Albumin 06/20/2017 3.5  3.5 - 5.0 g/dL Final  . AST 06/20/2017 36  15 - 41 U/L Final  . ALT 06/20/2017 29  14 - 54 U/L Final  . Alkaline Phosphatase 06/20/2017 55  38 - 126 U/L Final  . Total Bilirubin 06/20/2017 0.8  0.3 - 1.2 mg/dL Final  . GFR calc non Af Amer 06/20/2017 >60  >60 mL/min Final  . GFR calc Af Amer 06/20/2017 >60  >60 mL/min Final   Comment: (NOTE) The eGFR has been calculated using the CKD EPI equation. This calculation has not been validated in all clinical situations. eGFR's persistently <60 mL/min signify possible Chronic Kidney Disease.   . Anion gap  06/20/2017 10  5 - 15 Final  . ABO/RH(D) 06/20/2017 O POS   Final  . Antibody Screen 06/20/2017 NEG   Final  . Sample Expiration 06/20/2017 06/23/2017   Final  . ABO/RH(D) 06/20/2017 O POS   Final  . HIV Screen 4th Generation wRfx 06/20/2017 Non Reactive  Non Reactive Final   Comment: (NOTE) Performed At: [facility] [place], [place] [id number] Lindon Romp MD [id number]   . WBC 06/22/2017 7.1  4.0 - 10.5 K/uL Final  . RBC 06/22/2017 2.67* 3.87 - 5.11 MIL/uL Final  . Hemoglobin 06/22/2017 10.0* 12.0 - 15.0 g/dL Final  . HCT 06/22/2017 30.3* 36.0 - 46.0 % Final  . MCV 06/22/2017 113.5* 78.0 - 100.0 fL Final  . MCH 06/22/2017 37.5* 26.0 - 34.0 pg Final  . MCHC 06/22/2017 33.0  30.0 - 36.0 g/dL Final  . RDW 06/22/2017 14.0  11.5 - 15.5 % Final  . Platelets 06/22/2017 253  150 - 400 K/uL Final  .  Sodium 06/22/2017 141  135 - 145 mmol/L Final  . Potassium 06/22/2017 4.1  3.5 - 5.1 mmol/L Final  . Chloride 06/22/2017 113* 101 - 111 mmol/L Final  . CO2 06/22/2017 23  22 - 32 mmol/L Final  . Glucose, Bld 06/22/2017 91  65 - 99 mg/dL Final  . BUN 06/22/2017 13  6 - 20 mg/dL Final  . Creatinine, Ser 06/22/2017 0.56  0.44 - 1.00 mg/dL Final  . Calcium 06/22/2017 7.8* 8.9 - 10.3 mg/dL Final  . Total Protein 06/22/2017 5.5* 6.5 - 8.1 g/dL Final  . Albumin 06/22/2017 2.5* 3.5 - 5.0 g/dL Final  . AST 06/22/2017 22  15 - 41 U/L Final  . ALT 06/22/2017 21  14 - 54 U/L Final  . Alkaline Phosphatase 06/22/2017 45  38 - 126 U/L Final  . Total Bilirubin 06/22/2017 0.3  0.3 - 1.2 mg/dL Final  . GFR calc non Af Amer 06/22/2017 >60  >60 mL/min Final  . GFR calc Af Amer 06/22/2017 >60  >60 mL/min Final   Comment: (NOTE) The eGFR has been calculated using the CKD EPI equation. This calculation has not been validated in all clinical situations. eGFR's persistently <60 mL/min signify possible Chronic Kidney Disease.   . Anion gap 06/22/2017 5  5 - 15 Final  . Vit D,  25-Hydroxy 06/22/2017 32.3  30.0 - 100.0 ng/mL Final   Comment: (NOTE) Vitamin D deficiency has been defined by the [place] practice guideline as a level of serum 25-OH vitamin D less than 20 ng/mL (1,2). The Endocrine Society went on to further define vitamin D insufficiency as a level between 21 and 29 ng/mL (2). 1. IOM (Institute of Medicine). 2010. Dietary reference   intakes for calcium and D. [place]: The   [employer]. 2. Holick MF, [place], Bischoff-Ferrari HA, et al.   Evaluation, treatment, and prevention of vitamin D   deficiency: an Endocrine Society clinical practice   guideline. JCEM. 2011 Jul; 96(7):1911-30. Performed At: [facility] [place], [place] [id number] Lindon Romp MD AS:[id number]   . Vit D, 1,25-Dihydroxy 06/22/2017 59.2  19.9 - 79.3 pg/mL Final   Comment: (NOTE) Performed At: [facility] [place], [place] [id number] Lindon Romp MD [id number]   . TSH 06/22/2017 8.153* 0.350 - 4.500 uIU/mL Final   Performed by a 3rd Generation assay with a functional sensitivity of <=0.01 uIU/mL.  Marland Kitchen PTH 06/22/2017 36  15 - 65 pg/mL Final  . Calcium, Total (PTH) 06/22/2017 7.5* 8.7 - 10.2 mg/dL Final  . PTH Interp 06/22/2017 Comment   Final   Comment: (NOTE) Interpretation                 Intact PTH    Calcium                                (pg/mL)      (mg/dL) Normal                          15 - 65     8.6 - 10.2 Primary Hyperparathyroidism         >65          >10.2 Secondary Hyperparathyroidism       >65          <10.2 Non-Parathyroid Hypercalcemia       <  65          >10.2 Hypoparathyroidism                  <15          < 8.6 Non-Parathyroid Hypocalcemia    15 - 65          < 8.6 Performed At: [facility] [place], [place] [id number] Lindon Romp MD OK:[id number]   . Calcium, Ionized, Serum 06/22/2017 4.6  4.5 - 5.6  mg/dL Final   Comment: (NOTE) Performed At: [facility] [place], [place] [id number] Lindon Romp MD DH:[id number]   . Prealbumin 06/22/2017 20.3  18 - 38 mg/dL Final  . Magnesium 06/22/2017 1.9  1.7 - 2.4 mg/dL Final  . Phosphorus 06/22/2017 3.4  2.5 - 4.6 mg/dL Final  . Hgb A1c MFr Bld 06/22/2017 5.0  4.8 - 5.6 % Final   Comment: (NOTE)         Pre-diabetes: 5.7 - 6.4         Diabetes: >6.4         Glycemic control for adults with diabetes: <7.0   . Mean Plasma Glucose 06/22/2017 97  mg/dL Final   Comment: (NOTE) Performed At: [facility] [place], [place] [id number] Lindon Romp MD [id number]:[id number]   . Sodium 06/23/2017 138  135 - 145 mmol/L Final  . Potassium 06/23/2017 4.6  3.5 - 5.1 mmol/L Final  . Chloride 06/23/2017 109  101 - 111 mmol/L Final  . CO2 06/23/2017 22  22 - 32 mmol/L Final  . Glucose, Bld 06/23/2017 123* 65 - 99 mg/dL Final  . BUN 06/23/2017 6  6 - 20 mg/dL Final  . Creatinine, Ser 06/23/2017 0.58  0.44 - 1.00 mg/dL Final  . Calcium 06/23/2017 7.9* 8.9 - 10.3 mg/dL Final  . GFR calc non Af Amer 06/23/2017 >60  >60 mL/min Final  . GFR calc Af Amer 06/23/2017 >60  >60 mL/min Final   Comment: (NOTE) The eGFR has been calculated using the CKD EPI equation. This calculation has not been validated in all clinical situations. eGFR's persistently <60 mL/min signify possible Chronic Kidney Disease.   . Anion gap 06/23/2017 7  5 - 15 Final  . WBC 06/23/2017 9.6  4.0 - 10.5 K/uL Final  . RBC 06/23/2017 2.55* 3.87 - 5.11 MIL/uL Final  . Hemoglobin 06/23/2017 9.7* 12.0 - 15.0 g/dL Final   CONSISTENT WITH PREVIOUS RESULT  . HCT 06/23/2017 29.2* 36.0 - 46.0 % Final  . MCV 06/23/2017 114.5* 78.0 - 100.0 fL Final  . MCH 06/23/2017 38.0* 26.0 - 34.0 pg Final  . MCHC 06/23/2017 33.2  30.0 - 36.0 g/dL Final  . RDW 06/23/2017 14.3  11.5 - 15.5 % Final  . Platelets 06/23/2017 244  150 - 400 K/uL Final   CONSISTENT WITH  PREVIOUS RESULT  . Free T4 06/22/2017 0.88  0.61 - 1.12 ng/dL Final   Comment: (NOTE) Biotin ingestion may interfere with free T4 tests. If the results are inconsistent with the TSH level, previous test results, or the clinical presentation, then consider biotin interference. If needed, order repeat testing after stopping biotin.   . Sodium 06/24/2017 138  135 - 145 mmol/L Final  . Potassium 06/24/2017 4.4  3.5 - 5.1 mmol/L Final  . Chloride 06/24/2017 107  101 - 111 mmol/L Final  . CO2 06/24/2017 26  22 - 32 mmol/L Final  . Glucose, Bld 06/24/2017 99  65 - 99 mg/dL Final  . BUN 06/24/2017 10  6 - 20 mg/dL Final  . Creatinine, Ser 06/24/2017 0.59  0.44 - 1.00 mg/dL Final  . Calcium 06/24/2017 8.0* 8.9 - 10.3 mg/dL Final  . GFR calc non Af Amer 06/24/2017 >60  >60 mL/min Final  . GFR calc Af Amer 06/24/2017 >60  >60 mL/min Final   Comment: (NOTE) The eGFR has been calculated using the CKD EPI equation. This calculation has not been validated in all clinical situations. eGFR's persistently <60 mL/min signify possible Chronic Kidney Disease.   . Anion gap 06/24/2017 5  5 - 15 Final  . T3, Free 06/24/2017 2.2  2.0 - 4.4 pg/mL Final   Comment: (NOTE) Performed At: [facility] [place], [place] [id number] Lindon Romp MD [id number]   . WBC 06/24/2017 9.0  4.0 - 10.5 K/uL Final  . RBC 06/24/2017 2.51* 3.87 - 5.11 MIL/uL Final  . Hemoglobin 06/24/2017 9.4* 12.0 - 15.0 g/dL Final  . HCT 06/24/2017 28.8* 36.0 - 46.0 % Final  . MCV 06/24/2017 114.7* 78.0 - 100.0 fL Final  . MCH 06/24/2017 37.5* 26.0 - 34.0 pg Final  . MCHC 06/24/2017 32.6  30.0 - 36.0 g/dL Final  . RDW 06/24/2017 14.7  11.5 - 15.5 % Final  . Platelets 06/24/2017 255  150 - 400 K/uL Final    Dg Cervical Spine With Flex & Extend  Result Date: 06/20/2017 CLINICAL DATA:  Down's Syndrome patient is scheduled for orthopedic leg surgery 2 days from now. ? congenital Atlantoaxial stability s/p  fall yesterday. EXAM: CERVICAL SPINE COMPLETE WITH FLEXION AND EXTENSION VIEWS COMPARISON:  None. FINDINGS: The odontoid is not well-defined on this exam. May be fractured at its base. There is subluxation between C1 and C2. On the flexion images, the anterior arch of C1 lies 11 mm anterior to the anterior margin of the C2 vertebral body. On extension, this decreases to 8 mm. No other evidence of a fracture. No other malalignment or subluxation. Soft tissues are unremarkable. IMPRESSION: 1. There is subluxation between C1 and C2, which may be ligamentous laxity or potentially from a type 2 odontoid fracture. The odontoid is not well-defined radiographically. Recommend follow-up CT scan of the cranial cervical junction for further assessment. Electronically Signed   By: Lajean Manes M.D.   On: 06/20/2017 19:12   Dg Tibia/fibula Right  Result Date: 06/20/2017 CLINICAL DATA:  Fall yesterday with right knee/lower leg pain. EXAM: RIGHT TIBIA AND FIBULA - 2 VIEW COMPARISON:  None. FINDINGS: There is a transverse fracture with comminution involving the proximal tibia metaphysis extending to the articular surface. There is also a displaced transverse fracture of the proximal fibular metaphysis. Findings suggest lateral rotation of the talus with respect to the tibia at the ankle. Orthopedic screw projected over the base of the first metatarsal. IMPRESSION: Displaced transverse fracture with comminution involving the proximal tibia metaphysis extending to the articular surface. Displaced proximal fibular metaphyseal fracture. Possible rotatory subluxation at the tibiotalar joint. Consider ankle series for further evaluation. Electronically Signed   By: Marin Olp M.D.   On: 06/20/2017 14:19   Ct Cervical Spine Wo Contrast  Result Date: 06/21/2017 CLINICAL DATA:  36 year old female with Down syndrome, recent evidence of C1- C2 subluxation, possible type 2 odontoid fracture on cervical radiographs. EXAM: CT  CERVICAL SPINE WITHOUT CONTRAST TECHNIQUE: Multidetector CT imaging of the cervical spine was performed without intravenous contrast. Multiplanar CT image reconstructions were also generated. COMPARISON:  Cervical spine radiographs 06/20/2017. FINDINGS: Alignment: Straightening of cervical lordosis. Mild anterolisthesis of C2 on C3, C4 on C5, and C7 on T1. Bilateral posterior element alignment is within normal limits. 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 Flattened appearance of the C1 lateral masses, with severe lateral C1-C2 joint space loss on the left (coronal image 24) accompanied by subchondral sclerosis and fragmentation. Similar degenerative appearing fragmentation at the anterior C1-odontoid articulation, with an abnormally increased atlanto dens interval (4 mm), and a combination of odontoid hypertrophy and smooth posterior odontoid erosion which appears related to degenerative ligamentous hypertrophy (sagittal image 27). Superimposed somewhat shallow occipital condyles and atlanto-occipital articulation. Combined, there is a degree of basilar invagination at the skullbase (also on sagittal image 27). The skullbase is intact. No odontoid or C2 vertebral fracture. No acute fracture identified in the cervical spine. There is spina bifida occulta at C6. Soft tissues and spinal canal: Mild degenerative appearing spinal stenosis at the cervicomedullary junction and C1 level (series 4, image 26). Degenerative spinal stenosis at C4-C5 appears mild to moderate (series 4, image 49). Negative noncontrast neck soft tissues. Disc levels: In addition to the advanced degenerative changes at the skullbase to C2, there is cervical facet arthropathy related to the multilevel mild spondylolisthesis, including at C2-C3, and chronic cervical disc plus endplate degeneration at C3-C4 and C4-C5. There is vacuum disc at C3-C4. Upper chest: Visible upper thoracic levels are intact.  Negative lung apices. Mild superior mediastinal lipomatosis. Other: Negative visualized noncontrast brain parenchyma. Visualized paranasal sinuses and mastoids are well pneumatized. IMPRESSION: 1. Advanced degenerative changes from the skullbase to C2 appear related to chronic atlanto-occipital AND atlanto-axial instability. There is associated anterior and left lateral subluxation of C1 on C2 with up to severe joint space loss, and significant degenerative osseous remodeling about the anterior C1-odontoid articulation. 2. 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 3. Superimposed more conventional cervical spine degeneration, including mild spondylolisthesis, from C2-C3 to C4-C5, with up to mild additional degenerative spinal stenosis. Electronically Signed   By: Genevie Ann M.D.   On: 06/21/2017 16:33   Ct Knee Right Wo Contrast  Result Date: 06/20/2017 CLINICAL DATA:  Fractures of the proximal right tibia and fibula. EXAM: CT OF THE RIGHT KNEE WITHOUT CONTRAST TECHNIQUE: Multidetector CT imaging of the right knee was performed according to the standard protocol. Multiplanar CT image reconstructions were also generated. COMPARISON:  Radiographs dated 06/20/2017 FINDINGS: Bones/Joint/Cartilage There is an impacted comminuted fracture of the proximal right tibia. The majority of the medial tibial plateau is intact. The fracture involves only the posterolateral aspect of the medial tibial plateau. However, the lateral tibial plateau is comminuted. There is approximately 13 mm of impaction of proximal tibial shaft into the proximal tibia. The distal femur is intact. There is a comminuted slightly impacted fracture of the head of the fibula. Hemarthrosis. IMPRESSION: Comminuted impacted fracture of the proximal tibia as described. Comminuted fracture of the fibular head. Electronically Signed   By: Lorriane Shire M.D.   On: 06/20/2017 17:04   Dg Pelvis Portable  Result Date:  06/20/2017 CLINICAL DATA:  Pt from group home. Staff report pt had a fall yesterday afternoon. Went to [facility] and was diagnosed with a R tib/fib fracture. H/o arthritis and down's syndrome. Best obtainable, pt in pain. EXAM: PORTABLE PELVIS 1-2 VIEWS COMPARISON:  None. FINDINGS: Insert located. No evidence of femoral neck fracture on single view. Pannus does overlie the femoral necks somewhat  limiting sensitivity. No pelvic fracture or sacral fracture. IMPRESSION: 1. No evidence of pelvic fracture. 2. No evidence of femoral neck fracture on suboptimal exam Electronically Signed   By: Suzy Bouchard M.D.   On: 06/20/2017 14:45   Dg Knee Right Port  Result Date: 06/22/2017 CLINICAL DATA:  Tibial plateau fracture post ORIF. EXAM: PORTABLE RIGHT KNEE - 1-2 VIEW COMPARISON:  CT 06/20/2017. Intraoperative radiographs earlier today. FINDINGS: AP and lateral views obtained portably demonstrate lateral tibial plate and screw fixation of the comminuted proximal tibial fracture. There is near anatomic reduction of the main fracture fragments. Fracture of the fibular head and neck is minimally displaced. The distal femur and patella are intact. There is a small joint effusion with mild soft tissue emphysema. IMPRESSION: Near anatomic reduction of comminuted fracture of the proximal tibia post ORIF. No demonstrated complication. Electronically Signed   By: Richardean Sale M.D.   On: 06/22/2017 15:57   Dg C-arm 61-120 Min  Result Date: 06/22/2017 CLINICAL DATA:  36 year old female with fracture of the right tibia and fibula. Subsequent encounter. EXAM: RIGHT KNEE - 3 VIEW; DG C-ARM 61-120 MIN COMPARISON:  06/20/2017 FINDINGS: Eight intraoperative C-arm views submitted for review after procedure. Sideplate and screws utilized to transfix comminuted right tibial plateau fracture. Proximal right fibular fracture noted. IMPRESSION: Open reduction and internal fixation of right tibial plateau fracture. Electronically  Signed   By: Genia Del M.D.   On: 06/22/2017 17:37   Dg Knee 2 Views Right  Result Date: 06/22/2017 CLINICAL DATA:  36 year old female with fracture of the right tibia and fibula. Subsequent encounter. EXAM: RIGHT KNEE - 3 VIEW; DG C-ARM 61-120 MIN COMPARISON:  06/20/2017 FINDINGS: Eight intraoperative C-arm views submitted for review after procedure. Sideplate and screws utilized to transfix comminuted right tibial plateau fracture. Proximal right fibular fracture noted. IMPRESSION: Open reduction and internal fixation of right tibial plateau fracture. Electronically Signed   By: Genia Del M.D.   On: 06/22/2017 17:37     Assessment/Plan   ICD-10-CM   1. S/P ORIF (open reduction internal fixation) fracture Z96.7    Z87.81     06/22/17 for right bicondylar tibial plateau fx  2. Chronic pain of left knee M25.562    G89.29   3. Rheumatoid arthritis involving multiple sites, unspecified rheumatoid factor presence (HCC) M06.9   4. Slow transit constipation K59.01   5. Moderate asthma without complication, unspecified whether persistent J45.909   6. Hypokalemia E87.6   7. Gastroesophageal reflux disease without esophagitis K21.9   8. 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 Communicated with pt and nursing.  Will follow  Jahir Halt S. Perlie Gold  [facility] [address] [place], [place] [address] [phone] Cell (Monday-Friday 8 AM - 5 PM) [phone] After 5 PM and follow prompts

## 2017-07-04 DIAGNOSIS — G8929 Other chronic pain: Secondary | ICD-10-CM | POA: Insufficient documentation

## 2017-07-04 DIAGNOSIS — M25562 Pain in left knee: Secondary | ICD-10-CM

## 2017-07-29 ENCOUNTER — Encounter: Payer: Self-pay | Admitting: Internal Medicine

## 2017-07-29 ENCOUNTER — Non-Acute Institutional Stay (SKILLED_NURSING_FACILITY): Payer: Medicaid Other | Admitting: Internal Medicine

## 2017-07-29 DIAGNOSIS — Z8781 Personal history of (healed) traumatic fracture: Secondary | ICD-10-CM

## 2017-07-29 DIAGNOSIS — Z9889 Other specified postprocedural states: Secondary | ICD-10-CM

## 2017-07-29 DIAGNOSIS — J45909 Unspecified asthma, uncomplicated: Secondary | ICD-10-CM | POA: Diagnosis not present

## 2017-07-29 DIAGNOSIS — R7989 Other specified abnormal findings of blood chemistry: Secondary | ICD-10-CM

## 2017-07-29 DIAGNOSIS — Z79899 Other long term (current) drug therapy: Secondary | ICD-10-CM

## 2017-07-29 DIAGNOSIS — R946 Abnormal results of thyroid function studies: Secondary | ICD-10-CM | POA: Diagnosis not present

## 2017-07-29 DIAGNOSIS — M069 Rheumatoid arthritis, unspecified: Secondary | ICD-10-CM

## 2017-07-29 DIAGNOSIS — Z967 Presence of other bone and tendon implants: Secondary | ICD-10-CM

## 2017-07-29 NOTE — Progress Notes (Signed)
DATE:  July 29, 2017  Location:   [facility] Room Number: 117 A Place of Service: SNF (31)   Extended Emergency Contact Information Primary Emergency Contact: Cudworth,Patricia Address: [address]          [place] [address] [place] Mobile Phone: [phone] Relation: Mother Secondary Emergency Contact: Lilli Light [place] Mobile Phone: [phone] Relation: Other  Advanced Directive information Does Patient Have a Medical Advance Directive?: No, Would patient like information on creating a medical advance directive?: No - Patient declined  Chief Complaint  Patient presents with  . Medical Management of Chronic Issues    1 month follow up    HPI:  36 yo short term rehab female seen today for f/u. She did not get to attend camp this year but look forward to next year's camp. Pain is well controlled. She did pull muscle in left lower back yesterday while turning in bed. Feels better since taking muscle relaxer earlier today. No falls. She is scheduled to see ortho Dr Marcelino Scot next week. She is weightbearing with walker but not really walking well. Appetite ok and sleeps well. She is a poor historian due to intellectual disability. Hx obtained from chart.  S/p right ORIF of bicondylar tibial plateau - 06/22/17 by Dr Marcelino Scot. Tolerating PT/OT. She has requested walker with seat when she is d/c'd back to group home  RA - stable on folic acid 2 mg daily; methotrexate 7.5 mg in AM and 10 mg PM weekly; relafen 500 mg twice daily   GERD - stable on nexium 40 mg daily   Hypokalemia - stable on KCl 10 meq BID. K 4.4   Constipation - stable on colace twice daily and metamucil daily   Asthma - stable on beclomethasone 1 puff twice daily; singular 10 mg daily; albuterol 2 puffs every 4 hours as needed. No recent exacerbations  Women's health - stable on vienva 0.1-20 mg/mcg daily   Past Medical History:  Diagnosis Date  . 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 Closed bicondylar fracture of right tibial plateau 06/20/2017  . 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 Down's syndrome   . GERD (gastroesophageal reflux disease)   . Rheumatoid arthritis ([place])   . Strabismus     Past Surgical History:  Procedure Laterality Date  . CARDIAC SURGERY    . FOOT ARTHRODESIS, TRIPLE    . ORIF TIBIA PLATEAU Right 06/22/2017   Procedure: OPEN REDUCTION INTERNAL FIXATION (ORIF) TIBIAL PLATEAU;  Surgeon: Altamese [place], MD;  Location: [facility];  Service: Orthopedics;  Laterality: Right;    Patient Care Team: Carol Ada, MD as PCP - General (Family Medicine)  Social History   Social History  . Marital status: Single    Spouse name: N/A  . Number of children: N/A  . Years of education: N/A   Occupational History  . Not on file.   Social History Main Topics  . Smoking status: Never Smoker  . Smokeless tobacco: Never Used  . Alcohol use No  . Drug use: No  . Sexual activity: Not on file   Other Topics Concern  . Not on file   Social History Narrative  . No narrative on file     reports that she has never smoked. She has never used smokeless tobacco. She reports that she does not drink alcohol or use drugs.  History reviewed. No pertinent family history. No family status information on file.  Immunization History  Administered Date(s) Administered  . PPD Test 06/26/2017    Allergies  Allergen Reactions  . Ceclor [Cefaclor] Hives  . Penicillins Hives     Has patient had a PCN reaction causing immediate rash, facial/tongue/throat swelling, SOB or lightheadedness with hypotension: Unknown Has patient had a PCN reaction causing severe rash involving mucus membranes or skin necrosis: Unknown Has patient had a PCN reaction that required hospitalization: Unknown Has patient had a PCN reaction occurring within the last 10 years: Unknown If all of the above answers are "NO", then may  proceed with Cephalosporin use.  . Sulfa Antibiotics Hives and Other (See Comments)    FEVER    Medications: Patient's Medications  New Prescriptions   No medications on file  Previous Medications   ACETAMINOPHEN (TYLENOL) 500 MG TABLET    Take 1-2 tablets (500-1,000 mg total) by mouth every 6 (six) hours as needed for mild pain or moderate pain.   ALBUTEROL (PROVENTIL HFA;VENTOLIN HFA) 108 (90 BASE) MCG/ACT INHALER    Inhale 2 puffs into the lungs every 4 (four) hours as needed for wheezing or shortness of breath.   BECLOMETHASONE (QVAR) 40 MCG/ACT INHALER    Inhale 1 puff into the lungs 2 (two) times daily.   DOCUSATE SODIUM (COLACE) 100 MG CAPSULE    Take 1 capsule (100 mg total) by mouth 2 (two) times daily.   ENOXAPARIN (LOVENOX) 40 MG/0.4ML INJECTION    Inject 0.4 mLs (40 mg total) into the skin daily.   ESOMEPRAZOLE (NEXIUM) 40 MG CAPSULE    Take 40 mg by mouth daily at 12 noon.   EYELID CLEANSERS (OCUSOFT EYELID CLEANSING) PADS    Apply to Eyelid and eyelash topically two times a day for Dry eyes   FOLIC ACID (FOLVITE) 1 MG TABLET    Take 2 mg by mouth daily.   KETOTIFEN (ZADITOR) 0.025 % OPHTHALMIC SOLUTION    Place 1 drop into both eyes 2 (two) times daily.   LEVONORGESTREL-ETHINYL ESTRADIOL (VIENVA) 0.1-20 MG-MCG TABLET    Take 1 tablet by mouth daily.   METHOCARBAMOL (ROBAXIN) 500 MG TABLET    Take 1-2 tablets (500-1,000 mg total) by mouth every 6 (six) hours as needed for muscle spasms.   METHOTREXATE (RHEUMATREX) 10 MG TABLET    Take 7.5-10 mg by mouth once a week. On 7.5 mg on Thursday mornings and 10 mg on Thursday evenings   MICONAZOLE (ZEASORB-AF) 2 % POWDER    Apply 1 application topically 2 (two) times daily as needed for itching (under stomach).   MONTELUKAST (SINGULAIR) 10 MG TABLET    Take 10 mg by mouth every evening.   MULTIPLE VITAMIN (MULTIVITAMIN WITH MINERALS) TABS TABLET    Take 1 tablet by mouth daily.   MUPIROCIN OINTMENT (BACTROBAN) 2 %    Apply 1 application  topically daily.   NABUMETONE (RELAFEN) 500 MG TABLET    Take 500 mg by mouth 2 (two) times daily.   OXYCODONE (OXY IR/ROXICODONE) 5 MG IMMEDIATE RELEASE TABLET    Take 1 tablet (5 mg total) by mouth every 6 (six) hours as needed for severe pain or breakthrough pain.   POTASSIUM CHLORIDE (MICRO-K) 10 MEQ CR CAPSULE    Take 10 mEq by mouth 2 (two) times daily.   PSYLLIUM (METAMUCIL) 58.6 % PACKET    Take 1 packet by mouth daily.   SODIUM CHLORIDE (OCEAN FOR KIDS) 0.65 % NASAL SPRAY    Place 1 spray into the nose 2 (two) times  daily.    TRIAMCINOLONE OINTMENT (KENALOG) 0.1 %    Apply 1 application topically 2 (two) times daily as needed (rash).  Modified Medications   No medications on file  Discontinued Medications   No medications on file    Review of Systems  Unable to perform ROS: Psychiatric disorder (intellectual disability)    Vitals:   07/29/17 1317  BP: 116/78  Pulse: 83  Resp: 18  Temp: 97.7 F (36.5 C)  SpO2: 93%  Weight: 199 lb 12.8 oz (90.6 kg)  Height: _0  (1.372 m)   Body mass index is 48.17 kg/m.  Physical Exam  Constitutional: She appears well-developed.  Frail appearing, sitting up in bed in NAD  HENT:  Mouth/Throat: Oropharynx is clear and moist. No oropharyngeal exudate.  MMM; no oral thrush  Eyes: Pupils are equal, round, and reactive to light. No scleral icterus.  Neck: Neck supple. Carotid bruit is not present. No tracheal deviation present. No thyromegaly present.  Cardiovascular: Normal rate, regular rhythm and intact distal pulses.  Exam reveals no gallop and no friction rub.   Murmur (1/6 SEM) heard. No LE edema b/l. no calf TTP.   Pulmonary/Chest: Effort normal and breath sounds normal. No stridor. No respiratory distress. She has no wheezes. She has no rales.  Abdominal: Soft. Normal appearance and bowel sounds are normal. She exhibits no distension and no mass. There is no hepatomegaly. There is no tenderness. There is no rigidity, no rebound  and no guarding. No hernia.  Musculoskeletal: She exhibits edema. She exhibits no tenderness.  Right knee surgical scars present with no redness, signs of dehiscence  Lymphadenopathy:    She has no cervical adenopathy.  Neurological: She is alert.  Skin: Skin is warm and dry. Rash (b/l foot with dermatitis but no secondary signs of infection) noted.  Psychiatric: She has a normal mood and affect. Her behavior is normal. Thought content normal.     Labs reviewed: Admission on 06/20/2017, Discharged on 06/25/2017  Component Date Value Ref Range Status  . Color, Urine 06/20/2017 YELLOW  YELLOW Final  . APPearance 06/20/2017 CLEAR  CLEAR Final  . Specific Gravity, Urine 06/20/2017 1.018  1.005 - 1.030 Final  . pH 06/20/2017 5.0  5.0 - 8.0 Final  . Glucose, UA 06/20/2017 NEGATIVE  NEGATIVE mg/dL Final  . Hgb urine dipstick 06/20/2017 MODERATE* NEGATIVE Final  . Bilirubin Urine 06/20/2017 NEGATIVE  NEGATIVE Final  . Ketones, ur 06/20/2017 NEGATIVE  NEGATIVE mg/dL Final  . Protein, ur 06/20/2017 NEGATIVE  NEGATIVE mg/dL Final  . Nitrite 06/20/2017 NEGATIVE  NEGATIVE Final  . Leukocytes, UA 06/20/2017 NEGATIVE  NEGATIVE Final  . RBC / HPF 06/20/2017 0-5  0 - 5 RBC/hpf Final  . WBC, UA 06/20/2017 0-5  0 - 5 WBC/hpf Final  . Bacteria, UA 06/20/2017 NONE SEEN  NONE SEEN Final  . Squamous Epithelial / LPF 06/20/2017 0-5* NONE SEEN Final  . Mucous 06/20/2017 PRESENT   Final  . Preg Test, Ur 06/20/2017 NEGATIVE  NEGATIVE Final   Comment:        THE SENSITIVITY OF THIS METHODOLOGY IS >20 mIU/mL.   . WBC 06/20/2017 9.2  4.0 - 10.5 K/uL Final  . RBC 06/20/2017 3.30* 3.87 - 5.11 MIL/uL Final  . Hemoglobin 06/20/2017 12.6  12.0 - 15.0 g/dL Final  . HCT 06/20/2017 36.6  36.0 - 46.0 % Final  . MCV 06/20/2017 110.9* 78.0 - 100.0 fL Final  . MCH 06/20/2017 38.2* 26.0 - 34.0 pg Final  .  MCHC 06/20/2017 34.4  30.0 - 36.0 g/dL Final  . RDW 06/20/2017 14.0  11.5 - 15.5 % Final  . Platelets 06/20/2017  307  150 - 400 K/uL Final  . Neutrophils Relative % 06/20/2017 79  % Final  . Neutro Abs 06/20/2017 7.2  1.7 - 7.7 K/uL Final  . Lymphocytes Relative 06/20/2017 19  % Final  . Lymphs Abs 06/20/2017 1.7  0.7 - 4.0 K/uL Final  . Monocytes Relative 06/20/2017 3  % Final  . Monocytes Absolute 06/20/2017 0.2  0.1 - 1.0 K/uL Final  . Eosinophils Relative 06/20/2017 0  % Final  . Eosinophils Absolute 06/20/2017 0.0  0.0 - 0.7 K/uL Final  . Basophils Relative 06/20/2017 0  % Final  . Basophils Absolute 06/20/2017 0.0  0.0 - 0.1 K/uL Final  . Sodium 06/20/2017 143  135 - 145 mmol/L Final  . Potassium 06/20/2017 3.7  3.5 - 5.1 mmol/L Final  . Chloride 06/20/2017 111  101 - 111 mmol/L Final  . CO2 06/20/2017 22  22 - 32 mmol/L Final  . Glucose, Bld 06/20/2017 120* 65 - 99 mg/dL Final  . BUN 06/20/2017 14  6 - 20 mg/dL Final  . Creatinine, Ser 06/20/2017 0.64  0.44 - 1.00 mg/dL Final  . Calcium 06/20/2017 8.7* 8.9 - 10.3 mg/dL Final  . Total Protein 06/20/2017 7.2  6.5 - 8.1 g/dL Final  . Albumin 06/20/2017 3.5  3.5 - 5.0 g/dL Final  . AST 06/20/2017 36  15 - 41 U/L Final  . ALT 06/20/2017 29  14 - 54 U/L Final  . Alkaline Phosphatase 06/20/2017 55  38 - 126 U/L Final  . Total Bilirubin 06/20/2017 0.8  0.3 - 1.2 mg/dL Final  . GFR calc non Af Amer 06/20/2017 >60  >60 mL/min Final  . GFR calc Af Amer 06/20/2017 >60  >60 mL/min Final   Comment: (NOTE) The eGFR has been calculated using the CKD EPI equation. This calculation has not been validated in all clinical situations. eGFR's persistently <60 mL/min signify possible Chronic Kidney Disease.   . Anion gap 06/20/2017 10  5 - 15 Final  . ABO/RH(D) 06/20/2017 O POS   Final  . Antibody Screen 06/20/2017 NEG   Final  . Sample Expiration 06/20/2017 06/23/2017   Final  . ABO/RH(D) 06/20/2017 O POS   Final  . HIV Screen 4th Generation wRfx 06/20/2017 Non Reactive  Non Reactive Final   Comment: (NOTE) Performed At: [facility] [place], [place] [id number] Lindon Romp MD WU:[id number]   . WBC 06/22/2017 7.1  4.0 - 10.5 K/uL Final  . RBC 06/22/2017 2.67* 3.87 - 5.11 MIL/uL Final  . Hemoglobin 06/22/2017 10.0* 12.0 - 15.0 g/dL Final  . HCT 06/22/2017 30.3* 36.0 - 46.0 % Final  . MCV 06/22/2017 113.5* 78.0 - 100.0 fL Final  . MCH 06/22/2017 37.5* 26.0 - 34.0 pg Final  . MCHC 06/22/2017 33.0  30.0 - 36.0 g/dL Final  . RDW 06/22/2017 14.0  11.5 - 15.5 % Final  . Platelets 06/22/2017 253  150 - 400 K/uL Final  . Sodium 06/22/2017 141  135 - 145 mmol/L Final  . Potassium 06/22/2017 4.1  3.5 - 5.1 mmol/L Final  . Chloride 06/22/2017 113* 101 - 111 mmol/L Final  . CO2 06/22/2017 23  22 - 32 mmol/L Final  . Glucose, Bld 06/22/2017 91  65 - 99 mg/dL Final  . BUN 06/22/2017 13  6 - 20 mg/dL Final  . Creatinine,  Ser 06/22/2017 0.56  0.44 - 1.00 mg/dL Final  . Calcium 06/22/2017 7.8* 8.9 - 10.3 mg/dL Final  . Total Protein 06/22/2017 5.5* 6.5 - 8.1 g/dL Final  . Albumin 06/22/2017 2.5* 3.5 - 5.0 g/dL Final  . AST 06/22/2017 22  15 - 41 U/L Final  . ALT 06/22/2017 21  14 - 54 U/L Final  . Alkaline Phosphatase 06/22/2017 45  38 - 126 U/L Final  . Total Bilirubin 06/22/2017 0.3  0.3 - 1.2 mg/dL Final  . GFR calc non Af Amer 06/22/2017 >60  >60 mL/min Final  . GFR calc Af Amer 06/22/2017 >60  >60 mL/min Final   Comment: (NOTE) The eGFR has been calculated using the CKD EPI equation. This calculation has not been validated in all clinical situations. eGFR's persistently <60 mL/min signify possible Chronic Kidney Disease.   . Anion gap 06/22/2017 5  5 - 15 Final  . Vit D, 25-Hydroxy 06/22/2017 32.3  30.0 - 100.0 ng/mL Final   Comment: (NOTE) Vitamin D deficiency has been defined by the [place] practice guideline as a level of serum 25-OH vitamin D less than 20 ng/mL (1,2). The Endocrine Society went on to further define vitamin D insufficiency as a level between 21  and 29 ng/mL (2). 1. IOM (Institute of Medicine). 2010. Dietary reference   intakes for calcium and D. [place]: The   [employer]. 2. Holick MF, Binkley [place], Bischoff-Ferrari HA, et al.   Evaluation, treatment, and prevention of vitamin D   deficiency: an Endocrine Society clinical practice   guideline. JCEM. 2011 Jul; 96(7):1911-30. Performed At: [facility] [place], [place] [id number] Lindon Romp MD WU:[id number]   . Vit D, 1,25-Dihydroxy 06/22/2017 59.2  19.9 - 79.3 pg/mL Final   Comment: (NOTE) Performed At: [facility] [place], [place] [id number] Lindon Romp MD VH:[id number]   . TSH 06/22/2017 8.153* 0.350 - 4.500 uIU/mL Final   Performed by a 3rd Generation assay with a functional sensitivity of <=0.01 uIU/mL.  Marland Kitchen PTH 06/22/2017 36  15 - 65 pg/mL Final  . Calcium, Total (PTH) 06/22/2017 7.5* 8.7 - 10.2 mg/dL Final  . PTH Interp 06/22/2017 Comment   Final   Comment: (NOTE) Interpretation                 Intact PTH    Calcium                                (pg/mL)      (mg/dL) Normal                          15 - 65     8.6 - 10.2 Primary Hyperparathyroidism         >65          >10.2 Secondary Hyperparathyroidism       >65          <10.2 Non-Parathyroid Hypercalcemia       <65          >10.2 Hypoparathyroidism                  <15          < 8.6 Non-Parathyroid Hypocalcemia    15 - 65          < 8.6 Performed At: [facility] [place], [place] [id number] Lindon Romp MD CW:[id number]   . Calcium, Ionized, Serum 06/22/2017 4.6  4.5 - 5.6 mg/dL Final   Comment: (NOTE) Performed At: [facility] [place], [place] [id number] Lindon Romp MD [id number]   . Prealbumin 06/22/2017 20.3  18 - 38 mg/dL Final  . Magnesium 06/22/2017 1.9  1.7 - 2.4 mg/dL Final  . Phosphorus 06/22/2017 3.4  2.5 - 4.6 mg/dL Final  . Hgb A1c MFr Bld 06/22/2017 5.0  4.8 -  5.6 % Final   Comment: (NOTE)         Pre-diabetes: 5.7 - 6.4         Diabetes: >6.4         Glycemic control for adults with diabetes: <7.0   . Mean Plasma Glucose 06/22/2017 97  mg/dL Final   Comment: (NOTE) Performed At: [facility] [place], [place] [id number] Lindon Romp MD [id number]   . Sodium 06/23/2017 138  135 - 145 mmol/L Final  . Potassium 06/23/2017 4.6  3.5 - 5.1 mmol/L Final  . Chloride 06/23/2017 109  101 - 111 mmol/L Final  . CO2 06/23/2017 22  22 - 32 mmol/L Final  . Glucose, Bld 06/23/2017 123* 65 - 99 mg/dL Final  . BUN 06/23/2017 6  6 - 20 mg/dL Final  . Creatinine, Ser 06/23/2017 0.58  0.44 - 1.00 mg/dL Final  . Calcium 06/23/2017 7.9* 8.9 - 10.3 mg/dL Final  . GFR calc non Af Amer 06/23/2017 >60  >60 mL/min Final  . GFR calc Af Amer 06/23/2017 >60  >60 mL/min Final   Comment: (NOTE) The eGFR has been calculated using the CKD EPI equation. This calculation has not been validated in all clinical situations. eGFR's persistently <60 mL/min signify possible Chronic Kidney Disease.   . Anion gap 06/23/2017 7  5 - 15 Final  . WBC 06/23/2017 9.6  4.0 - 10.5 K/uL Final  . RBC 06/23/2017 2.55* 3.87 - 5.11 MIL/uL Final  . Hemoglobin 06/23/2017 9.7* 12.0 - 15.0 g/dL Final   CONSISTENT WITH PREVIOUS RESULT  . HCT 06/23/2017 29.2* 36.0 - 46.0 % Final  . MCV 06/23/2017 114.5* 78.0 - 100.0 fL Final  . MCH 06/23/2017 38.0* 26.0 - 34.0 pg Final  . MCHC 06/23/2017 33.2  30.0 - 36.0 g/dL Final  . RDW 06/23/2017 14.3  11.5 - 15.5 % Final  . Platelets 06/23/2017 244  150 - 400 K/uL Final   CONSISTENT WITH PREVIOUS RESULT  . Free T4 06/22/2017 0.88  0.61 - 1.12 ng/dL Final   Comment: (NOTE) Biotin ingestion may interfere with free T4 tests. If the results are inconsistent with the TSH level, previous test results, or the clinical presentation, then consider biotin interference. If needed, order repeat testing after stopping biotin.   .  Sodium 06/24/2017 138  135 - 145 mmol/L Final  . Potassium 06/24/2017 4.4  3.5 - 5.1 mmol/L Final  . Chloride 06/24/2017 107  101 - 111 mmol/L Final  . CO2 06/24/2017 26  22 - 32 mmol/L Final  . Glucose, Bld 06/24/2017 99  65 - 99 mg/dL Final  . BUN 06/24/2017 10  6 - 20 mg/dL Final  . Creatinine, Ser 06/24/2017 0.59  0.44 - 1.00 mg/dL Final  . Calcium 06/24/2017 8.0* 8.9 - 10.3 mg/dL Final  . GFR calc non Af Amer 06/24/2017 >60  >60 mL/min Final  . GFR calc Af Amer 06/24/2017 >60  >60 mL/min  Final   Comment: (NOTE) The eGFR has been calculated using the CKD EPI equation. This calculation has not been validated in all clinical situations. eGFR's persistently <60 mL/min signify possible Chronic Kidney Disease.   . Anion gap 06/24/2017 5  5 - 15 Final  . T3, Free 06/24/2017 2.2  2.0 - 4.4 pg/mL Final   Comment: (NOTE) Performed At: [facility] [place], [place] [id number] Lindon Romp MD [id number]   . WBC 06/24/2017 9.0  4.0 - 10.5 K/uL Final  . RBC 06/24/2017 2.51* 3.87 - 5.11 MIL/uL Final  . Hemoglobin 06/24/2017 9.4* 12.0 - 15.0 g/dL Final  . HCT 06/24/2017 28.8* 36.0 - 46.0 % Final  . MCV 06/24/2017 114.7* 78.0 - 100.0 fL Final  . MCH 06/24/2017 37.5* 26.0 - 34.0 pg Final  . MCHC 06/24/2017 32.6  30.0 - 36.0 g/dL Final  . RDW 06/24/2017 14.7  11.5 - 15.5 % Final  . Platelets 06/24/2017 255  150 - 400 K/uL Final    No results found.   Assessment/Plan   ICD-10-CM   1. High risk medication use Z79.899   2. S/P ORIF (open reduction internal fixation) fracture Z96.7    Z87.81   3. Rheumatoid arthritis involving multiple sites, unspecified rheumatoid factor presence (HCC) M06.9   4. Moderate asthma without complication, unspecified whether persistent J45.909   5. Elevated TSH R94.6      Check CMP and CBC w diff due to high risk med use; TSH 2/2 elevated TSH  F/u with Ortho as scheduled  PT/OT as ordered  Cont current meds as  ordered  Will follow  Gavan Nordby S. Perlie Gold  [facility] and Adult Medicine [address] [place], [place] [address] [phone] Cell (Monday-Friday 8 AM - 5 PM) [phone] After 5 PM and follow prompts

## 2017-07-30 ENCOUNTER — Encounter: Payer: Self-pay | Admitting: Adult Health

## 2017-07-30 ENCOUNTER — Non-Acute Institutional Stay (SKILLED_NURSING_FACILITY): Payer: Medicaid Other | Admitting: Adult Health

## 2017-07-30 DIAGNOSIS — E039 Hypothyroidism, unspecified: Secondary | ICD-10-CM | POA: Diagnosis not present

## 2017-07-30 LAB — TSH: TSH: 7.92 — AB (ref 0.41–5.90)

## 2017-07-30 LAB — HEPATIC FUNCTION PANEL
ALT: 17 (ref 7–35)
AST: 21 (ref 13–35)
Alkaline Phosphatase: 115 (ref 25–125)
Bilirubin, Total: 0.2

## 2017-07-30 LAB — CBC AND DIFFERENTIAL
HCT: 38 (ref 36–46)
Hemoglobin: 12.3 (ref 12.0–16.0)
Neutrophils Absolute: 4
Platelets: 403 — AB (ref 150–399)
WBC: 5.6

## 2017-07-30 LAB — BASIC METABOLIC PANEL
BUN: 18 (ref 4–21)
Creatinine: 0.6 (ref 0.5–1.1)
Glucose: 73
Potassium: 4.8 (ref 3.4–5.3)
Sodium: 139 (ref 137–147)

## 2017-07-30 NOTE — Progress Notes (Signed)
Location:   [facility] Room Number: 117 A Place of Service:  SNF (31)   CODE STATUS: full code  Allergies  Allergen Reactions  . Ceclor [Cefaclor] Hives  . Penicillins Hives     Has patient had a PCN reaction causing immediate rash, facial/tongue/throat swelling, SOB or lightheadedness with hypotension: Unknown Has patient had a PCN reaction causing severe rash involving mucus membranes or skin necrosis: Unknown Has patient had a PCN reaction that required hospitalization: Unknown Has patient had a PCN reaction occurring within the last 10 years: Unknown If all of the above answers are "NO", then may proceed with Cephalosporin use.  . Sulfa Antibiotics Hives and Other (See Comments)    FEVER    Chief Complaint  Patient presents with  . Acute Visit    Follow up Thyroid lab results    HPI:  Today her lab result have returned demonstrating hypothyroidism. She has no history of thyroid disease. We have spoken at length regarding her lab results; the treatment options. Her thyroid gland feels grainy and she will benefit from a thyroid ultrasound. She denies any fatigue; no excessive weight. She denies any body aches except for her fractured leg. She does have dry skin.    Past Medical History:  Diagnosis Date  . 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 Closed bicondylar fracture of right tibial plateau 06/20/2017  . 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 Down's syndrome   . GERD (gastroesophageal reflux disease)   . Rheumatoid arthritis (HCC)   . Strabismus     Past Surgical History:  Procedure Laterality Date  . CARDIAC SURGERY    . FOOT ARTHRODESIS, TRIPLE    . ORIF TIBIA PLATEAU Right 06/22/2017   Procedure: OPEN REDUCTION INTERNAL FIXATION (ORIF) TIBIAL PLATEAU;  Surgeon: Myrene Galas, MD;  Location: [facility] OR;  Service: Orthopedics;  Laterality: Right;    Social History   Social History  . Marital status: Single    Spouse  name: N/A  . Number of children: N/A  . Years of education: N/A   Occupational History  . Not on file.   Social History Main Topics  . Smoking status: Never Smoker  . Smokeless tobacco: Never Used  . Alcohol use No  . Drug use: No  . Sexual activity: Not on file   Other Topics Concern  . Not on file   Social History Narrative  . No narrative on file   History reviewed. No pertinent family history.    VITAL SIGNS BP 116/78   Pulse 83   Temp 97.7 F (36.5 C)   Resp 18   Ht 4\' 6"  (1.372 m)   Wt 203 lb 3.2 oz (92.2 kg)   SpO2 93%   BMI 48.99 kg/m   Patient's Medications  New Prescriptions   No medications on file  Previous Medications   ACETAMINOPHEN (TYLENOL) 500 MG TABLET    Take 1-2 tablets (500-1,000 mg total) by mouth every 6 (six) hours as needed for mild pain or moderate pain.   ALBUTEROL (PROVENTIL HFA;VENTOLIN HFA) 108 (90 BASE) MCG/ACT INHALER    Inhale 2 puffs into the lungs every 4 (four) hours as needed for wheezing or shortness of breath.   BECLOMETHASONE (QVAR) 40 MCG/ACT INHALER    Inhale 1 puff into the lungs 2 (two) times daily.   DOCUSATE SODIUM (COLACE) 100 MG CAPSULE    Take 1 capsule (100 mg total) by mouth 2 (two) times daily.  ENOXAPARIN (LOVENOX) 40 MG/0.4ML INJECTION    Inject 0.4 mLs (40 mg total) into the skin daily.   ESOMEPRAZOLE (NEXIUM) 40 MG PACKET    Take 40 mg by mouth daily at 12 noon.   EYELID CLEANSERS (OCUSOFT EYELID CLEANSING) PADS    Apply to Eyelid and eyelash topically two times a day for Dry eyes   FOLIC ACID (FOLVITE) 1 MG TABLET    Take 2 mg by mouth daily.   KETOTIFEN (ZADITOR) 0.025 % OPHTHALMIC SOLUTION    Place 1 drop into both eyes 2 (two) times daily.   LEVONORGESTREL-ETHINYL ESTRADIOL (VIENVA) 0.1-20 MG-MCG TABLET    Take 1 tablet by mouth daily.   METHOCARBAMOL (ROBAXIN) 500 MG TABLET    Take 1-2 tablets (500-1,000 mg total) by mouth every 6 (six) hours as needed for muscle spasms.   METHOTREXATE (RHEUMATREX) 10 MG  TABLET    Take 7.5-10 mg by mouth once a week. On 7.5 mg on Thursday mornings and 10 mg on Thursday evenings   MICONAZOLE (ZEASORB-AF) 2 % POWDER    Apply 1 application topically 2 (two) times daily as needed for itching (under stomach).   MONTELUKAST (SINGULAIR) 10 MG TABLET    Take 10 mg by mouth every evening.   MULTIPLE VITAMIN (MULTIVITAMIN WITH MINERALS) TABS TABLET    Take 1 tablet by mouth daily.   MUPIROCIN OINTMENT (BACTROBAN) 2 %    Apply 1 application topically daily.   NABUMETONE (RELAFEN) 500 MG TABLET    Take 500 mg by mouth 2 (two) times daily.   OXYCODONE (OXY IR/ROXICODONE) 5 MG IMMEDIATE RELEASE TABLET    Take 1 tablet (5 mg total) by mouth every 6 (six) hours as needed for severe pain or breakthrough pain.   POTASSIUM CHLORIDE (MICRO-K) 10 MEQ CR CAPSULE    Take 10 mEq by mouth 2 (two) times daily.   PSYLLIUM (METAMUCIL) 58.6 % PACKET    Take 1 packet by mouth daily.   SODIUM CHLORIDE (OCEAN FOR KIDS) 0.65 % NASAL SPRAY    Place 1 spray into the nose 2 (two) times daily.    TRIAMCINOLONE OINTMENT (KENALOG) 0.1 %    Apply 1 application topically 2 (two) times daily as needed (rash).  Modified Medications   No medications on file  Discontinued Medications   No medications on file     SIGNIFICANT DIAGNOSTIC EXAMS  PREVIOUS   06-20-17: pelvic x-ray: 1. No evidence of pelvic fracture. 2. No evidence of femoral neck fracture on suboptimal exam  06-20-17: ct of right knee: Comminuted impacted fracture of the proximal tibia as described. Comminuted fracture of the fibular head.    06-20-17: cervical spine x-ray: 1. There is subluxation between C1 and C2, which may be ligamentous laxity or potentially from a type 2 odontoid fracture. The odontoid is not well-defined radiographically.  06-21-17: ct of cervical spine: 1. Advanced degenerative changes from the skullbase to C2 appear related to chronic atlanto-occipital AND atlanto-axial instability. There is associated anterior and  left lateral subluxation of C1 on C2 with up to severe joint space loss, and significant degenerative osseous remodeling about the anterior C1-odontoid articulation. 2. 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 3. Superimposed more conventional cervical spine degeneration, including mild spondylolisthesis, from C2-C3 to C4-C5, with up to mild additional degenerative spinal stenosis.  06-22-17: right knee x-ray Open reduction and internal fixation of right tibial plateau fracture   LABS REVIEWED: PREVIOUS    06-20-17: wbc 9.2; hgb 12.6; hct 36.6;  mcv 110.9; plt 307; glucose 120; bun 14; creat 0.64 ;k+ 3.7; na++ 143; ca 8.7; liver normal albumin 3.5 06-22-17: wbc 7.1; hgb 10.0; hct 30.3; mcv 113.5; plt 253; glucose 91' bun 31; creat 0.56; k+ 4.1; na++ 141; ca 7.8; liver normal albumin 2.5; mag 1.9; phos 3.4 pre-albumin 20.3; vit D 32.3; calcittriol 59.2; ionized ca 4.6; pth 36; tsh  4.153; free T 4: 0.88 hgb a1c 5.0 06-24-17: wbc 9.0; hgb 9.4; hct 28.8 ;mcv 114.7; plt 255; glucose 99; bun 10; create 0.59; k+ 4.4 ;na++ 138; ca 8.0 free T 3: 2.3   TODAY:  07-30-17: wbc 5.6; hgb 12.3; hct 37.5; mcv 114.2; plt 403; glucose 73; bun 17.8; create 0.58; k+ 4.8; na++ 139; ca 8.5; liver normal albumin 3.6; TSH 7.92    Review of Systems  Constitutional: Negative for malaise/fatigue.  Respiratory: Negative for cough and shortness of breath.   Cardiovascular: Negative for chest pain, palpitations and leg swelling.  Gastrointestinal: Negative for abdominal pain, constipation and heartburn.  Musculoskeletal: Negative for back pain, joint pain and myalgias.       Her right leg pain is being managed   Skin: Negative.        Skin is dry   Neurological: Negative for dizziness.  Psychiatric/Behavioral: The patient is not nervous/anxious.     Physical Exam  Constitutional: She is oriented to person, place, and time. No distress.  Obese   Eyes: Conjunctivae are normal.  Neck:  Neck supple. No JVD present. No thyromegaly present.  Thyroid gland is grainy to palpable non tender   Cardiovascular: Normal rate, regular rhythm and intact distal pulses.   Respiratory: Effort normal and breath sounds normal. No respiratory distress. She has no wheezes.  GI: Soft. Bowel sounds are normal. She exhibits no distension. There is no tenderness.  Musculoskeletal: She exhibits no edema.  Able to move all extremities  Has right lower extremity fracture   Lymphadenopathy:    She has no cervical adenopathy.  Neurological: She is alert and oriented to person, place, and time.  Skin: Skin is warm and dry. She is not diaphoretic.  Psychiatric: She has a normal mood and affect.    ASSESSMENT/ PLAN:  1. Hypothyroidism: is new problem: tsh 7.92 will begin synthroid 25 mcg daily will set up a thyroid ultrasound; will check tsh free t3; free t4 in 6 weeks.     MD is aware of resident's narcotic use and is in agreement with current plan of care. We will attempt to wean resident as apropriate   Synthia Innocent NP [facility]  Contact [phone] Monday through Friday 8am- 5pm  After hours call [phone]

## 2017-08-02 ENCOUNTER — Non-Acute Institutional Stay (SKILLED_NURSING_FACILITY): Payer: Medicaid Other | Admitting: Adult Health

## 2017-08-02 ENCOUNTER — Encounter: Payer: Self-pay | Admitting: Adult Health

## 2017-08-02 DIAGNOSIS — E039 Hypothyroidism, unspecified: Secondary | ICD-10-CM | POA: Diagnosis not present

## 2017-08-02 DIAGNOSIS — E041 Nontoxic single thyroid nodule: Secondary | ICD-10-CM | POA: Diagnosis not present

## 2017-08-02 NOTE — Progress Notes (Signed)
Location:   [place] Room Number: 117 A Place of Service:  SNF (31)   CODE STATUS: Full Code  Allergies  Allergen Reactions  . Ceclor [Cefaclor] Hives  . Penicillins Hives     Has patient had a PCN reaction causing immediate rash, facial/tongue/throat swelling, SOB or lightheadedness with hypotension: Unknown Has patient had a PCN reaction causing severe rash involving mucus membranes or skin necrosis: Unknown Has patient had a PCN reaction that required hospitalization: Unknown Has patient had a PCN reaction occurring within the last 10 years: Unknown If all of the above answers are "NO", then may proceed with Cephalosporin use.  . Sulfa Antibiotics Hives and Other (See Comments)    FEVER    Chief Complaint  Patient presents with  . Acute Visit    Follow up Thyroid Ultrasound    HPI:  She has been started on synthroid 25 mcg daily for a tsh of 7.92. 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 I have met with her mother; Clarise Cruz and the supervisor. Clarise Cruz has been informed that her ultrasound shows nodules; her mother has decided not to mention cancer; as this will cause Clarise Cruz undo stress and anxiety. Clarise Cruz understands that she will need to be seen by an endocrinologist. She has been told that she will need a biopsy performed. Her weight has remained stable.     Past Medical History:  Diagnosis Date  . 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 Closed bicondylar fracture of right tibial plateau 06/20/2017  . 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 Down's syndrome   . GERD (gastroesophageal reflux disease)   . Rheumatoid arthritis ([place])   . Strabismus     Past Surgical History:  Procedure Laterality Date  . CARDIAC SURGERY    . FOOT ARTHRODESIS, TRIPLE    . ORIF TIBIA PLATEAU Right 06/22/2017   Procedure: OPEN REDUCTION INTERNAL  FIXATION (ORIF) TIBIAL PLATEAU;  Surgeon: Altamese Morristown, MD;  Location: [facility];  Service: Orthopedics;  Laterality: Right;    Social History   Social History  . Marital status: Single    Spouse name: N/A  . Number of children: N/A  . Years of education: N/A   Occupational History  . Not on file.   Social History Main Topics  . Smoking status: Never Smoker  . Smokeless tobacco: Never Used  . Alcohol use No  . Drug use: No  . Sexual activity: Not on file   Other Topics Concern  . Not on file   Social History Narrative  . No narrative on file   History reviewed. No pertinent family history.    VITAL SIGNS BP (!) 144/80   Pulse 80   Temp (!) 96.3 F (35.7 C)   Resp 18   Ht '4\' 6"'  (1.372 m)   Wt 203 lb 3.2 oz (92.2 kg)   SpO2 99%   BMI 48.99 kg/m   Patient's Medications  New Prescriptions   No medications on file  Previous Medications   ACETAMINOPHEN (TYLENOL) 500 MG TABLET    Take 1-2 tablets (500-1,000 mg total) by mouth every 6 (six) hours as needed for mild pain or moderate pain.   ALBUTEROL (PROVENTIL HFA;VENTOLIN HFA) 108 (90 BASE) MCG/ACT INHALER    Inhale 2 puffs into the lungs every 4 (four) hours as needed for wheezing or shortness of breath.   BECLOMETHASONE (QVAR) 40 MCG/ACT  INHALER    Inhale 1 puff into the lungs 2 (two) times daily.   DOCUSATE SODIUM (COLACE) 100 MG CAPSULE    Take 1 capsule (100 mg total) by mouth 2 (two) times daily.   ENOXAPARIN (LOVENOX) 40 MG/0.4ML INJECTION    Inject 0.4 mLs (40 mg total) into the skin daily.   ESOMEPRAZOLE (NEXIUM) 40 MG PACKET    Take 40 mg by mouth daily at 12 noon.   EYELID CLEANSERS (OCUSOFT EYELID CLEANSING) PADS    Apply to Eyelid and eyelash topically two times a day for Dry eyes   FOLIC ACID (FOLVITE) 1 MG TABLET    Take 2 mg by mouth daily.   KETOTIFEN (ZADITOR) 0.025 % OPHTHALMIC SOLUTION    Place 1 drop into both eyes 2 (two) times daily.   LEVONORGESTREL-ETHINYL ESTRADIOL (VIENVA) 0.1-20 MG-MCG TABLET     Take 1 tablet by mouth daily.   LEVOTHYROXINE (SYNTHROID, LEVOTHROID) 25 MCG TABLET    Take 25 mcg by mouth daily before breakfast.   METHOCARBAMOL (ROBAXIN) 500 MG TABLET    Take 1-2 tablets (500-1,000 mg total) by mouth every 6 (six) hours as needed for muscle spasms.   METHOTREXATE (RHEUMATREX) 10 MG TABLET    Take 7.5-10 mg by mouth once a week. On 7.5 mg on Thursday mornings and 10 mg on Thursday evenings   MICONAZOLE (ZEASORB-AF) 2 % POWDER    Apply 1 application topically 2 (two) times daily as needed for itching (under stomach).   MONTELUKAST (SINGULAIR) 10 MG TABLET    Take 10 mg by mouth every evening.   MULTIPLE VITAMIN (MULTIVITAMIN WITH MINERALS) TABS TABLET    Take 1 tablet by mouth daily.   MUPIROCIN OINTMENT (BACTROBAN) 2 %    Apply 1 application topically daily.   NABUMETONE (RELAFEN) 500 MG TABLET    Take 500 mg by mouth 2 (two) times daily.   OXYCODONE (OXY IR/ROXICODONE) 5 MG IMMEDIATE RELEASE TABLET    Take 1 tablet (5 mg total) by mouth every 6 (six) hours as needed for severe pain or breakthrough pain.   POTASSIUM CHLORIDE (MICRO-K) 10 MEQ CR CAPSULE    Take 10 mEq by mouth 2 (two) times daily.   PSYLLIUM (METAMUCIL) 58.6 % PACKET    Take 1 packet by mouth daily.   SODIUM CHLORIDE (OCEAN FOR KIDS) 0.65 % NASAL SPRAY    Place 1 spray into the nose 2 (two) times daily.    TRIAMCINOLONE OINTMENT (KENALOG) 0.1 %    Apply 1 application topically 2 (two) times daily as needed (rash).  Modified Medications   No medications on file  Discontinued Medications   No medications on file     SIGNIFICANT DIAGNOSTIC EXAMS   PREVIOUS   06-20-17: pelvic x-ray: 1. No evidence of pelvic fracture. 2. No evidence of femoral neck fracture on suboptimal exam  06-20-17: ct of right knee: Comminuted impacted fracture of the proximal tibia as described. Comminuted fracture of the fibular head.    06-20-17: cervical spine x-ray: 1. There is subluxation between C1 and C2, which may be  ligamentous laxity or potentially from a type 2 odontoid fracture. The odontoid is not well-defined radiographically.  06-21-17: ct of cervical spine: 1. Advanced degenerative changes from the skullbase to C2 appear related to chronic atlanto-occipital AND atlanto-axial instability. There is associated anterior and left lateral subluxation of C1 on C2 with up to severe joint space loss, and significant degenerative osseous remodeling about the anterior C1-odontoid articulation. 2. Subsequent  mild basilar invagination and cervicomedullary junction stenosis. 3. Superimposed more conventional cervical spine degeneration, including mild spondylolisthesis, from C2-C3 to C4-C5, with up to mild additional degenerative spinal stenosis.  06-22-17: right knee x-ray Open reduction and internal fixation of right tibial plateau fracture  TODAY:   07-31-17 thyroid ultrasound: right lobe: 3.1 x 1.3 x 1.7 cm; 3 x 2 x 3 mm; left lobe: 3.9 x 1.6 x 1.5 cm; 9 x 7 x 5 mm; 12 x 9 x 7 mm: suspicious for papillary carcinoma right lobe.    LABS REVIEWED: PREVIOUS    06-20-17: wbc 9.2; hgb 12.6; hct 36.6; mcv 110.9; plt 307; glucose 120; bun 14; creat 0.64 ;k+ 3.7; na++ 143; ca 8.7; liver normal albumin 3.5 06-22-17: wbc 7.1; hgb 10.0; hct 30.3; mcv 113.5; plt 253; glucose 91' bun 31; creat 0.56; k+ 4.1; na++ 141; ca 7.8; liver normal albumin 2.5; mag 1.9; phos 3.4 pre-albumin 20.3; vit D 32.3; calcittriol 59.2; ionized ca 4.6; pth 36; tsh  4.153; free T 4: 0.88 hgb a1c 5.0 06-24-17: wbc 9.0; hgb 9.4; hct 28.8 ;mcv 114.7; plt 255; glucose 99; bun 10; create 0.59; k+ 4.4 ;na++ 138; ca 8.0 free T 3: 2.3  07-30-17: wbc 5.6; hgb 12.3; hct 37.5; mcv 114.2; plt 403; glucose 73; bun 17.8; create 0.58; k+ 4.8; na++ 139; ca 8.5; liver normal albumin 3.6; TSH 7.92  NO NEW LABS  Review of Systems  Constitutional: Negative for malaise/fatigue.  Respiratory: Negative for cough and shortness of breath.   Cardiovascular: Negative for chest  pain, palpitations and leg swelling.  Gastrointestinal: Negative for abdominal pain, constipation and heartburn.  Musculoskeletal: Negative for back pain, joint pain and myalgias.  Skin: Negative.   Neurological: Negative for dizziness.  Endo/Heme/Allergies: Negative.   Psychiatric/Behavioral: The patient is not nervous/anxious.     Physical Exam  Constitutional: She is oriented to person, place, and time. No distress.  Obese   Eyes: Conjunctivae are normal.  Neck: Neck supple. No JVD present. No thyromegaly present.  Thyroid is grainy non-tender  Cardiovascular: Normal rate, regular rhythm and intact distal pulses.   Respiratory: Effort normal and breath sounds normal. No respiratory distress. She has no wheezes.  GI: Soft. Bowel sounds are normal. She exhibits no distension. There is no tenderness.  Musculoskeletal: She exhibits no edema.  Able to move all extremities   Lymphadenopathy:    She has no cervical adenopathy.  Neurological: She is alert and oriented to person, place, and time.  Skin: Skin is warm and dry. She is not diaphoretic.  Psychiatric: She has a normal mood and affect.   ASSESSMENT/ PLAN:  1. Hypothyroidism: multinodular: tsh 7.92; her status worse due to ultrasound results  will continue  synthroid 25 mcg daily will check thyroid antibodies; and thyroglobulin and will setup consult with Dr. Buddy Duty. Will continue to monitor status. Family and patient are in agreement with this plan of care.    Time spent with patient for care plan: 45 minutes: spent discussing medical condition; treatment options needed;  blood work and biopsy procedure.      MD is aware of resident's narcotic use and is in agreement with current plan of care. We will attempt to wean resident as apropriate   Ok Edwards NP [facility]  Contact [phone] Monday through Friday 8am- 5pm  After hours call [phone]

## 2017-08-03 DIAGNOSIS — E041 Nontoxic single thyroid nodule: Secondary | ICD-10-CM | POA: Insufficient documentation

## 2017-08-03 DIAGNOSIS — E039 Hypothyroidism, unspecified: Secondary | ICD-10-CM | POA: Insufficient documentation

## 2017-08-11 ENCOUNTER — Encounter: Payer: Self-pay | Admitting: Adult Health

## 2017-08-11 ENCOUNTER — Non-Acute Institutional Stay (SKILLED_NURSING_FACILITY): Payer: Medicaid Other | Admitting: Adult Health

## 2017-08-11 DIAGNOSIS — L0231 Cutaneous abscess of buttock: Secondary | ICD-10-CM

## 2017-08-11 DIAGNOSIS — B372 Candidiasis of skin and nail: Secondary | ICD-10-CM

## 2017-08-11 DIAGNOSIS — L03317 Cellulitis of buttock: Secondary | ICD-10-CM | POA: Diagnosis not present

## 2017-08-11 NOTE — Progress Notes (Signed)
Location:   [facility] Room Number: 117 A Place of Service:  SNF (31)   CODE STATUS: Full Code  Allergies  Allergen Reactions  . Ceclor [Cefaclor] Hives  . Penicillins Hives     Has patient had a PCN reaction causing immediate rash, facial/tongue/throat swelling, SOB or lightheadedness with hypotension: Unknown Has patient had a PCN reaction causing severe rash involving mucus membranes or skin necrosis: Unknown Has patient had a PCN reaction that required hospitalization: Unknown Has patient had a PCN reaction occurring within the last 10 years: Unknown If all of the above answers are "NO", then may proceed with Cephalosporin use.  . Sulfa Antibiotics Hives and Other (See Comments)    FEVER    Chief Complaint  Patient presents with  . Acute Visit    Skin Tears    HPI:  She has open areas on her bilateral buttocks with Shakeela Rabadan drainage present and bleeding present. These area have been treated for about one-two weeks without getting better. She has a yeast rash in her abdominal folds.  She does say that her butt is tender. There are no reports of fever or chills present.    Past Medical History:  Diagnosis Date  . 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 Closed bicondylar fracture of right tibial plateau 06/20/2017  . 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 Down's syndrome   . GERD (gastroesophageal reflux disease)   . Rheumatoid arthritis (HCC)   . Strabismus     Past Surgical History:  Procedure Laterality Date  . CARDIAC SURGERY    . FOOT ARTHRODESIS, TRIPLE    . ORIF TIBIA PLATEAU Right 06/22/2017   Procedure: OPEN REDUCTION INTERNAL FIXATION (ORIF) TIBIAL PLATEAU;  Surgeon: Myrene Galas, MD;  Location: [facility] OR;  Service: Orthopedics;  Laterality: Right;    Social History   Social History  . Marital status: Single    Spouse name: N/A  . Number of children: N/A  . Years of education: N/A   Occupational History  . Not on  file.   Social History Main Topics  . Smoking status: Never Smoker  . Smokeless tobacco: Never Used  . Alcohol use No  . Drug use: No  . Sexual activity: Not on file   Other Topics Concern  . Not on file   Social History Narrative  . No narrative on file   History reviewed. No pertinent family history.    VITAL SIGNS BP (!) 103/53   Pulse 75   Temp (!) 95.6 F (35.3 C)   Resp 19   Ht 4\' 6"  (1.372 m)   Wt 205 lb 11.2 oz (93.3 kg)   SpO2 96%   BMI 49.60 kg/m   Patient's Medications  New Prescriptions   No medications on file  Previous Medications   ACETAMINOPHEN (TYLENOL) 500 MG TABLET    Take 1-2 tablets (500-1,000 mg total) by mouth every 6 (six) hours as needed for mild pain or moderate pain.   ALBUTEROL (PROVENTIL HFA;VENTOLIN HFA) 108 (90 BASE) MCG/ACT INHALER    Inhale 2 puffs into the lungs every 4 (four) hours as needed for wheezing or shortness of breath.   BECLOMETHASONE (QVAR) 40 MCG/ACT INHALER    Inhale 1 puff into the lungs 2 (two) times daily.   DOCUSATE SODIUM (COLACE) 100 MG CAPSULE    Take 1 capsule (100 mg total) by mouth 2 (two) times daily.   ENOXAPARIN (LOVENOX) 40 MG/0.4ML INJECTION    Inject 0.4  mLs (40 mg total) into the skin daily.   ESOMEPRAZOLE (NEXIUM) 40 MG PACKET    Take 40 mg by mouth daily at 12 noon.   EYELID CLEANSERS (OCUSOFT EYELID CLEANSING) PADS    Apply to Eyelid and eyelash topically two times a day for Dry eyes   FOLIC ACID (FOLVITE) 1 MG TABLET    Take 2 mg by mouth daily.   KETOTIFEN (ZADITOR) 0.025 % OPHTHALMIC SOLUTION    Place 1 drop into both eyes 2 (two) times daily.   LEVONORGESTREL-ETHINYL ESTRADIOL (VIENVA) 0.1-20 MG-MCG TABLET    Take 1 tablet by mouth daily.   LEVOTHYROXINE (SYNTHROID, LEVOTHROID) 25 MCG TABLET    Take 25 mcg by mouth daily before breakfast.   METHOCARBAMOL (ROBAXIN) 500 MG TABLET    Take 1-2 tablets (500-1,000 mg total) by mouth every 6 (six) hours as needed for muscle spasms.   METHOTREXATE  (RHEUMATREX) 10 MG TABLET    Take 7.5-10 mg by mouth once a week. On 7.5 mg on Thursday mornings and 10 mg on Thursday evenings   MICONAZOLE (ZEASORB-AF) 2 % POWDER    Apply 1 application topically 2 (two) times daily as needed for itching (under stomach).   MONTELUKAST (SINGULAIR) 10 MG TABLET    Take 10 mg by mouth every evening.   MULTIPLE VITAMIN (MULTIVITAMIN WITH MINERALS) TABS TABLET    Take 1 tablet by mouth daily.   MUPIROCIN OINTMENT (BACTROBAN) 2 %    Apply 1 application topically daily.   NABUMETONE (RELAFEN) 500 MG TABLET    Take 500 mg by mouth 2 (two) times daily.   OXYCODONE (OXY IR/ROXICODONE) 5 MG IMMEDIATE RELEASE TABLET    Take 5-10 mg by mouth every 6 (six) hours as needed for severe pain.   POTASSIUM CHLORIDE (MICRO-K) 10 MEQ CR CAPSULE    Take 10 mEq by mouth 2 (two) times daily.   PSYLLIUM (METAMUCIL) 58.6 % PACKET    Take 1 packet by mouth daily.   SODIUM CHLORIDE (OCEAN FOR KIDS) 0.65 % NASAL SPRAY    Place 1 spray into the nose 2 (two) times daily.    TRIAMCINOLONE OINTMENT (KENALOG) 0.1 %    Apply 1 application topically 2 (two) times daily as needed (rash).  Modified Medications   No medications on file  Discontinued Medications   OXYCODONE (OXY IR/ROXICODONE) 5 MG IMMEDIATE RELEASE TABLET    Take 1 tablet (5 mg total) by mouth every 6 (six) hours as needed for severe pain or breakthrough pain.     SIGNIFICANT DIAGNOSTIC EXAMS  PREVIOUS   06-20-17: pelvic x-ray: 1. No evidence of pelvic fracture. 2. No evidence of femoral neck fracture on suboptimal exam  06-20-17: ct of right knee: Comminuted impacted fracture of the proximal tibia as described. Comminuted fracture of the fibular head.    06-20-17: cervical spine x-ray: 1. There is subluxation between C1 and C2, which may be ligamentous laxity or potentially from a type 2 odontoid fracture. The odontoid is not well-defined radiographically.  06-21-17: ct of cervical spine: 1. Advanced degenerative changes from  the skullbase to C2 appear related to chronic atlanto-occipital AND atlanto-axial instability. There is associated anterior and left lateral subluxation of C1 on C2 with up to severe joint space loss, and significant degenerative osseous remodeling about the anterior C1-odontoid articulation. 2. 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 3. Superimposed more conventional cervical spine degeneration, including mild spondylolisthesis, from C2-C3 to C4-C5, with up to mild additional degenerative spinal stenosis.  06-22-17: right knee x-ray Open reduction and internal fixation of right tibial plateau fracture  07-31-17 thyroid ultrasound: right lobe: 3.1 x 1.3 x 1.7 cm; 3 x 2 x 3 mm; left lobe: 3.9 x 1.6 x 1.5 cm; 9 x 7 x 5 mm; 12 x 9 x 7 mm: suspicious for papillary carcinoma right lobe.  NO NEW EXAMS    LABS REVIEWED: PREVIOUS    06-20-17: wbc 9.2; hgb 12.6; hct 36.6; mcv 110.9; plt 307; glucose 120; bun 14; creat 0.64 ;k+ 3.7; na++ 143; ca 8.7; liver normal albumin 3.5 06-22-17: wbc 7.1; hgb 10.0; hct 30.3; mcv 113.5; plt 253; glucose 91' bun 31; creat 0.56; k+ 4.1; na++ 141; ca 7.8; liver normal albumin 2.5; mag 1.9; phos 3.4 pre-albumin 20.3; vit D 32.3; calcittriol 59.2; ionized ca 4.6; pth 36; tsh  4.153; free T 4: 0.88 hgb a1c 5.0 06-24-17: wbc 9.0; hgb 9.4; hct 28.8 ;mcv 114.7; plt 255; glucose 99; bun 10; create 0.59; k+ 4.4 ;na++ 138; ca 8.0 free T 3: 2.3  07-30-17: wbc 5.6; hgb 12.3; hct 37.5; mcv 114.2; plt 403; glucose 73; bun 17.8; create 0.58; k+ 4.8; na++ 139; ca 8.5; liver normal albumin 3.6; TSH 7.92  NO NEW LABS  Review of Systems  Constitutional: Negative for malaise/fatigue.  Respiratory: Negative for cough and shortness of breath.   Cardiovascular: Negative for chest pain, palpitations and leg swelling.  Gastrointestinal: Negative for abdominal pain, constipation and heartburn.  Musculoskeletal: Negative for back pain, joint pain and myalgias.    Skin:       Has open areas on her butt And rashes   Neurological: Negative for dizziness.  Endo/Heme/Allergies: Negative.   Psychiatric/Behavioral: The patient is not nervous/anxious.    Physical Exam  Constitutional: She is oriented to person, place, and time. No distress.  Obese   Eyes: Conjunctivae are normal.  Neck: Neck supple. No JVD present. No thyromegaly present.  Thyroid is grainy non-tender  Cardiovascular: Normal rate, regular rhythm and intact distal pulses.   Respiratory: Effort normal and breath sounds normal. No respiratory distress. She has no wheezes.  GI: Soft. Bowel sounds are normal. She exhibits no distension. There is no tenderness.  Musculoskeletal: She exhibits no edema.  Able to move all extremities   Lymphadenopathy:    She has no cervical adenopathy.  Neurological: She is alert and oriented to person, place, and time.  Skin: Skin is warm and dry. She is not diaphoretic.  Bilateral buttocks near anus with open, bloody and purulent present  Has yeast rash on her abdominal folds  Psychiatric: She has a normal mood and affect.   ASSESSMENT/ PLAN:  1.Cellulitis on buttocks; will continue current wound treatment; will begin doxycyline 100 mg twice daiy for 10 days with florastor  2. Yeast rash on abdominal folds: will begin diflucan 100 mg daily for 5 days.     MD is aware of resident's narcotic use and is in agreement with current plan of care. We will attempt to wean resident as apropriate     Synthia Innocent NP [facility]  Contact [phone] Monday through Friday 8am- 5pm  After hours call [phone]

## 2017-08-23 ENCOUNTER — Other Ambulatory Visit: Payer: Self-pay | Admitting: Internal Medicine

## 2017-08-23 DIAGNOSIS — E042 Nontoxic multinodular goiter: Secondary | ICD-10-CM

## 2017-08-24 ENCOUNTER — Other Ambulatory Visit: Payer: Self-pay | Admitting: Internal Medicine

## 2017-08-24 DIAGNOSIS — E042 Nontoxic multinodular goiter: Secondary | ICD-10-CM

## 2017-08-27 ENCOUNTER — Ambulatory Visit
Admission: RE | Admit: 2017-08-27 | Discharge: 2017-08-27 | Disposition: A | Discharge status: Home / Self Care | Payer: PRIVATE HEALTH INSURANCE | Source: Ambulatory Visit | Attending: Internal Medicine | Admitting: Internal Medicine

## 2017-08-27 DIAGNOSIS — E042 Nontoxic multinodular goiter: Secondary | ICD-10-CM

## 2017-09-01 ENCOUNTER — Encounter: Payer: Self-pay | Admitting: Adult Health

## 2017-09-01 ENCOUNTER — Non-Acute Institutional Stay (SKILLED_NURSING_FACILITY): Payer: PRIVATE HEALTH INSURANCE | Admitting: Adult Health

## 2017-09-01 DIAGNOSIS — K219 Gastro-esophageal reflux disease without esophagitis: Secondary | ICD-10-CM | POA: Diagnosis not present

## 2017-09-01 DIAGNOSIS — S82141A Displaced bicondylar fracture of right tibia, initial encounter for closed fracture: Secondary | ICD-10-CM | POA: Diagnosis not present

## 2017-09-01 DIAGNOSIS — E063 Autoimmune thyroiditis: Secondary | ICD-10-CM

## 2017-09-01 DIAGNOSIS — E876 Hypokalemia: Secondary | ICD-10-CM

## 2017-09-01 DIAGNOSIS — M069 Rheumatoid arthritis, unspecified: Secondary | ICD-10-CM | POA: Diagnosis not present

## 2017-09-01 NOTE — Progress Notes (Signed)
Location:   [facility] Room Number: 117 A Place of Service:  SNF (31)   CODE STATUS: Full Code  Allergies  Allergen Reactions  . Ceclor [Cefaclor] Hives  . Penicillins Hives     Has patient had a PCN reaction causing immediate rash, facial/tongue/throat swelling, SOB or lightheadedness with hypotension: Unknown Has patient had a PCN reaction causing severe rash involving mucus membranes or skin necrosis: Unknown Has patient had a PCN reaction that required hospitalization: Unknown Has patient had a PCN reaction occurring within the last 10 years: Unknown If all of the above answers are "NO", then may proceed with Cephalosporin use.  . Sulfa Antibiotics Hives and Other (See Comments)    FEVER    Chief Complaint  Patient presents with  . Medical Management of Chronic Issues    RA; GERD; hypokalemia; right tibial fracture and thyroiditis     HPI:  She is a 36 year old short term resident of this facility being seen for the management of her chronic illnesses: RA, GERD, hypokalemia; right tibial fracture; and thyroiditis. She is now weight bearing on her left leg. She does not have pain; no sore throat; no joint pain; no heart burn.  She continues to participate in therapy and is progressing. There are no nursing concerns at this time.   Past Medical History:  Diagnosis Date  . 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 Closed bicondylar fracture of right tibial plateau 06/20/2017  . 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 Down's syndrome   . GERD (gastroesophageal reflux disease)   . Rheumatoid arthritis (HCC)   . Strabismus     Past Surgical History:  Procedure Laterality Date  . CARDIAC SURGERY    . FOOT ARTHRODESIS, TRIPLE    . ORIF TIBIA PLATEAU Right 06/22/2017   Procedure: OPEN REDUCTION INTERNAL FIXATION (ORIF) TIBIAL PLATEAU;  Surgeon: Myrene Galas, MD;  Location: [facility] OR;  Service: Orthopedics;  Laterality: Right;    Social  History   Social History  . Marital status: Single    Spouse name: N/A  . Number of children: N/A  . Years of education: N/A   Occupational History  . Not on file.   Social History Main Topics  . Smoking status: Never Smoker  . Smokeless tobacco: Never Used  . Alcohol use No  . Drug use: No  . Sexual activity: Not on file   Other Topics Concern  . Not on file   Social History Narrative  . No narrative on file   History reviewed. No pertinent family history.    VITAL SIGNS BP (!) 146/93   Pulse 78   Temp (!) 96.5 F (35.8 C)   Resp 18   Ht 4\' 6"  (1.372 m)   Wt 206 lb 6.4 oz (93.6 kg)   SpO2 96%   BMI 49.77 kg/m   Patient's Medications  New Prescriptions   No medications on file  Previous Medications   ACETAMINOPHEN (TYLENOL) 500 MG TABLET    Take 1-2 tablets (500-1,000 mg total) by mouth every 6 (six) hours as needed for mild pain or moderate pain.   BECLOMETHASONE (QVAR) 40 MCG/ACT INHALER    Inhale 1 puff into the lungs 2 (two) times daily.   DOCUSATE SODIUM (COLACE) 100 MG CAPSULE    Take 1 capsule (100 mg total) by mouth 2 (two) times daily.   ENOXAPARIN (LOVENOX) 40 MG/0.4ML INJECTION    Inject 0.4 mLs (40 mg total) into the skin  daily.   ESOMEPRAZOLE (NEXIUM) 40 MG PACKET    Take 40 mg by mouth daily at 12 noon.   EYELID CLEANSERS (OCUSOFT EYELID CLEANSING) PADS    Apply to Eyelid and eyelash topically two times a day for Dry eyes   FOLIC ACID (FOLVITE) 1 MG TABLET    Take 2 mg by mouth daily.   KETOTIFEN (ZADITOR) 0.025 % OPHTHALMIC SOLUTION    Place 1 drop into both eyes 2 (two) times daily.   LEVONORGESTREL-ETHINYL ESTRADIOL (VIENVA) 0.1-20 MG-MCG TABLET    Take 1 tablet by mouth daily.   LEVOTHYROXINE (SYNTHROID, LEVOTHROID) 25 MCG TABLET    Take 25 mcg by mouth daily before breakfast.   METHOCARBAMOL (ROBAXIN) 500 MG TABLET    Take 1-2 tablets (500-1,000 mg total) by mouth every 6 (six) hours as needed for muscle spasms.   METHOTREXATE (RHEUMATREX) 10  MG TABLET    Take 7.5-10 mg by mouth once a week. On 7.5 mg on Thursday mornings and 10 mg on Thursday evenings   MONTELUKAST (SINGULAIR) 10 MG TABLET    Take 10 mg by mouth every evening.   MULTIPLE VITAMIN (MULTIVITAMIN WITH MINERALS) TABS TABLET    Take 1 tablet by mouth daily.   MUPIROCIN OINTMENT (BACTROBAN) 2 %    Apply 1 application topically daily.   NABUMETONE (RELAFEN) 500 MG TABLET    Take 500 mg by mouth 2 (two) times daily.   POTASSIUM CHLORIDE (MICRO-K) 10 MEQ CR CAPSULE    Take 10 mEq by mouth 2 (two) times daily.   PSYLLIUM (METAMUCIL) 58.6 % PACKET    Take 1 packet by mouth daily.   SODIUM CHLORIDE (OCEAN FOR KIDS) 0.65 % NASAL SPRAY    Place 1 spray into the nose 2 (two) times daily.   Modified Medications   No medications on file  Discontinued Medications   ALBUTEROL (PROVENTIL HFA;VENTOLIN HFA) 108 (90 BASE) MCG/ACT INHALER    Inhale 2 puffs into the lungs every 4 (four) hours as needed for wheezing or shortness of breath.   MICONAZOLE (ZEASORB-AF) 2 % POWDER    Apply 1 application topically 2 (two) times daily as needed for itching (under stomach).   OXYCODONE (OXY IR/ROXICODONE) 5 MG IMMEDIATE RELEASE TABLET    Take 5-10 mg by mouth every 6 (six) hours as needed for severe pain.   TRAMADOL (ULTRAM) 50 MG TABLET    Take 1/2 tablet by mouth every 8 hours as needed for pain not relieved by Tylenol   TRIAMCINOLONE OINTMENT (KENALOG) 0.1 %    Apply 1 application topically 2 (two) times daily as needed (rash).     SIGNIFICANT DIAGNOSTIC EXAMS  PREVIOUS   06-20-17: pelvic x-ray: 1. No evidence of pelvic fracture. 2. No evidence of femoral neck fracture on suboptimal exam  06-20-17: ct of right knee: Comminuted impacted fracture of the proximal tibia as described. Comminuted fracture of the fibular head.    06-20-17: cervical spine x-ray: 1. There is subluxation between C1 and C2, which may be ligamentous laxity or potentially from a type 2 odontoid fracture. The odontoid is  not well-defined radiographically.  06-21-17: ct of cervical spine: 1. Advanced degenerative changes from the skullbase to C2 appear related to chronic atlanto-occipital AND atlanto-axial instability. There is associated anterior and left lateral subluxation of C1 on C2 with up to severe joint space loss, and significant degenerative osseous remodeling about the anterior C1-odontoid articulation. 2. 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 3. Superimposed more conventional  cervical spine degeneration, including mild spondylolisthesis, from C2-C3 to C4-C5, with up to mild additional degenerative spinal stenosis.  06-22-17: right knee x-ray Open reduction and internal fixation of right tibial plateau fracture  07-31-17 thyroid ultrasound: right lobe: 3.1 x 1.3 x 1.7 cm; 3 x 2 x 3 mm; left lobe: 3.9 x 1.6 x 1.5 cm; 9 x 7 x 5 mm; 12 x 9 x 7 mm: suspicious for papillary carcinoma right lobe.  NO NEW EXAMS    LABS REVIEWED: PREVIOUS    06-20-17: wbc 9.2; hgb 12.6; hct 36.6; mcv 110.9; plt 307; glucose 120; bun 14; creat 0.64 ;k+ 3.7; na++ 143; ca 8.7; liver normal albumin 3.5 06-22-17: wbc 7.1; hgb 10.0; hct 30.3; mcv 113.5; plt 253; glucose 91' bun 31; creat 0.56; k+ 4.1; na++ 141; ca 7.8; liver normal albumin 2.5; mag 1.9; phos 3.4 pre-albumin 20.3; vit D 32.3; calcittriol 59.2; ionized ca 4.6; pth 36; tsh  4.153; free T 4: 0.88 hgb a1c 5.0 06-24-17: wbc 9.0; hgb 9.4; hct 28.8 ;mcv 114.7; plt 255; glucose 99; bun 10; create 0.59; k+ 4.4 ;na++ 138; ca 8.0 free T 3: 2.3  07-30-17: wbc 5.6; hgb 12.3; hct 37.5; mcv 114.2; plt 403; glucose 73; bun 17.8; create 0.58; k+ 4.8; na++ 139; ca 8.5; liver normal albumin 3.6; TSH 7.92  NO NEW LABS  Review of Systems  Constitutional: Negative for malaise/fatigue.  Respiratory: Negative for cough and shortness of breath.   Cardiovascular: Negative for chest pain, palpitations and leg swelling.  Gastrointestinal: Negative for  abdominal pain, constipation and heartburn.  Musculoskeletal: Negative for back pain, joint pain and myalgias.  Skin: Negative.   Neurological: Negative for dizziness.  Psychiatric/Behavioral: The patient is not nervous/anxious.    Physical Exam  Constitutional: She is oriented to person, place, and time. No distress.  Obese   Eyes: Conjunctivae are normal.  Neck: Neck supple. No JVD present. No thyromegaly present.  Thyroid has grainy texture to palpation   Cardiovascular: Normal rate, regular rhythm and intact distal pulses.   Respiratory: Effort normal and breath sounds normal. No respiratory distress. She has no wheezes.  GI: Soft. Bowel sounds are normal. She exhibits no distension. There is no tenderness.  Musculoskeletal: She exhibits no edema.  Able to move all extremities  Is now weight bearing on the left   Lymphadenopathy:    She has no cervical adenopathy.  Neurological: She is alert and oriented to person, place, and time.  Skin: Skin is warm and dry. She is not diaphoretic.  Psychiatric: She has a normal mood and affect.     ASSESSMENT/ PLAN:  TODAY:   1. RA: will continue folic acid 2 mg daily  will continue methotrexate 7.5 mg in AM and 10 mg PM weekly and relafen 500 mg twice daily   2. GERD: will continue nexium 40 mg daily   3. Hypokalemia: k+ 4.4 will continue k+ 10 meq twice daily   4. Right closed bicondylar tibial plateau fracture: is status post ORIF: will continue to follow up with orthopedics; is non weight bearing on right lower extremity; will continue therapy as directed; has ultram 25 mg every 8 hours as needed; has robaxin 500 mg or 1000 mg every 6 hours as needed  5. hashimoto's thyroiditis: is stable: tsh  4.153 will continue synthroid 25 mcg daily her her endocrinology is pending. Marland Kitchen    PREVIOUS:     5. Constipation: will continue colace twice daily and metamucil daily  6. Asthma: will continue beclomethasone 1 puff twice daily singular  10 mg daily and albuterol 2 puffs every 4 hours as needed   7. Women's health: will continue vienva 0.1-20 mg/mcg daily      MD is aware of resident's narcotic use and is in agreement with current plan of care. We will attempt to wean resident as apropriate     Synthia Innocent NP [facility]  Contact [phone] Monday through Friday 8am- 5pm  After hours call [phone]

## 2017-09-08 ENCOUNTER — Other Ambulatory Visit (HOSPITAL_COMMUNITY)
Admission: RE | Admit: 2017-09-08 | Discharge: 2017-09-08 | Disposition: A | Discharge status: Home / Self Care | Payer: PRIVATE HEALTH INSURANCE | Source: Ambulatory Visit | Attending: General Surgery | Admitting: General Surgery

## 2017-09-08 ENCOUNTER — Ambulatory Visit
Admission: RE | Admit: 2017-09-08 | Discharge: 2017-09-08 | Disposition: A | Discharge status: Home / Self Care | Payer: PRIVATE HEALTH INSURANCE | Source: Ambulatory Visit | Attending: Internal Medicine | Admitting: Internal Medicine

## 2017-09-08 DIAGNOSIS — E041 Nontoxic single thyroid nodule: Secondary | ICD-10-CM | POA: Insufficient documentation

## 2017-09-08 DIAGNOSIS — E042 Nontoxic multinodular goiter: Secondary | ICD-10-CM

## 2017-09-10 LAB — TSH: TSH: 7.23 — AB (ref 0.41–5.90)

## 2017-09-12 DIAGNOSIS — E063 Autoimmune thyroiditis: Secondary | ICD-10-CM | POA: Insufficient documentation

## 2017-09-13 ENCOUNTER — Other Ambulatory Visit: Payer: Self-pay

## 2017-09-13 MED ORDER — TRAMADOL HCL 50 MG PO TABS: ORAL_TABLET | ORAL | 0 refills | Status: DC

## 2017-09-13 NOTE — Telephone Encounter (Signed)
RX faxed to [facility] @ [phone], phone number [phone] 

## 2017-09-16 ENCOUNTER — Encounter: Payer: Self-pay | Admitting: Adult Health

## 2017-09-16 ENCOUNTER — Non-Acute Institutional Stay (SKILLED_NURSING_FACILITY): Payer: PRIVATE HEALTH INSURANCE | Admitting: Adult Health

## 2017-09-16 DIAGNOSIS — E063 Autoimmune thyroiditis: Secondary | ICD-10-CM

## 2017-09-16 NOTE — Progress Notes (Signed)
Location:   [facility] Room Number: 117 A Place of Service:  SNF (31)   CODE STATUS: Full Code  Allergies  Allergen Reactions  . Ceclor [Cefaclor] Hives  . Penicillins Hives     Has patient had a PCN reaction causing immediate rash, facial/tongue/throat swelling, SOB or lightheadedness with hypotension: Unknown Has patient had a PCN reaction causing severe rash involving mucus membranes or skin necrosis: Unknown Has patient had a PCN reaction that required hospitalization: Unknown Has patient had a PCN reaction occurring within the last 10 years: Unknown If all of the above answers are "NO", then may proceed with Cephalosporin use.  . Sulfa Antibiotics Hives and Other (See Comments)    FEVER    Chief Complaint  Patient presents with  . Acute Visit    Follow up thyroid labs    HPI:  Her tsh is 7.92 with her previous tsh of 7.23. She has had a biopsy done of her thyroid gland which did not demonstrate cancer. She states that she feels good; no increased lethargy; no excessive dry skin; no complaints of depression or anxiety. There are no nursing concerns at this time.   Past Medical History:  Diagnosis Date  . 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 Closed bicondylar fracture of right tibial plateau 06/20/2017  . 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 Down's syndrome   . GERD (gastroesophageal reflux disease)   . Rheumatoid arthritis (HCC)   . Strabismus     Past Surgical History:  Procedure Laterality Date  . CARDIAC SURGERY    . FOOT ARTHRODESIS, TRIPLE    . ORIF TIBIA PLATEAU Right 06/22/2017   Procedure: OPEN REDUCTION INTERNAL FIXATION (ORIF) TIBIAL PLATEAU;  Surgeon: Myrene Galas, MD;  Location: [facility] OR;  Service: Orthopedics;  Laterality: Right;    Social History   Social History  . Marital status: Single    Spouse name: N/A  . Number of children: N/A  . Years of education: N/A   Occupational History  . Not on  file.   Social History Main Topics  . Smoking status: Never Smoker  . Smokeless tobacco: Never Used  . Alcohol use No  . Drug use: No  . Sexual activity: Not on file   Other Topics Concern  . Not on file   Social History Narrative  . No narrative on file   History reviewed. No pertinent family history.    VITAL SIGNS BP (!) 132/92   Pulse 77   Temp (!) 96.2 F (35.7 C)   Resp 18   Ht 4\' 6"  (1.372 m)   Wt 202 lb 4.8 oz (91.8 kg)   SpO2 99%   BMI 48.78 kg/m   Patient's Medications  New Prescriptions   No medications on file  Previous Medications   ACETAMINOPHEN (TYLENOL) 500 MG TABLET    Take 1-2 tablets (500-1,000 mg total) by mouth every 6 (six) hours as needed for mild pain or moderate pain.   BECLOMETHASONE (QVAR) 40 MCG/ACT INHALER    Inhale 1 puff into the lungs 2 (two) times daily.   DOCUSATE SODIUM (COLACE) 100 MG CAPSULE    Take 1 capsule (100 mg total) by mouth 2 (two) times daily.   ESOMEPRAZOLE (NEXIUM) 40 MG PACKET    Take 40 mg by mouth daily at 12 noon.   EYELID CLEANSERS (OCUSOFT EYELID CLEANSING) PADS    Apply to Eyelid and eyelash topically two times a day for Dry eyes  FOLIC ACID (FOLVITE) 1 MG TABLET    Take 2 mg by mouth daily.   KETOTIFEN (ZADITOR) 0.025 % OPHTHALMIC SOLUTION    Place 1 drop into both eyes 2 (two) times daily.   LEVONORGESTREL-ETHINYL ESTRADIOL (VIENVA) 0.1-20 MG-MCG TABLET    Take 1 tablet by mouth daily.   LEVOTHYROXINE (SYNTHROID, LEVOTHROID) 25 MCG TABLET    Take 25 mcg by mouth daily before breakfast.   METHOCARBAMOL (ROBAXIN) 500 MG TABLET    Take 1-2 tablets (500-1,000 mg total) by mouth every 6 (six) hours as needed for muscle spasms.   METHOTREXATE (RHEUMATREX) 10 MG TABLET    Take 7.5-10 mg by mouth once a week. On 7.5 mg on Thursday mornings and 10 mg on Thursday evenings   MONTELUKAST (SINGULAIR) 10 MG TABLET    Take 10 mg by mouth every evening.   MULTIPLE VITAMIN (MULTIVITAMIN WITH MINERALS) TABS TABLET    Take 1  tablet by mouth daily.   MUPIROCIN OINTMENT (BACTROBAN) 2 %    Apply 1 application topically daily.   NABUMETONE (RELAFEN) 500 MG TABLET    Take 500 mg by mouth 2 (two) times daily.   POTASSIUM CHLORIDE (MICRO-K) 10 MEQ CR CAPSULE    Take 10 mEq by mouth 2 (two) times daily.   PSYLLIUM PO    Give 1 capsule by mouth one time daily   SODIUM CHLORIDE (OCEAN FOR KIDS) 0.65 % NASAL SPRAY    Place 1 spray into the nose 2 (two) times daily.    TRAMADOL (ULTRAM) 50 MG TABLET    Give 0.5 tablet by mouth every 8 hours as needed for breakthrough pain for pain not relieved by Tylenol  Modified Medications   No medications on file  Discontinued Medications   ENOXAPARIN (LOVENOX) 40 MG/0.4ML INJECTION    Inject 0.4 mLs (40 mg total) into the skin daily.     SIGNIFICANT DIAGNOSTIC EXAMS  PREVIOUS   06-20-17: pelvic x-ray: 1. No evidence of pelvic fracture. 2. No evidence of femoral neck fracture on suboptimal exam  06-20-17: ct of right knee: Comminuted impacted fracture of the proximal tibia as described. Comminuted fracture of the fibular head.    06-20-17: cervical spine x-ray: 1. There is subluxation between C1 and C2, which may be ligamentous laxity or potentially from a type 2 odontoid fracture. The odontoid is not well-defined radiographically.  06-21-17: ct of cervical spine: 1. Advanced degenerative changes from the skullbase to C2 appear related to chronic atlanto-occipital AND atlanto-axial instability. There is associated anterior and left lateral subluxation of C1 on C2 with up to severe joint space loss, and significant degenerative osseous remodeling about the anterior C1-odontoid articulation. 2. 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 3. Superimposed more conventional cervical spine degeneration, including mild spondylolisthesis, from C2-C3 to C4-C5, with up to mild additional degenerative spinal stenosis.  06-22-17: right knee x-ray Open reduction and  internal fixation of right tibial plateau fracture  07-31-17 thyroid ultrasound: right lobe: 3.1 x 1.3 x 1.7 cm; 3 x 2 x 3 mm; left lobe: 3.9 x 1.6 x 1.5 cm; 9 x 7 x 5 mm; 12 x 9 x 7 mm: suspicious for papillary carcinoma right lobe.  NO NEW EXAMS    LABS REVIEWED: PREVIOUS    06-20-17: wbc 9.2; hgb 12.6; hct 36.6; mcv 110.9; plt 307; glucose 120; bun 14; creat 0.64 ;k+ 3.7; na++ 143; ca 8.7; liver normal albumin 3.5 06-22-17: wbc 7.1; hgb 10.0; hct 30.3; mcv 113.5; plt 253;  glucose 91' bun 31; creat 0.56; k+ 4.1; na++ 141; ca 7.8; liver normal albumin 2.5; mag 1.9; phos 3.4 pre-albumin 20.3; vit D 32.3; calcittriol 59.2; ionized ca 4.6; pth 36; tsh  4.153; free T 4: 0.88 hgb a1c 5.0 06-24-17: wbc 9.0; hgb 9.4; hct 28.8 ;mcv 114.7; plt 255; glucose 99; bun 10; create 0.59; k+ 4.4 ;na++ 138; ca 8.0 free T 3: 2.3  07-30-17: wbc 5.6; hgb 12.3; hct 37.5; mcv 114.2; plt 403; glucose 73; bun 17.8; create 0.58; k+ 4.8; na++ 139; ca 8.5; liver normal albumin 3.6; TSH 7.92  TODAY:   07-30-17: tsh 7.92   Review of Systems  Constitutional: Negative for malaise/fatigue.  Respiratory: Negative for cough and shortness of breath.   Cardiovascular: Negative for chest pain, palpitations and leg swelling.  Gastrointestinal: Negative for abdominal pain, constipation and heartburn.  Musculoskeletal: Negative for back pain, joint pain and myalgias.  Skin: Negative.   Neurological: Negative for dizziness.  Psychiatric/Behavioral: The patient is not nervous/anxious.    Physical Exam  Constitutional: She is oriented to person, place, and time. No distress.  Obese   Eyes: Conjunctivae are normal.  Neck: Neck supple. No JVD present. No thyromegaly present.  Thyroid has grainy texture to palpation   Cardiovascular: Normal rate, regular rhythm and intact distal pulses.   Respiratory: Effort normal and breath sounds normal. No respiratory distress. She has no wheezes.  GI: Soft. Bowel sounds are normal. She exhibits  no distension. There is no tenderness.  Musculoskeletal: She exhibits no edema.  Able to move all extremities  Is now weight bearing on the left   Lymphadenopathy:    She has no cervical adenopathy.  Neurological: She is alert and oriented to person, place, and time.  Skin: Skin is warm and dry. She is not diaphoretic.  Psychiatric: She has a normal mood and affect.     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       MD is aware of resident's narcotic use and is in agreement with current plan of care. We will attempt to wean resident as apropriate     Synthia Innocent NP [facility]  Contact [phone] Monday through Friday 8am- 5pm  After hours call [phone]

## 2017-10-01 ENCOUNTER — Encounter: Payer: Self-pay | Admitting: Adult Health

## 2017-10-01 ENCOUNTER — Non-Acute Institutional Stay (SKILLED_NURSING_FACILITY): Payer: PRIVATE HEALTH INSURANCE | Admitting: Adult Health

## 2017-10-01 DIAGNOSIS — Q909 Down syndrome, unspecified: Secondary | ICD-10-CM | POA: Diagnosis not present

## 2017-10-01 DIAGNOSIS — E063 Autoimmune thyroiditis: Secondary | ICD-10-CM

## 2017-10-01 DIAGNOSIS — S82141A Displaced bicondylar fracture of right tibia, initial encounter for closed fracture: Secondary | ICD-10-CM

## 2017-10-01 NOTE — Progress Notes (Signed)
Location:   [facility] Room Number: 117 A Place of Service:  SNF (31)    CODE STATUS: Full Code  Allergies  Allergen Reactions  . Ceclor [Cefaclor] Hives  . Penicillins Hives     Has patient had a PCN reaction causing immediate rash, facial/tongue/throat swelling, SOB or lightheadedness with hypotension: Unknown Has patient had a PCN reaction causing severe rash involving mucus membranes or skin necrosis: Unknown Has patient had a PCN reaction that required hospitalization: Unknown Has patient had a PCN reaction occurring within the last 10 years: Unknown If all of the above answers are "NO", then may proceed with Cephalosporin use.  . Sulfa Antibiotics Hives and Other (See Comments)    FEVER    Chief Complaint  Patient presents with  . Discharge Note    Discharging to Home    HPI:  She is being discharged to home with home health for RN services. She will need a standard wheelchair with elevated leg rests; cushion; anti-tippers; and brake extensions. Will need her prescriptions written and will need to follow up with her medical provider.  She had been hospitalized for a right tibial fracture. She had been admitted to this facility for short term rehab. She was found to have hashimoto's thyroiditis and is being treated for this disease     Past Medical History:  Diagnosis Date  . 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 Closed bicondylar fracture of right tibial plateau 06/20/2017  . 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 Down's syndrome   . GERD (gastroesophageal reflux disease)   . Rheumatoid arthritis (HCC)   . Strabismus     Past Surgical History:  Procedure Laterality Date  . CARDIAC SURGERY    . FOOT ARTHRODESIS, TRIPLE    . ORIF TIBIA PLATEAU Right 06/22/2017   Procedure: OPEN REDUCTION INTERNAL FIXATION (ORIF) TIBIAL PLATEAU;  Surgeon: Myrene Galas, MD;  Location: [facility] OR;  Service: Orthopedics;  Laterality: Right;     Social History   Social History  . Marital status: Single    Spouse name: N/A  . Number of children: N/A  . Years of education: N/A   Occupational History  . Not on file.   Social History Main Topics  . Smoking status: Never Smoker  . Smokeless tobacco: Never Used  . Alcohol use No  . Drug use: No  . Sexual activity: Not on file   Other Topics Concern  . Not on file   Social History Narrative  . No narrative on file   History reviewed. No pertinent family history.  VITAL SIGNS Ht 4\' 6"  (1.372 m)   Wt 202 lb 4.8 oz (91.8 kg)   BMI 48.78 kg/m   Patient's Medications  New Prescriptions   No medications on file  Previous Medications   ACETAMINOPHEN (TYLENOL) 500 MG TABLET    Take 1-2 tablets (500-1,000 mg total) by mouth every 6 (six) hours as needed for mild pain or moderate pain.   BECLOMETHASONE (QVAR) 40 MCG/ACT INHALER    Inhale 1 puff into the lungs 2 (two) times daily.   DOCUSATE SODIUM (COLACE) 100 MG CAPSULE    Take 1 capsule (100 mg total) by mouth 2 (two) times daily.   ESOMEPRAZOLE (NEXIUM) 40 MG PACKET    Take 40 mg by mouth daily at 12 noon.   EYELID CLEANSERS (OCUSOFT EYELID CLEANSING) PADS    Apply to Eyelid and eyelash topically two times a day for Dry eyes  FOLIC ACID (FOLVITE) 1 MG TABLET    Take 2 mg by mouth daily.   KETOTIFEN (ZADITOR) 0.025 % OPHTHALMIC SOLUTION    Place 1 drop into both eyes 2 (two) times daily.   LEVONORGESTREL-ETHINYL ESTRADIOL (VIENVA) 0.1-20 MG-MCG TABLET    Take 1 tablet by mouth daily.   LEVOTHYROXINE (SYNTHROID, LEVOTHROID) 25 MCG TABLET    Give 1.5 (37.5 mcg) tablet by mouth in the morning   METHOCARBAMOL (ROBAXIN) 500 MG TABLET    Take 1-2 tablets (500-1,000 mg total) by mouth every 6 (six) hours as needed for muscle spasms.   METHOTREXATE (RHEUMATREX) 10 MG TABLET    Take 7.5-10 mg by mouth once a week. On 7.5 mg on Thursday mornings and 10 mg on Thursday evenings   MONTELUKAST (SINGULAIR) 10 MG TABLET    Take 10 mg  by mouth every evening.   MULTIPLE VITAMIN (MULTIVITAMIN WITH MINERALS) TABS TABLET    Take 1 tablet by mouth daily.   MUPIROCIN OINTMENT (BACTROBAN) 2 %    Apply 1 application topically daily.   NABUMETONE (RELAFEN) 500 MG TABLET    Take 500 mg by mouth 2 (two) times daily.   POTASSIUM CHLORIDE (MICRO-K) 10 MEQ CR CAPSULE    Take 10 mEq by mouth 2 (two) times daily.   PSYLLIUM PO    Give 1 capsule by mouth one time daily   SODIUM CHLORIDE (OCEAN FOR KIDS) 0.65 % NASAL SPRAY    Place 1 spray into the nose 2 (two) times daily.    TRAMADOL (ULTRAM) 50 MG TABLET    Give 0.5 tablet by mouth every 8 hours as needed for breakthrough pain for pain not relieved by Tylenol  Modified Medications   No medications on file  Discontinued Medications   No medications on file     SIGNIFICANT DIAGNOSTIC EXAMS  PREVIOUS   06-20-17: pelvic x-ray: 1. No evidence of pelvic fracture. 2. No evidence of femoral neck fracture on suboptimal exam  06-20-17: ct of right knee: Comminuted impacted fracture of the proximal tibia as described. Comminuted fracture of the fibular head.    06-20-17: cervical spine x-ray: 1. There is subluxation between C1 and C2, which may be ligamentous laxity or potentially from a type 2 odontoid fracture. The odontoid is not well-defined radiographically.  06-21-17: ct of cervical spine: 1. Advanced degenerative changes from the skullbase to C2 appear related to chronic atlanto-occipital AND atlanto-axial instability. There is associated anterior and left lateral subluxation of C1 on C2 with up to severe joint space loss, and significant degenerative osseous remodeling about the anterior C1-odontoid articulation. 2. 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 3. Superimposed more conventional cervical spine degeneration, including mild spondylolisthesis, from C2-C3 to C4-C5, with up to mild additional degenerative spinal stenosis.  06-22-17: right knee  x-ray Open reduction and internal fixation of right tibial plateau fracture  07-31-17 thyroid ultrasound: right lobe: 3.1 x 1.3 x 1.7 cm; 3 x 2 x 3 mm; left lobe: 3.9 x 1.6 x 1.5 cm; 9 x 7 x 5 mm; 12 x 9 x 7 mm: suspicious for papillary carcinoma right lobe.  NO NEW EXAMS    LABS REVIEWED: PREVIOUS    06-20-17: wbc 9.2; hgb 12.6; hct 36.6; mcv 110.9; plt 307; glucose 120; bun 14; creat 0.64 ;k+ 3.7; na++ 143; ca 8.7; liver normal albumin 3.5 06-22-17: wbc 7.1; hgb 10.0; hct 30.3; mcv 113.5; plt 253; glucose 91' bun 31; creat 0.56; k+ 4.1; na++ 141; ca 7.8;  liver normal albumin 2.5; mag 1.9; phos 3.4 pre-albumin 20.3; vit D 32.3; calcittriol 59.2; ionized ca 4.6; pth 36; tsh  4.153; free T 4: 0.88 hgb a1c 5.0 06-24-17: wbc 9.0; hgb 9.4; hct 28.8 ;mcv 114.7; plt 255; glucose 99; bun 10; create 0.59; k+ 4.4 ;na++ 138; ca 8.0 free T 3: 2.3  07-30-17: wbc 5.6; hgb 12.3; hct 37.5; mcv 114.2; plt 403; glucose 73; bun 17.8; create 0.58; k+ 4.8; na++ 139; ca 8.5; liver normal albumin 3.6; TSH 7.92  NO NEW LABS   Review of Systems  Constitutional: Negative for malaise/fatigue.  Respiratory: Negative for cough and shortness of breath.   Cardiovascular: Negative for chest pain, palpitations and leg swelling.  Gastrointestinal: Negative for abdominal pain, constipation and heartburn.  Musculoskeletal: Negative for back pain, joint pain and myalgias.  Skin: Negative.   Neurological: Negative for dizziness.  Psychiatric/Behavioral: The patient is not nervous/anxious.    Physical Exam  Constitutional: She is oriented to person, place, and time. She appears well-developed and well-nourished. No distress.  Obese   Eyes: Conjunctivae are normal.  Neck: Normal range of motion. Neck supple. No thyromegaly present.  Cardiovascular: Normal rate, regular rhythm, normal heart sounds and intact distal pulses.   Pulmonary/Chest: Effort normal and breath sounds normal. No respiratory distress.  Abdominal: Soft. Bowel  sounds are normal. She exhibits no distension. There is no tenderness.  Musculoskeletal: She exhibits no edema.  Is able to move all extremities Is able to stand; is using wheelchair   Lymphadenopathy:    She has no cervical adenopathy.  Neurological: She is alert and oriented to person, place, and time.  Skin: Skin is warm and dry. She is not diaphoretic.  Psychiatric: She has a normal mood and affect.    ASSESSMENT/ PLAN:  Patient is being discharged with the following home health services:  RN to evaluate and treat as indicated for medication management  Patient is being discharged with the following durable medical equipment:  Standard pediatric wheelchair with elevated leg rests; cushion; anti-tippers; brake extensions to allow her to maintain her current level of independence with her adls; can self propel    Patient has been advised to f/u with their PCP in 1-2 weeks to bring them up to date on their rehab stay.  Social services at facility was responsible for arranging this appointment.  Pt was provided with a 30 day supply of prescriptions for medications and refills must be obtained from their PCP.  For controlled substances, a more limited supply may be provided adequate until PCP appointment only.  Prescriptions have been written for a 30 day supply of her non-narcotic per the medication list as above.  Narcotic prescriptions as follows:   #20 ultram 50 mg tabs  Time spent with discharge process and patient: 45 minutes; she did verbalize understanding of DME; and home health; she does understand exercises she is to do at home.     Synthia Innocent NP [facility]  Contact [phone] Monday through Friday 8am- 5pm  After hours call [phone]

## 2017-10-06 ENCOUNTER — Telehealth: Payer: Self-pay

## 2017-10-06 NOTE — Telephone Encounter (Signed)
Marissa Gray with [employer] called to inquire about methotrexate. A FL2 was completed and the instructions for methotrexate stated 10 mg every morning on Thursday and 7.5 mg on Thursday.  Marissa Gray indicated that she already called [facility] and they were unable to determine what was meant by the above instructions.  I consulted with Dr.Carter and placed Marissa Gray on a brief hold. Call resumed, per Dr.Carter instructions to be given as listed in EPIC Methotrexate 7.5 mg Thursday morning and 10 mg on Thursday evening.  Marissa Gray verbalized understanding of instructions and stated she has never seen the dose split like that before yet will follow doctor orders.

## 2018-03-31 ENCOUNTER — Other Ambulatory Visit: Payer: Self-pay | Admitting: Internal Medicine

## 2018-03-31 DIAGNOSIS — E042 Nontoxic multinodular goiter: Secondary | ICD-10-CM

## 2018-04-21 ENCOUNTER — Other Ambulatory Visit: Payer: Self-pay | Admitting: Physician Assistant

## 2018-04-21 DIAGNOSIS — R5381 Other malaise: Secondary | ICD-10-CM

## 2018-04-26 ENCOUNTER — Other Ambulatory Visit: Payer: Self-pay | Admitting: Physician Assistant

## 2018-04-26 DIAGNOSIS — M8580 Other specified disorders of bone density and structure, unspecified site: Secondary | ICD-10-CM

## 2018-05-24 IMAGING — CR DG CERVICAL SPINE WITH FLEX & EXTEND
9 of 10 series · 9 of 10 positions shown · non-contrast
Comparison: None.

CLINICAL DATA: Down's Syndrome patient is scheduled for orthopedic
leg surgery 2 days from now. ? congenital Atlantoaxial stability s/p
fall yesterday.

EXAM:
CERVICAL SPINE COMPLETE WITH FLEXION AND EXTENSION VIEWS

[w cervical spine lat (1 of 3)]
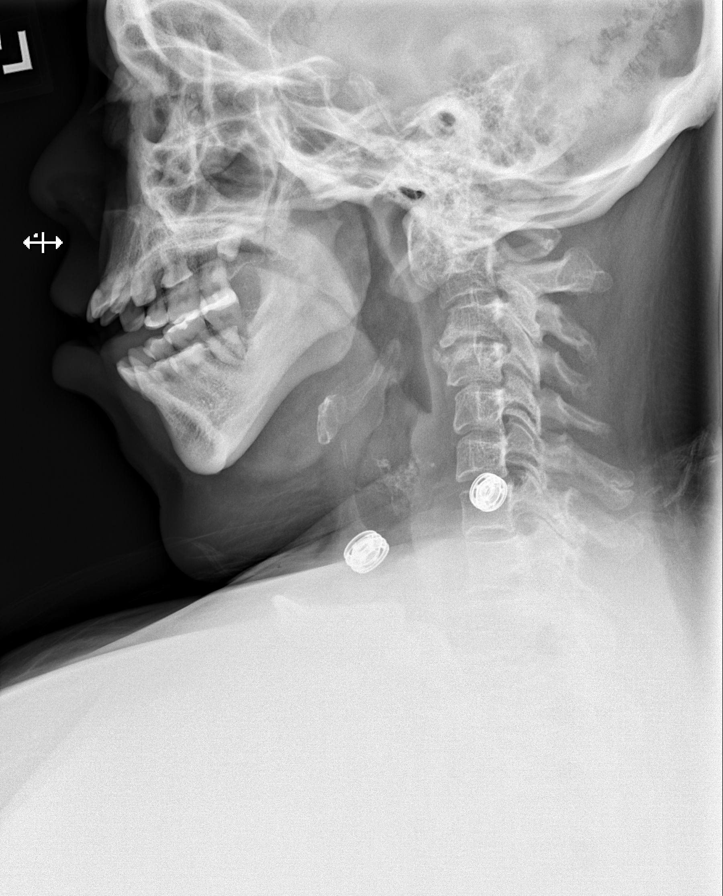

[w cervical spine ap_obl (1 of 5)]
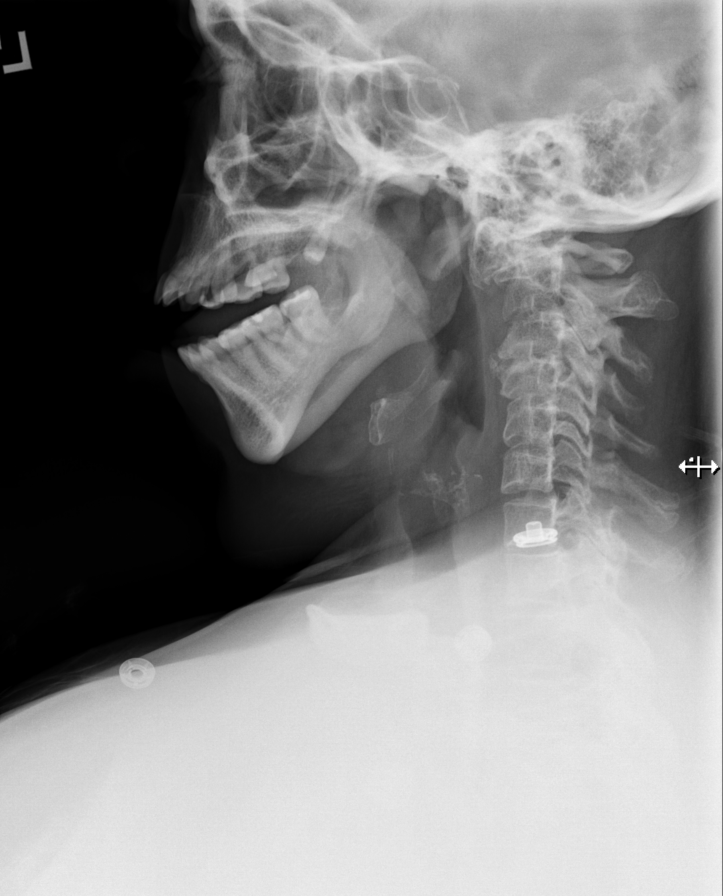

[w cervical spine lat (2 of 3)]
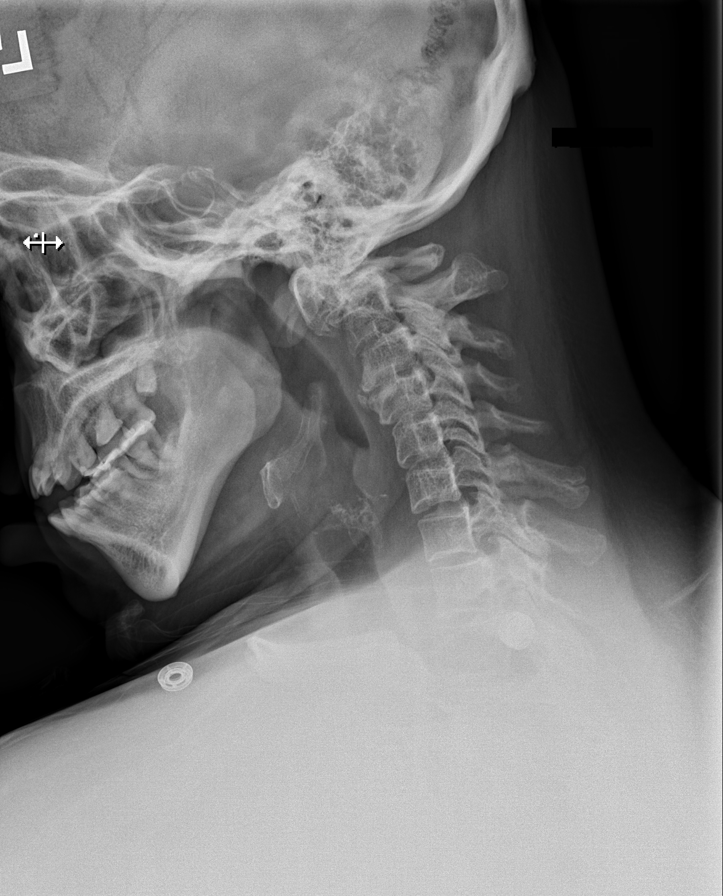

[w cervical spine lat (3 of 3)]
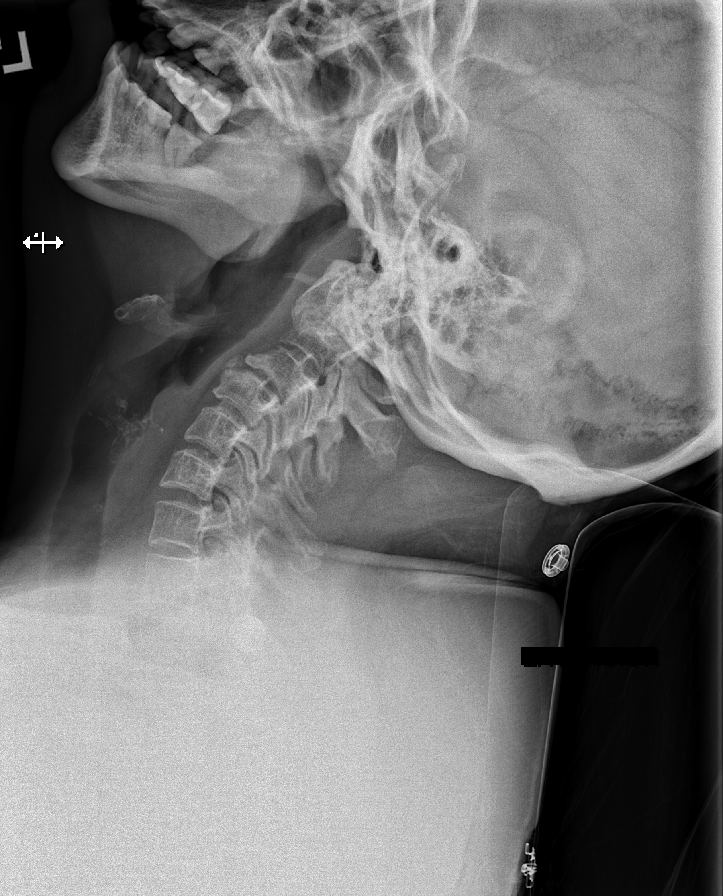

[w cervical spine ap_obl (2 of 5)]
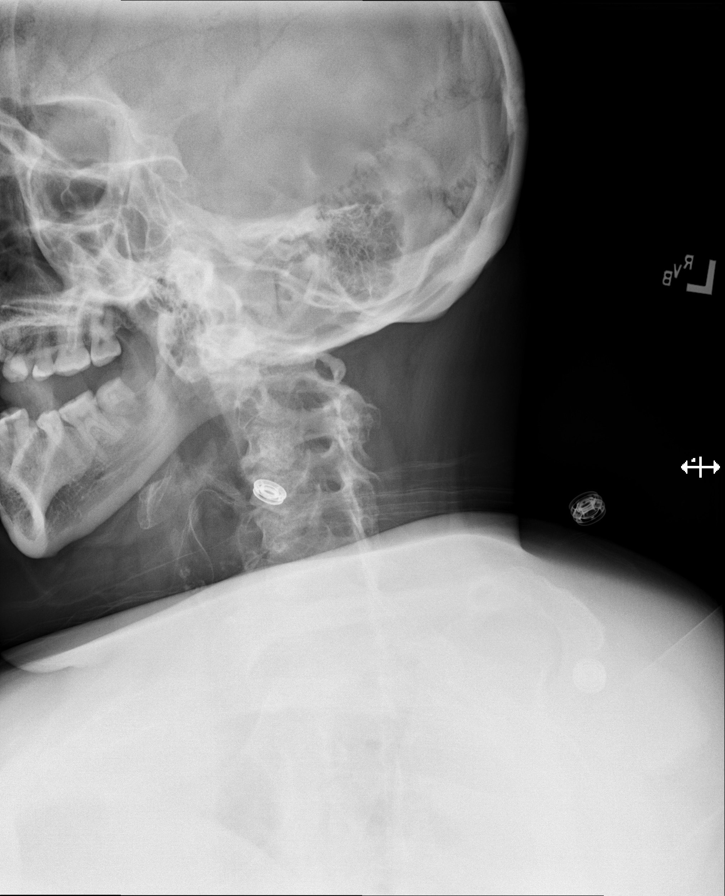

[w cervical spine ap_obl (3 of 5)]
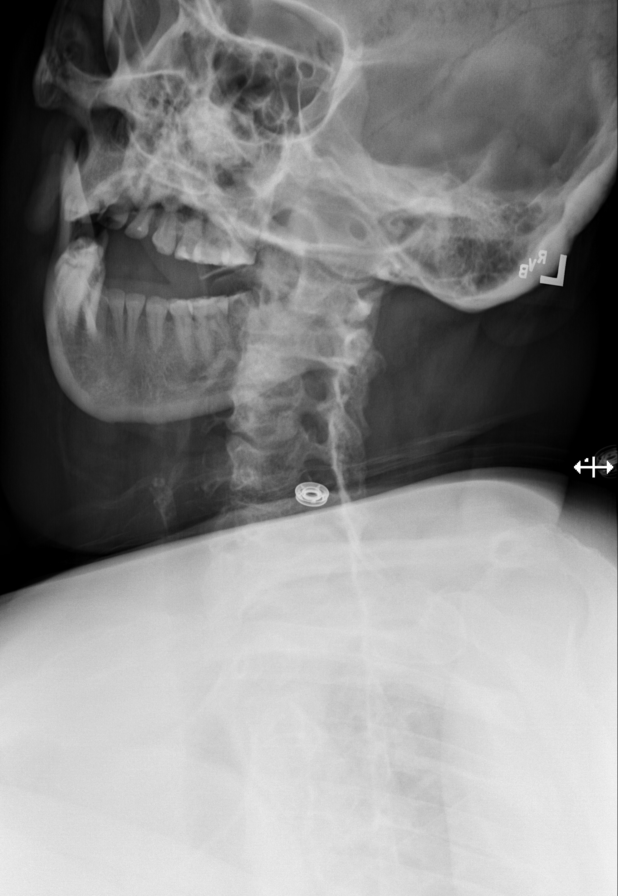

[w cervical spine ap_obl (4 of 5)]
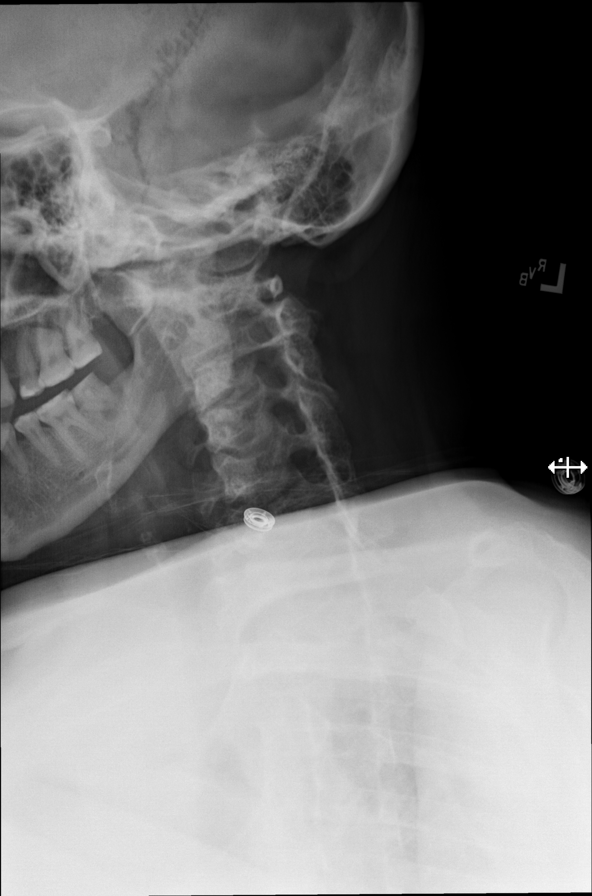

[w cervical spine ap_obl (5 of 5)]
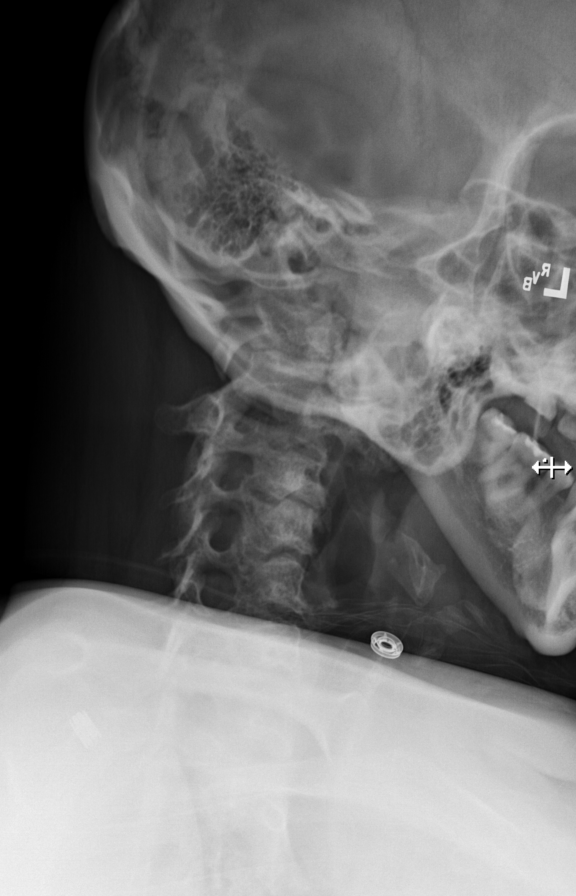

[w cervical spine ap]
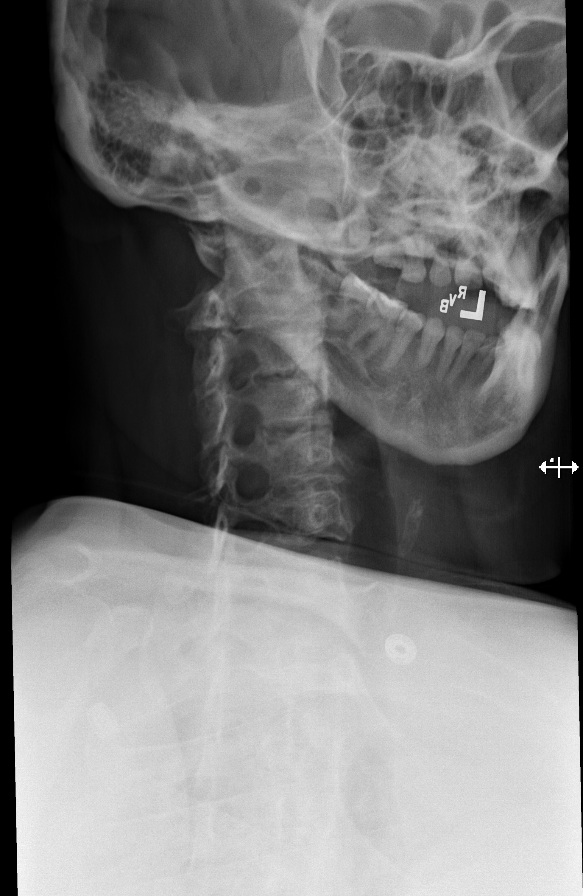

[9 of 10 positions shown; findings below may reference images not displayed]

FINDINGS: The odontoid is not well-defined on this exam. May be fractured at
its base. There is subluxation between C1 and C2. On the flexion
images, the anterior arch of C1 lies 11 mm anterior to the anterior
margin of the C2 vertebral body. On extension, this decreases to 8
mm.

No other evidence of a fracture. No other malalignment or
subluxation.

Soft tissues are unremarkable.
IMPRESSION: 1. There is subluxation between C1 and C2, which may be ligamentous
laxity or potentially from a type 2 odontoid fracture. The odontoid
is not well-defined radiographically. Recommend follow-up CT scan of
the cranial cervical junction for further assessment.

## 2018-05-26 IMAGING — DX DG KNEE 1-2V PORT*R*
2 series · 2 of 2 positions shown · non-contrast
Comparison: CT 06/20/2017. Intraoperative radiographs earlier
today.

CLINICAL DATA: Tibial plateau fracture post ORIF.

EXAM:
PORTABLE RIGHT KNEE - 1-2 VIEW

[knee ap]
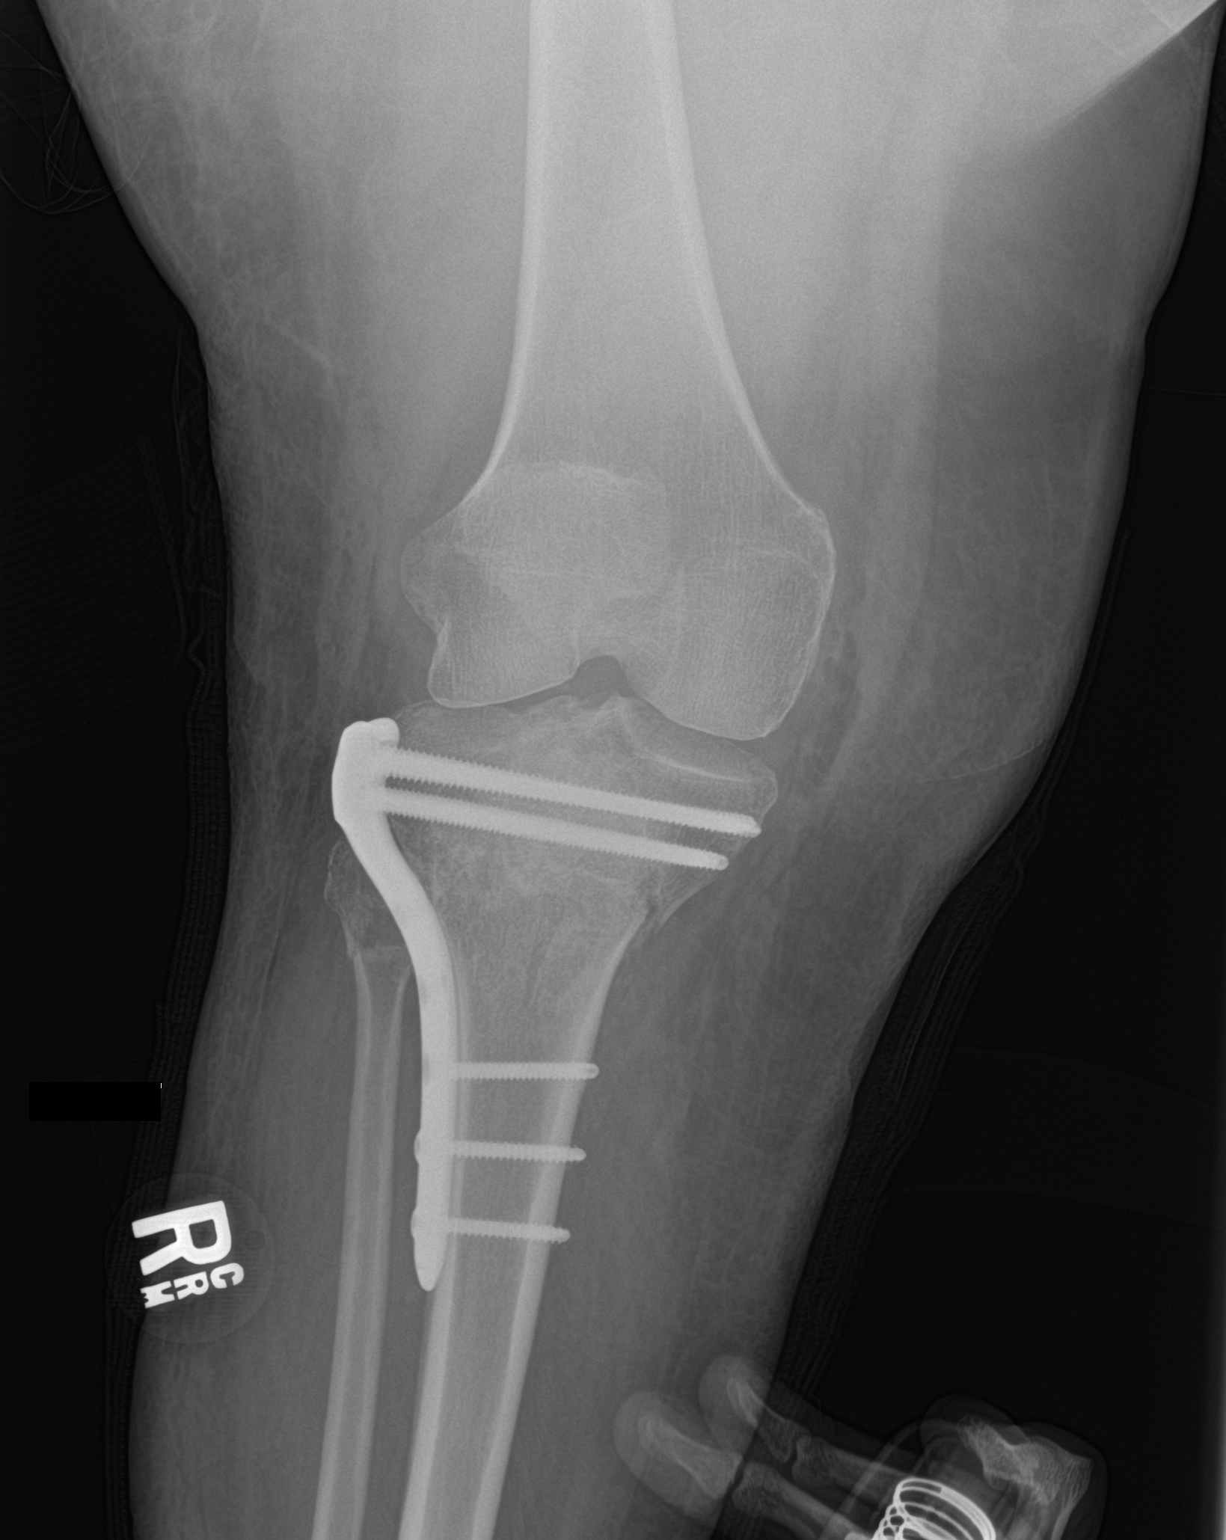

[knee lat]
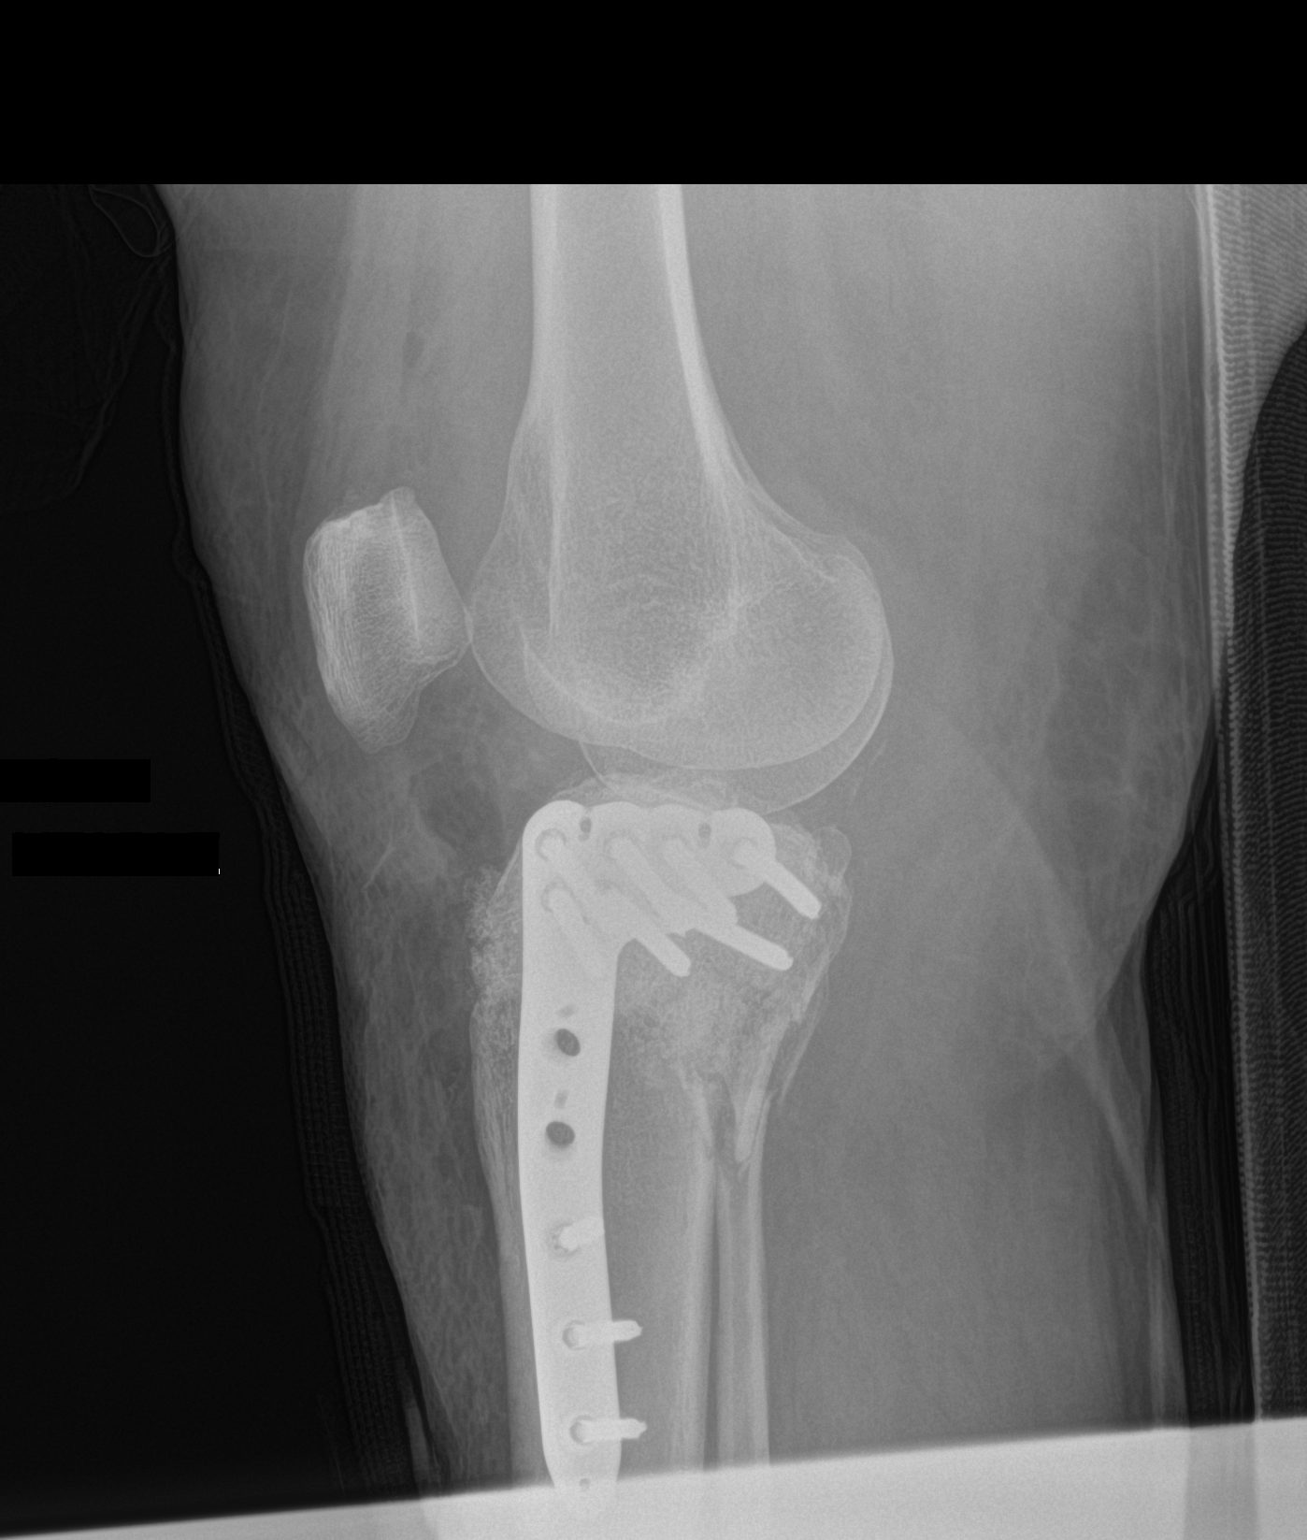

[2 of 2 positions shown; findings below may reference images not displayed]

FINDINGS: AP and lateral views obtained portably demonstrate lateral tibial
plate and screw fixation of the comminuted proximal tibial fracture.
There is near anatomic reduction of the main fracture fragments.
Fracture of the fibular head and neck is minimally displaced. The
distal femur and patella are intact. There is a small joint effusion
with mild soft tissue emphysema.
IMPRESSION: Near anatomic reduction of comminuted fracture of the proximal tibia
post ORIF. No demonstrated complication.

## 2018-06-06 ENCOUNTER — Ambulatory Visit
Admission: RE | Admit: 2018-06-06 | Discharge: 2018-06-06 | Disposition: A | Discharge status: Home / Self Care | Payer: PRIVATE HEALTH INSURANCE | Source: Ambulatory Visit | Attending: Physician Assistant | Admitting: Physician Assistant

## 2018-06-06 DIAGNOSIS — M8580 Other specified disorders of bone density and structure, unspecified site: Secondary | ICD-10-CM

## 2018-07-01 ENCOUNTER — Encounter (HOSPITAL_BASED_OUTPATIENT_CLINIC_OR_DEPARTMENT_OTHER): Payer: Self-pay

## 2018-07-01 ENCOUNTER — Emergency Department (HOSPITAL_BASED_OUTPATIENT_CLINIC_OR_DEPARTMENT_OTHER)
Admission: EM | Admit: 2018-07-01 | Discharge: 2018-07-01 | Disposition: A | Discharge status: Home / Self Care | Payer: 59 | Attending: Emergency Medicine | Admitting: Emergency Medicine

## 2018-07-01 ENCOUNTER — Emergency Department (HOSPITAL_BASED_OUTPATIENT_CLINIC_OR_DEPARTMENT_OTHER): Payer: 59

## 2018-07-01 DIAGNOSIS — J069 Acute upper respiratory infection, unspecified: Secondary | ICD-10-CM | POA: Diagnosis not present

## 2018-07-01 DIAGNOSIS — R05 Cough: Secondary | ICD-10-CM | POA: Diagnosis present

## 2018-07-01 DIAGNOSIS — Z79899 Other long term (current) drug therapy: Secondary | ICD-10-CM | POA: Insufficient documentation

## 2018-07-01 DIAGNOSIS — B9789 Other viral agents as the cause of diseases classified elsewhere: Secondary | ICD-10-CM

## 2018-07-01 NOTE — ED Triage Notes (Addendum)
Per caregiver pt c/o cough x2weeks

## 2018-07-01 NOTE — ED Provider Notes (Signed)
[facility] EMERGENCY DEPARTMENT Provider Note   CSN: [id number] Arrival date & time: 07/01/18  0935     History   Chief Complaint Chief Complaint  Patient presents with  . Cough    HPI Marissa Gray is a 37 y.o. female.  The history is provided by the patient and a caregiver.  Cough  This is a new problem. Episode onset: 2 weeks. The problem occurs hourly. The problem has not changed since onset.The cough is productive of sputum (occasional mucous). There has been no fever. Associated symptoms include rhinorrhea. Pertinent negatives include no chest pain, no chills, no sore throat, no shortness of breath and no wheezing. She has tried nothing for the symptoms. The treatment provided no relief. She is not a smoker. Past medical history comments: History of rheumatoid arthritis on methotrexate, Down syndrome and thyroid disease.Marland Kitchen    Past Medical History:  Diagnosis Date  . 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 Closed bicondylar fracture of right tibial plateau 06/20/2017  . 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 Down's syndrome   . GERD (gastroesophageal reflux disease)   . Rheumatoid arthritis (HCC)   . Strabismus     Patient Active Problem List   Diagnosis Date Noted  . Hashimoto's thyroiditis 09/12/2017  . Nodular thyroid disease 08/03/2017  . Chronic pain of left knee 07/04/2017  . Moderate asthma without complication 06/28/2017  . Hypokalemia 06/28/2017  . Constipation 06/28/2017  . 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 Gastroesophageal reflux disease without esophagitis   . 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 Down's syndrome   . Rheumatoid arthritis involving multiple sites (HCC)   . Closed bicondylar fracture of right tibial plateau 06/20/2017    Past Surgical History:  Procedure Laterality Date  . CARDIAC SURGERY    . FOOT ARTHRODESIS, TRIPLE    . ORIF TIBIA PLATEAU Right 06/22/2017     Procedure: OPEN REDUCTION INTERNAL FIXATION (ORIF) TIBIAL PLATEAU;  Surgeon: Myrene Galas, MD;  Location: [facility] OR;  Service: Orthopedics;  Laterality: Right;     OB History   None      Home Medications    Prior to Admission medications   Medication Sig Start Date End Date Taking? Authorizing Provider  acetaminophen (TYLENOL) 500 MG tablet Take 1-2 tablets (500-1,000 mg total) by mouth every 6 (six) hours as needed for mild pain or moderate pain. 06/25/17   Montez Morita, PA-C  beclomethasone (QVAR) 40 MCG/ACT inhaler Inhale 1 puff into the lungs 2 (two) times daily.    [provider]  docusate sodium (COLACE) 100 MG capsule Take 1 capsule (100 mg total) by mouth 2 (two) times daily. 06/25/17   Montez Morita, PA-C  esomeprazole (NEXIUM) 40 MG packet Take 40 mg by mouth daily at 12 noon.    [provider]  Eyelid Cleansers (OCUSOFT EYELID CLEANSING) PADS Apply to Eyelid and eyelash topically two times a day for Dry eyes    [provider]  folic acid (FOLVITE) 1 MG tablet Take 2 mg by mouth daily.    [provider]  ketotifen (ZADITOR) 0.025 % ophthalmic solution Place 1 drop into both eyes 2 (two) times daily.    [provider]  levonorgestrel-ethinyl estradiol (VIENVA) 0.1-20 MG-MCG tablet Take 1 tablet by mouth daily.    [provider]  levothyroxine (SYNTHROID, LEVOTHROID) 25 MCG tablet Give 1.5 (37.5 mcg) tablet by mouth in the morning    [provider]  methocarbamol (ROBAXIN) 500 MG tablet Take 1-2 tablets (500-1,000 mg total) by mouth every 6 (six) hours as needed for muscle spasms. 06/25/17   Montez Morita, PA-C  methotrexate (RHEUMATREX) 10 MG tablet Take 7.5-10 mg by mouth once a week. On 7.5 mg on Thursday mornings and 10 mg on Thursday evenings    [provider]  montelukast (SINGULAIR) 10 MG tablet Take 10 mg by mouth every evening.    [provider]  Multiple Vitamin (MULTIVITAMIN WITH MINERALS)  TABS tablet Take 1 tablet by mouth daily.    [provider]  mupirocin ointment (BACTROBAN) 2 % Apply 1 application topically daily.    [provider]  nabumetone (RELAFEN) 500 MG tablet Take 500 mg by mouth 2 (two) times daily.    [provider]  potassium chloride (MICRO-K) 10 MEQ CR capsule Take 10 mEq by mouth 2 (two) times daily.    [provider]  PSYLLIUM PO Give 1 capsule by mouth one time daily    [provider]  sodium chloride (OCEAN FOR KIDS) 0.65 % nasal spray Place 1 spray into the nose 2 (two) times daily.     [provider]  traMADol (ULTRAM) 50 MG tablet Give 0.5 tablet by mouth every 8 hours as needed for breakthrough pain for pain not relieved by Tylenol 09/13/17   Sharee Holster, NP    Family History No family history on file.  Social History Social History   Tobacco Use  . Smoking status: Never Smoker  . Smokeless tobacco: Never Used  Substance Use Topics  . Alcohol use: No  . Drug use: No     Allergies   Ceclor [cefaclor]; Penicillins; and Sulfa antibiotics   Review of Systems Review of Systems  Constitutional: Negative for chills.  HENT: Positive for rhinorrhea. Negative for sore throat.   Respiratory: Positive for cough. Negative for shortness of breath and wheezing.   Cardiovascular: Negative for chest pain.  All other systems reviewed and are negative.    Physical Exam Updated Vital Signs BP (!) 165/77 (BP Location: Right Arm)   Pulse 74   Temp 98.1 F (36.7 C)   Resp 18   Wt 86.2 kg (190 lb)   SpO2 100%   BMI 45.81 kg/m   Physical Exam  Constitutional: She is oriented to person, place, and time. She appears well-developed and well-nourished. No distress.  Obese  HENT:  Head: Normocephalic and atraumatic.  Right Ear: Tympanic membrane normal.  Left Ear: Tympanic membrane normal.  Mouth/Throat: Oropharynx is clear and moist.  Eyes: Pupils are equal, round, and reactive to  light. Conjunctivae and EOM are normal.  Neck: Normal range of motion. Neck supple.  Cardiovascular: Normal rate, regular rhythm and intact distal pulses.  No murmur heard. Pulmonary/Chest: Effort normal and breath sounds normal. No respiratory distress. She has no wheezes. She has no rales.  Abdominal: Soft. She exhibits no distension. There is no tenderness. There is no rebound and no guarding.  Musculoskeletal: Normal range of motion. She exhibits no edema or tenderness.  Neurological: She is alert and oriented to person, place, and time.  Skin: Skin is warm and dry. No rash noted. No erythema.  Psychiatric: She has a normal mood and affect. Her behavior is normal.  Nursing note and vitals reviewed.    ED Treatments / Results  Labs (all labs ordered are listed, but only abnormal results are displayed) Labs Reviewed - No data to display  EKG None  Radiology Dg Chest 2 View  Result Date: 07/01/2018 CLINICAL DATA:  Cough and congestion for 2 weeks EXAM: CHEST - 2 VIEW COMPARISON:  October 19, 2016 FINDINGS: Focal kyphosis in the lower thoracic spine, unchanged since 2017. The heart, hila, mediastinum, lungs, and pleura are unremarkable. IMPRESSION: No active cardiopulmonary disease. Electronically Signed   By: Gerome Sam III M.D   On: 07/01/2018 10:31    Procedures Procedures (including critical care time)  Medications Ordered in ED Medications - No data to display   Initial Impression / Assessment and Plan / ED Course  I have reviewed the triage vital signs and the nursing notes.  Pertinent labs & imaging results that were available during my care of the patient were reviewed by me and considered in my medical decision making (see chart for details).     Pt with symptoms consistent with viral URI.  Well appearing here.  No signs of breathing difficulty  No signs of pharyngitis, otitis or abnormal abdominal findings.  Sick contacts where she lives. CXR wnl and pt to  return with any further problems.   Final Clinical Impressions(s) / ED Diagnoses   Final diagnoses:  Viral URI with cough    ED Discharge Orders    None       Gwyneth Sprout, MD 07/01/18 1041

## 2018-07-01 NOTE — ED Notes (Signed)
NAD at this time. Pt is stable and going home.  

## 2018-08-03 ENCOUNTER — Ambulatory Visit
Admission: RE | Admit: 2018-08-03 | Discharge: 2018-08-03 | Disposition: A | Discharge status: Home / Self Care | Payer: PRIVATE HEALTH INSURANCE | Source: Ambulatory Visit | Attending: Internal Medicine | Admitting: Internal Medicine

## 2018-08-03 DIAGNOSIS — E042 Nontoxic multinodular goiter: Secondary | ICD-10-CM

## 2018-08-15 ENCOUNTER — Emergency Department (HOSPITAL_BASED_OUTPATIENT_CLINIC_OR_DEPARTMENT_OTHER)
Admission: EM | Admit: 2018-08-15 | Discharge: 2018-08-15 | Disposition: A | Discharge status: Home / Self Care | Payer: 59 | Attending: Emergency Medicine | Admitting: Emergency Medicine

## 2018-08-15 ENCOUNTER — Encounter (HOSPITAL_BASED_OUTPATIENT_CLINIC_OR_DEPARTMENT_OTHER): Payer: Self-pay | Admitting: Emergency Medicine

## 2018-08-15 ENCOUNTER — Other Ambulatory Visit: Payer: Self-pay

## 2018-08-15 DIAGNOSIS — M069 Rheumatoid arthritis, unspecified: Secondary | ICD-10-CM | POA: Insufficient documentation

## 2018-08-15 DIAGNOSIS — Z79899 Other long term (current) drug therapy: Secondary | ICD-10-CM | POA: Insufficient documentation

## 2018-08-15 DIAGNOSIS — Q909 Down syndrome, unspecified: Secondary | ICD-10-CM | POA: Insufficient documentation

## 2018-08-15 DIAGNOSIS — L259 Unspecified contact dermatitis, unspecified cause: Secondary | ICD-10-CM | POA: Diagnosis not present

## 2018-08-15 DIAGNOSIS — R21 Rash and other nonspecific skin eruption: Secondary | ICD-10-CM | POA: Diagnosis present

## 2018-08-15 MED ORDER — TRIAMCINOLONE ACETONIDE 0.1 % EX CREA: 1.0000 "application " | TOPICAL_CREAM | Freq: Two times a day (BID) | CUTANEOUS | 0 refills | Status: DC

## 2018-08-15 MED FILL — TRIAMCINOLONE 0.1% CREAM: 0.1 | 15 days supply | Qty: 30 | Fill #0

## 2018-08-15 NOTE — ED Triage Notes (Signed)
Pt c/o rash to RUE and neck since Saturday when she was at camp

## 2018-08-15 NOTE — ED Notes (Signed)
Pt/family verbalized understanding of discharge instructions.   

## 2018-08-15 NOTE — ED Provider Notes (Signed)
[facility] EMERGENCY DEPARTMENT Provider Note   CSN: [id number] Arrival date & time: 08/15/18  9983     History   Chief Complaint Chief Complaint  Patient presents with  . Rash    HPI Marissa Gray is a 37 y.o. female.  Patient is a 37 year old female who presents with a rash.  She was recently at a summer camp and came home on Saturday.  She had a 2-day history of itchy rash to the back of her neck and on her right arm.  She has no facial swelling.  No shortness of breath.  No difficulty swallowing.  No other areas of rash.  She does say that one person in her cabin had bedbugs.  She denies any known new exposures.     Past Medical History:  Diagnosis Date  . 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 Closed bicondylar fracture of right tibial plateau 06/20/2017  . 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 Down's syndrome   . GERD (gastroesophageal reflux disease)   . Rheumatoid arthritis (HCC)   . Strabismus     Patient Active Problem List   Diagnosis Date Noted  . Hashimoto's thyroiditis 09/12/2017  . Nodular thyroid disease 08/03/2017  . Chronic pain of left knee 07/04/2017  . Moderate asthma without complication 06/28/2017  . Hypokalemia 06/28/2017  . Constipation 06/28/2017  . 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 Gastroesophageal reflux disease without esophagitis   . 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 Down's syndrome   . Rheumatoid arthritis involving multiple sites (HCC)   . Closed bicondylar fracture of right tibial plateau 06/20/2017    Past Surgical History:  Procedure Laterality Date  . CARDIAC SURGERY    . FOOT ARTHRODESIS, TRIPLE    . ORIF TIBIA PLATEAU Right 06/22/2017   Procedure: OPEN REDUCTION INTERNAL FIXATION (ORIF) TIBIAL PLATEAU;  Surgeon: Myrene Galas, MD;  Location: [facility] OR;  Service: Orthopedics;  Laterality: Right;     OB History   None      Home Medications      Prior to Admission medications   Medication Sig Start Date End Date Taking? Authorizing Provider  acetaminophen (TYLENOL) 500 MG tablet Take 1-2 tablets (500-1,000 mg total) by mouth every 6 (six) hours as needed for mild pain or moderate pain. 06/25/17   Montez Morita, PA-C  beclomethasone (QVAR) 40 MCG/ACT inhaler Inhale 1 puff into the lungs 2 (two) times daily.    [provider]  docusate sodium (COLACE) 100 MG capsule Take 1 capsule (100 mg total) by mouth 2 (two) times daily. 06/25/17   Montez Morita, PA-C  esomeprazole (NEXIUM) 40 MG packet Take 40 mg by mouth daily at 12 noon.    [provider]  Eyelid Cleansers (OCUSOFT EYELID CLEANSING) PADS Apply to Eyelid and eyelash topically two times a day for Dry eyes    [provider]  folic acid (FOLVITE) 1 MG tablet Take 2 mg by mouth daily.    [provider]  ketotifen (ZADITOR) 0.025 % ophthalmic solution Place 1 drop into both eyes 2 (two) times daily.    [provider]  levonorgestrel-ethinyl estradiol (VIENVA) 0.1-20 MG-MCG tablet Take 1 tablet by mouth daily.    [provider]  levothyroxine (SYNTHROID, LEVOTHROID) 25 MCG tablet Give 1.5 (37.5 mcg) tablet by mouth in the morning    [provider]  methocarbamol (ROBAXIN) 500 MG tablet Take 1-2 tablets (500-1,000 mg total) by mouth every  6 (six) hours as needed for muscle spasms. 06/25/17   Montez Morita, PA-C  methotrexate (RHEUMATREX) 10 MG tablet Take 7.5-10 mg by mouth once a week. On 7.5 mg on Thursday mornings and 10 mg on Thursday evenings    [provider]  montelukast (SINGULAIR) 10 MG tablet Take 10 mg by mouth every evening.    [provider]  Multiple Vitamin (MULTIVITAMIN WITH MINERALS) TABS tablet Take 1 tablet by mouth daily.    [provider]  mupirocin ointment (BACTROBAN) 2 % Apply 1 application topically daily.    [provider]  nabumetone (RELAFEN) 500 MG tablet Take  500 mg by mouth 2 (two) times daily.    [provider]  potassium chloride (MICRO-K) 10 MEQ CR capsule Take 10 mEq by mouth 2 (two) times daily.    [provider]  PSYLLIUM PO Give 1 capsule by mouth one time daily    [provider]  sodium chloride (OCEAN FOR KIDS) 0.65 % nasal spray Place 1 spray into the nose 2 (two) times daily.     [provider]  traMADol (ULTRAM) 50 MG tablet Give 0.5 tablet by mouth every 8 hours as needed for breakthrough pain for pain not relieved by Tylenol 09/13/17   Sharee Holster, NP  triamcinolone cream (KENALOG) 0.1 % Apply 1 application topically 2 (two) times daily. 08/15/18   Rolan Bucco, MD    Family History No family history on file.  Social History Social History   Tobacco Use  . Smoking status: Never Smoker  . Smokeless tobacco: Never Used  Substance Use Topics  . Alcohol use: No  . Drug use: No     Allergies   Ceclor [cefaclor]; Penicillins; and Sulfa antibiotics   Review of Systems Review of Systems  Constitutional: Negative for chills, diaphoresis, fatigue and fever.  HENT: Negative for congestion, rhinorrhea and sneezing.   Eyes: Negative.   Respiratory: Negative for cough, chest tightness and shortness of breath.   Cardiovascular: Negative for chest pain and leg swelling.  Gastrointestinal: Negative for abdominal pain, blood in stool, diarrhea, nausea and vomiting.  Genitourinary: Negative for difficulty urinating, flank pain, frequency and hematuria.  Musculoskeletal: Negative for arthralgias and back pain.  Skin: Positive for rash.  Neurological: Negative for dizziness, speech difficulty, weakness, numbness and headaches.     Physical Exam Updated Vital Signs BP (!) 129/117   Pulse 64   Temp 97.8 F (36.6 C) (Oral)   Resp 16   Ht 4\' 4"  (1.321 m)   Wt 86.2 kg   SpO2 98%   BMI 49.40 kg/m   Physical Exam  Constitutional: She is oriented to person, place, and time. She appears  well-developed and well-nourished.  HENT:  Head: Normocephalic and atraumatic.  Eyes: Pupils are equal, round, and reactive to light.  Neck: Normal range of motion. Neck supple.  Cardiovascular: Normal rate, regular rhythm and normal heart sounds.  Pulmonary/Chest: Effort normal and breath sounds normal. No respiratory distress. She has no wheezes. She has no rales. She exhibits no tenderness.  Abdominal: Soft. Bowel sounds are normal. There is no tenderness. There is no rebound and no guarding.  Musculoskeletal: Normal range of motion. She exhibits no edema.  Lymphadenopathy:    She has no cervical adenopathy.  Neurological: She is alert and oriented to person, place, and time.  Skin: Skin is warm and dry. Rash noted.  Patient has a slightly raised maculopapular type rash to the base of the  neck and splotches on her right arm.  There is no other rash identified.  It is blanching.  There is no petechiae or purpura.  No vesicles.  Psychiatric: She has a normal mood and affect.     ED Treatments / Results  Labs (all labs ordered are listed, but only abnormal results are displayed) Labs Reviewed - No data to display  EKG None  Radiology No results found.  Procedures Procedures (including critical care time)  Medications Ordered in ED Medications - No data to display   Initial Impression / Assessment and Plan / ED Course  I have reviewed the triage vital signs and the nursing notes.  Pertinent labs & imaging results that were available during my care of the patient were reviewed by me and considered in my medical decision making (see chart for details).     Patient is a 37 year old female who presents with a rash.  It looks like a localized contact dermatitis.  There is no suggestions of scabies.  There is no angioedema.  No systemic symptoms.  I will prescribe her triamcinolone cream.  I did advise her to make sure that all of her bedding and clothes were washed and really hot  water with a hot dryer given that she had an exposure to bedbugs.  She was advised to follow-up with her PCP if her symptoms are not improving or return here as needed for any worsening symptoms.  Final Clinical Impressions(s) / ED Diagnoses   Final diagnoses:  Contact dermatitis, unspecified contact dermatitis type, unspecified trigger    ED Discharge Orders         Ordered    triamcinolone cream (KENALOG) 0.1 %  2 times daily     08/15/18 1136           Rolan Bucco, MD 08/15/18 1139

## 2019-10-06 ENCOUNTER — Other Ambulatory Visit: Payer: Self-pay

## 2019-10-06 DIAGNOSIS — Z20822 Contact with and (suspected) exposure to covid-19: Secondary | ICD-10-CM

## 2019-10-09 LAB — NOVEL CORONAVIRUS, NAA: SARS-CoV-2, NAA: NOT DETECTED

## 2021-04-08 NOTE — Progress Notes (Signed)
DUE TO COVID-19 ONLY ONE VISITOR IS ALLOWED TO COME WITH YOU AND STAY IN THE WAITING ROOM ONLY DURING PRE OP AND PROCEDURE DAY OF SURGERY. THE 1 VISITOR  MAY VISIT WITH YOU AFTER SURGERY IN YOUR PRIVATE ROOM DURING VISITING HOURS ONLY!  YOU NEED TO HAVE A COVID 19 TEST ON___4/25/2022 ____ @_______ , THIS TEST MUST BE DONE BEFORE SURGERY,  COVID TESTING SITE [address] [place] , IT IS ON THE RIGHT GOING OUT [address] APPROXIMATELY  2 MINUTES PAST [employer] ON THE RIGHT. ONCE YOUR COVID TEST IS COMPLETED,  PLEASE BEGIN THE QUARANTINE INSTRUCTIONS AS OUTLINED IN YOUR HANDOUT.                Marissa Gray  04/08/2021   Your procedure is scheduled on:                  04/23/2021   Report to [facility] Main  Entrance   Report to admitting at       115 pm   Call this number if you have problems the morning of surgery [phone]    REMEMBER: NO  SOLID FOOD CANDY OR GUM AFTER MIDNIGHT. CLEAR LIQUIDS UNTIL 1245pm         . NOTHING BY MOUTH EXCEPT CLEAR LIQUIDS UNTIL      1245pm  . PLEASE FINISH ENSURE DRINK PER SURGEON ORDER  WHICH NEEDS TO BE COMPLETED AT    1245pm      .      CLEAR LIQUID DIET   Foods Allowed                                                                    Coffee and tea, regular and decaf                            Fruit ices (not with fruit pulp)                                      Iced Popsicles                                    Carbonated beverages, regular and diet                                    Cranberry, grape and apple juices Sports drinks like Gatorade Lightly seasoned clear broth or consume(fat free) Sugar, honey syrup ___________________________________________________________________      BRUSH YOUR TEETH MORNING OF SURGERY AND RINSE YOUR MOUTH OUT, NO CHEWING GUM CANDY OR MINTS.     Take these medicines the morning of surgery with A SIP OF WATER:  Inhalers as usual and bring, nexium, eye drdops  as usual, Synthorid, Claritin if needed  DO NOT TAKE ANY DIABETIC MEDICATIONS DAY OF YOUR SURGERY  You may not have any metal on your body including hair pins and              piercings  Do not wear jewelry, make-up, lotions, powders or perfumes, deodorant             Do not wear nail polish on your fingernails.  Do not shave  48 hours prior to surgery.              Men may shave face and neck.   Do not bring valuables to the hospital. [place].  Contacts, dentures or bridgework may not be worn into surgery.  Leave suitcase in the car. After surgery it may be brought to your room.     Patients discharged the day of surgery will not be allowed to drive home. IF YOU ARE HAVING SURGERY AND GOING HOME THE SAME DAY, YOU MUST HAVE AN ADULT TO DRIVE YOU HOME AND BE WITH YOU FOR 24 HOURS. YOU MAY GO HOME BY TAXI OR UBER OR ORTHERWISE, BUT AN ADULT MUST ACCOMPANY YOU HOME AND STAY WITH YOU FOR 24 HOURS.  Name and phone number of your driver:  Special Instructions: N/A              Please read over the following fact sheets you were given: _____________________________________________________________________  [facility] - Preparing for Surgery Before surgery, you can play an important role.  Because skin is not sterile, your skin needs to be as free of germs as possible.  You can reduce the number of germs on your skin by washing with CHG (chlorahexidine gluconate) soap before surgery.  CHG is an antiseptic cleaner which kills germs and bonds with the skin to continue killing germs even after washing. Please DO NOT use if you have an allergy to CHG or antibacterial soaps.  If your skin becomes reddened/irritated stop using the CHG and inform your nurse when you arrive at Short Stay. Do not shave (including legs and underarms) for at least 48 hours prior to the first CHG shower.  You may shave your face/neck. Please follow  these instructions carefully:  1.  Shower with CHG Soap the night before surgery and the  morning of Surgery.  2.  If you choose to wash your hair, wash your hair first as usual with your  normal  shampoo.  3.  After you shampoo, rinse your hair and body thoroughly to remove the  shampoo.                           4.  Use CHG as you would any other liquid soap.  You can apply chg directly  to the skin and wash                       Gently with a scrungie or clean washcloth.  5.  Apply the CHG Soap to your body ONLY FROM THE NECK DOWN.   Do not use on face/ open                           Wound or open sores. Avoid contact with eyes, ears mouth and genitals (private parts).  Wash face,  Genitals (private parts) with your normal soap.             6.  Wash thoroughly, paying special attention to the area where your surgery  will be performed.  7.  Thoroughly rinse your body with warm water from the neck down.  8.  DO NOT shower/wash with your normal soap after using and rinsing off  the CHG Soap.                9.  Pat yourself dry with a clean towel.            10.  Wear clean pajamas.            11.  Place clean sheets on your bed the night of your first shower and do not  sleep with pets. Day of Surgery : Do not apply any lotions/deodorants the morning of surgery.  Please wear clean clothes to the hospital/surgery center.  FAILURE TO FOLLOW THESE INSTRUCTIONS MAY RESULT IN THE CANCELLATION OF YOUR SURGERY PATIENT SIGNATURE_________________________________  NURSE SIGNATURE__________________________________  ________________________________________________________________________

## 2021-04-14 ENCOUNTER — Encounter (HOSPITAL_COMMUNITY): Payer: Self-pay

## 2021-04-14 ENCOUNTER — Encounter (HOSPITAL_COMMUNITY)
Admission: RE | Admit: 2021-04-14 | Discharge: 2021-04-14 | Disposition: A | Discharge status: Home / Self Care | Payer: Medicaid Other | Source: Ambulatory Visit | Attending: Orthopedic Surgery | Admitting: Orthopedic Surgery

## 2021-04-14 ENCOUNTER — Other Ambulatory Visit: Payer: Self-pay

## 2021-04-14 DIAGNOSIS — Z01812 Encounter for preprocedural laboratory examination: Secondary | ICD-10-CM | POA: Diagnosis not present

## 2021-04-14 HISTORY — DX: Pneumonia, unspecified organism: J18.9

## 2021-04-14 HISTORY — DX: Unspecified asthma, uncomplicated: J45.909

## 2021-04-14 HISTORY — DX: Spinal instabilities, occipito-atlanto-axial region: M53.2X1

## 2021-04-14 HISTORY — DX: Dermatitis, unspecified: L30.9

## 2021-04-14 HISTORY — DX: Hypothyroidism, unspecified: E03.9

## 2021-04-14 HISTORY — DX: Autoimmune thyroiditis: E06.3

## 2021-04-14 HISTORY — DX: Cardiac murmur, unspecified: R01.1

## 2021-04-14 LAB — COMPREHENSIVE METABOLIC PANEL
ALT: 18 U/L (ref 0–44)
AST: 21 U/L (ref 15–41)
Albumin: 3.6 g/dL (ref 3.5–5.0)
Alkaline Phosphatase: 73 U/L (ref 38–126)
Anion gap: 11 (ref 5–15)
BUN: 18 mg/dL (ref 6–20)
CO2: 22 mmol/L (ref 22–32)
Calcium: 9 mg/dL (ref 8.9–10.3)
Chloride: 110 mmol/L (ref 98–111)
Creatinine, Ser: 0.7 mg/dL (ref 0.44–1.00)
GFR, Estimated: >60 mL/min (ref >60)
Glucose, Bld: 85 mg/dL (ref 70–99)
Potassium: 4.4 mmol/L (ref 3.5–5.1)
Sodium: 143 mmol/L (ref 135–145)
Total Bilirubin: 0.4 mg/dL (ref 0.3–1.2)
Total Protein: 8 g/dL (ref 6.5–8.1)

## 2021-04-14 LAB — SURGICAL PCR SCREEN
MRSA, PCR: NEGATIVE
Staphylococcus aureus: NEGATIVE

## 2021-04-14 LAB — APTT: aPTT: 29 seconds (ref 24–36)

## 2021-04-14 LAB — CBC
HCT: 40.3 % (ref 36.0–46.0)
Hemoglobin: 13.6 g/dL (ref 12.0–15.0)
MCH: 37.1 pg — ABNORMAL HIGH (ref 26.0–34.0)
MCHC: 33.7 g/dL (ref 30.0–36.0)
MCV: 109.8 fL — ABNORMAL HIGH (ref 80.0–100.0)
Platelets: 395 10*3/uL (ref 150–400)
RBC: 3.67 MIL/uL — ABNORMAL LOW (ref 3.87–5.11)
RDW: 14.3 % (ref 11.5–15.5)
WBC: 5.4 10*3/uL (ref 4.0–10.5)
nRBC: 0 % (ref 0.0–0.2)

## 2021-04-14 LAB — PROTIME-INR
INR: 1 (ref 0.8–1.2)
Prothrombin Time: 13.3 seconds (ref 11.4–15.2)

## 2021-04-14 NOTE — Progress Notes (Addendum)
Anesthesia Review:  PCP: DR Merri Brunette- [place] 04/07/21 on chart  Requested most recent EKG , echo and LOV note on 04/17/21.   Cardiologist : Chest x-ray : EKG : have requested copy of EKG from DR Merri Brunette on 04/14/21  Echo : Stress test: Cardiac Cath :  Activity level: can do a flight of stairs slowly per pt  Sleep Study/ CPAP : no  Fasting Blood Sugar :      / Checks Blood Sugar -- times a day:   Blood Thinner/ Instructions /Last Dose: ASA / Instructions/ Last Dose :  Pt has Down's syndorme.  LIves in group home.  Pt is alert andoriented x 3 Mother with her at group home.  Pt is aware of medical hx and all current meds.  Able to answer all questions appropriate.y Pt has Atlanto- axial instabiliity - [facility] on [facility] per mother.   Located on chart is current MAR [facility].

## 2021-04-16 NOTE — H&P (Signed)
TOTAL HIP ADMISSION H&P  Patient is admitted for left total hip arthroplasty.  Subjective:  Chief Complaint: Left hip pain  HPI: Marissa Gray, 40 y.o. female, has a history of pain and functional disability in the left hip due to arthritis and patient has failed non-surgical conservative treatments for greater than 12 weeks to include corticosteriod injections and activity modification. Onset of symptoms was gradual, starting >10 years ago with gradually worsening course since that time. The patient noted no past surgery on the left hip. Patient currently rates pain in the left hip at 9 out of 10 with activity. Patient has night pain, worsening of pain with activity and weight bearing, pain that interfers with activities of daily living and pain with passive range of motion. Patient has evidence of subchondral cysts and joint space narrowing by imaging studies. This condition presents safety issues increasing the risk of falls. There is no current active infection.  Patient Active Problem List   Diagnosis Date Noted  . Hashimoto's thyroiditis 09/12/2017  . Nodular thyroid disease 08/03/2017  . Chronic pain of left knee 07/04/2017  . Moderate asthma without complication 06/28/2017  . Hypokalemia 06/28/2017  . Constipation 06/28/2017  . 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 Gastroesophageal reflux disease without esophagitis   . 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 Down's syndrome   . Rheumatoid arthritis involving multiple sites (HCC)   . Closed bicondylar fracture of right tibial plateau 06/20/2017    Past Medical History:  Diagnosis Date  . Asthma   . Atlanto-axial instability    mild per mother   . 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 Closed bicondylar fracture of right tibial plateau 06/20/2017  . 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 Dermatitis    left leg   . Down's syndrome   . GERD (gastroesophageal reflux  disease)   . Hashimoto's disease   . Heart murmur   . Hypothyroidism   . Pneumonia    hx of years ago   . Rheumatoid arthritis (HCC)   . Strabismus     Past Surgical History:  Procedure Laterality Date  . CARDIAC SURGERY     ASD and vSD repair   . FOOT ARTHRODESIS, TRIPLE     bilateral   . ORIF TIBIA PLATEAU Right 06/22/2017   Procedure: OPEN REDUCTION INTERNAL FIXATION (ORIF) TIBIAL PLATEAU;  Surgeon: Myrene Galas, MD;  Location: [facility] OR;  Service: Orthopedics;  Laterality: Right;  . STRABISMUS SURGERY    . ulcer surgery       Prior to Admission medications   Medication Sig Start Date End Date Taking? Authorizing Provider  acetaminophen (TYLENOL) 500 MG tablet Take 1-2 tablets (500-1,000 mg total) by mouth every 6 (six) hours as needed for mild pain or moderate pain. Patient taking differently: Take 1,000 mg by mouth 3 (three) times daily as needed for mild pain or moderate pain. 06/25/17  Yes Montez Morita, PA-C  albuterol (VENTOLIN HFA) 108 (90 Base) MCG/ACT inhaler Inhale 2 puffs into the lungs every 6 (six) hours as needed for wheezing or shortness of breath.   Yes [provider]  azithromycin (ZITHROMAX) 500 MG tablet Take 500 mg by mouth See admin instructions. Take 1 tablet (500 mg) by mouth 1 hour prior to dental procedure   Yes [provider]  calcium carbonate (OSCAL) 1500 (600 Ca) MG TABS tablet Take 600 mg of elemental calcium by mouth daily. (0800)   Yes [provider]  Cholecalciferol (VITAMIN D3) 50 MCG (2000 UT) TABS Take 2,000 Units by mouth in the morning. (0700)   Yes [provider]  clotrimazole-betamethasone (LOTRISONE) cream Apply 1 application topically 2 (two) times daily.   Yes [provider]  diclofenac Sodium (VOLTAREN) 1 % GEL Apply 4 g topically 4 (four) times daily. Apply topically scheduled 4 times daily (0700, 1200, 1700 & 2100) & apply 4 grams to both knees 4 times daily as needed for knee pain   Yes  [provider]  docusate sodium (COLACE) 100 MG capsule Take 1 capsule (100 mg total) by mouth 2 (two) times daily. Patient taking differently: Take 100 mg by mouth 2 (two) times daily. (0700 & 2000) 06/25/17  Yes Montez Morita, PA-C  esomeprazole (NEXIUM) 40 MG capsule Take 40 mg by mouth in the morning. (0700)   Yes [provider]  Eyelid Cleansers (OCUSOFT EYELID CLEANSING) PADS Place 1 application into both eyes in the morning and at bedtime. Apply to Eyelid and eyelash topically two times a day for Dry eyes   Yes [provider]  fluticasone (FLOVENT HFA) 110 MCG/ACT inhaler Inhale 1 puff into the lungs 2 (two) times daily. (0700 & 2000)   Yes [provider]  folic acid (FOLVITE) 1 MG tablet Take 3 mg by mouth in the morning. (0700)   Yes [provider]  ketotifen (ZADITOR) 0.025 % ophthalmic solution Place 1 drop into both eyes 2 (two) times daily. (0700 & 2000)   Yes [provider]  L-Methylfolate 15 MG TABS Take 15 mg by mouth in the morning. (0700)   Yes [provider]  levonorgestrel-ethinyl estradiol (ALESSE) 0.1-20 MG-MCG tablet Take 1 tablet by mouth in the morning. (0700)   Yes [provider]  levothyroxine (SYNTHROID) 75 MCG tablet Take 75 mcg by mouth daily at 6 (six) AM. (0630)   Yes [provider]  loratadine (CLARITIN) 10 MG tablet Take 10 mg by mouth daily as needed for allergies.   Yes [provider]  methotrexate (RHEUMATREX) 10 MG tablet Take 7.5-10 mg by mouth See admin instructions. Take 3 tablets (7.5 mg) by mouth in the morning on Thursdays (0700) & take 4 tablets (10 mg) by mouth in th evening on Thursdays (1700).   Yes [provider]  montelukast (SINGULAIR) 10 MG tablet Take 10 mg by mouth every evening. (1700)   Yes [provider]  Multiple Vitamin (MULTIVITAMIN WITH MINERALS) TABS tablet Take 1 tablet by mouth daily. (0700)   Yes [provider]   mupirocin ointment (BACTROBAN) 2 % Apply 1 application topically in the morning, at noon, and at bedtime. (0800, 1600 & 2000)   Yes [provider]  naproxen (NAPROSYN) 500 MG tablet Take 500 mg by mouth 2 (two) times daily with a meal. Take 1 tablet (500 mg) by mouth scheduled twice daily (0800 & 1800) & take 1 tablet (500 mg) by mouth every 12 hours if needed for pain.   Yes [provider]  potassium chloride (MICRO-K) 10 MEQ CR capsule Take 10 mEq by mouth 2 (two) times daily. (0700 & 2000)   Yes [provider]  pseudoephedrine (SUDAFED) 30 MG tablet Take 30 mg by mouth every 4 (four) hours as needed for congestion.   Yes [provider]  Psyllium (REGULOID) 400 MG CAPS Take 400 mg by mouth in the morning. (0700)   Yes [provider]  sodium chloride (OCEAN) 0.65 % nasal spray Place 1  spray into the nose 2 (two) times daily. (0700 & 2000)   Yes [provider]    Allergies  Allergen Reactions  . Ceclor [Cefaclor] Hives  . Penicillins Hives     Has patient had a PCN reaction causing immediate rash, facial/tongue/throat swelling, SOB or lightheadedness with hypotension: Unknown Has patient had a PCN reaction causing severe rash involving mucus membranes or skin necrosis: Unknown Has patient had a PCN reaction that required hospitalization: Unknown Has patient had a PCN reaction occurring within the last 10 years: Unknown If all of the above answers are "NO", then may proceed with Cephalosporin use.  . Sulfa Antibiotics Hives and Other (See Comments)    FEVER    Social History   Socioeconomic History  . Marital status: Single    Spouse name: Not on file  . Number of children: Not on file  . Years of education: Not on file  . Highest education level: Not on file  Occupational History  . Not on file  Tobacco Use  . Smoking status: Never Smoker  . Smokeless tobacco: Never Used  Vaping Use  . Vaping Use: Never used  Substance  and Sexual Activity  . Alcohol use: No  . Drug use: No  . Sexual activity: Not on file  Other Topics Concern  . Not on file  Social History Narrative  . Not on file   Social Determinants of Health   Financial Resource Strain: Not on file  Food Insecurity: Not on file  Transportation Needs: Not on file  Physical Activity: Not on file  Stress: Not on file  Social Connections: Not on file  Intimate Partner Violence: Not on file    Tobacco Use: Low Risk   . Smoking Tobacco Use: Never Smoker  . Smokeless Tobacco Use: Never Used   Social History   Substance and Sexual Activity  Alcohol Use No    No family history on file.  Review of Systems  Constitutional: Negative for chills and fever.  HENT: Negative for congestion, sore throat and tinnitus.   Eyes: Negative for double vision, photophobia and pain.  Respiratory: Negative for cough, shortness of breath and wheezing.   Cardiovascular: Negative for chest pain, palpitations and orthopnea.  Gastrointestinal: Negative for heartburn, nausea and vomiting.  Genitourinary: Negative for dysuria, frequency and urgency.  Musculoskeletal: Positive for joint pain.  Neurological: Negative for dizziness, weakness and headaches.     Objective:  Physical Exam: Well nourished and well developed.  General: Alert and oriented x3, cooperative and pleasant, no acute distress.  Head: normocephalic, atraumatic, neck supple.  Eyes: EOMI.  Respiratory: breath sounds clear in all fields, no wheezing, rales, or rhonchi. Cardiovascular: Regular rate and rhythm, no murmurs, gallops or rubs.  Abdomen: non-tender to palpation and soft, normoactive bowel sounds. Musculoskeletal:  Left Hip Exam:  Range of motion: She has barely any motion in her left hip. I can flex her to approximately 90 degrees. She has no rotation or abduction.  There is no tenderness over the greater trochanteric bursa.  There is no pain on provocative testing of the hip.    Calves soft and nontender. Motor function intact in LE. Strength 5/5 LE bilaterally. Neuro: Distal pulses 2+. Sensation to light touch intact in LE.  Imaging Review Plain radiographs demonstrate severe degenerative joint disease of the left hip. The bone quality appears to be adequate for age and reported activity level.  Assessment/Plan:  End stage arthritis, left hip  The patient history, physical  examination, clinical judgement of the provider and imaging studies are consistent with end stage degenerative joint disease of the left hip and total hip arthroplasty is deemed medically necessary. The treatment options including medical management, injection therapy, arthroscopy and arthroplasty were discussed at length. The risks and benefits of total hip arthroplasty were presented and reviewed. The risks due to aseptic loosening, infection, stiffness, dislocation/subluxation, thromboembolic complications and other imponderables were discussed. The patient acknowledged the explanation, agreed to proceed with the plan and consent was signed. Patient is being admitted for inpatient treatment for surgery, pain control, PT, OT, prophylactic antibiotics, VTE prophylaxis, progressive ambulation and ADLs and discharge planning.The patient is planning to be discharged to SNF.  Anticipated LOS equal to or greater than 2 midnights due to - Age 64 and older with one or more of the following:  - Obesity  - Expected need for hospital services (PT, OT, Nursing) required for safe  discharge  - Anticipated need for postoperative skilled nursing care or inpatient rehab  - Active co-morbidities: None OR   - Unanticipated findings during/Post Surgery: None  - Patient is a high risk of re-admission due to: None  Therapy Plans: SNF Disposition: SNF ([facility]) Planned DVT Prophylaxis: Xarelto 10 mg QD DME Needed: None PCP: Merri Brunette, MD (clearance received) TXA: IV Allergies: PCN  (rash), ceclor (rash), sulfa Anesthesia Concerns: None Last HgbA1c: Not diabetic.   Other:  - Hx gastric ulcers - Mother staying overnight with patient  - Patient was instructed on what medications to stop prior to surgery. - Follow-up visit in 2 weeks with Dr. Lequita Halt - Begin physical therapy following surgery - Pre-operative lab work as pre-surgical testing - Prescriptions will be provided in hospital at time of discharge  Arther Abbott, PA-C Orthopedic Surgery [facility] [place]

## 2021-04-17 ENCOUNTER — Encounter (HOSPITAL_COMMUNITY): Payer: Self-pay

## 2021-04-17 NOTE — Progress Notes (Signed)
Anesthesia Chart Review:   Case: [id number] Date/Time: 04/23/21 1530   Procedure: TOTAL HIP ARTHROPLASTY-Posterior (Left Hip) -   Anesthesia type: Choice   Pre-op diagnosis: left hip osteoarthritis   Location: [facility] ROOM 09 / [facility]   Surgeons: Ollen Gross, MD      DISCUSSION: Pt is 40 years old with hx Down syndrome, RA, asthma, hypothyroidism, heart murmur (office note from PCP 04/07/21 documents "no murmurs" on cardiac exam)  Pt has atlantoaxial instability. Difficulty airway due to neck instability and large tongue. Glidescope used for previous anesthesia 06/22/17   VS: BP 134/84   Pulse 71   Temp 37.2 C (Oral)   Resp 16   Ht 4\' 6"  (1.372 m)   SpO2 100%   BMI 45.81 kg/m   PROVIDERS: - PCP is , MD who cleared pt for surgery at low risk   LABS: Labs reviewed: Acceptable for surgery. (all labs ordered are listed, but only abnormal results are displayed)  Labs Reviewed  CBC - Abnormal; Notable for the following components:      Result Value   RBC 3.67 (*)    MCV 109.8 (*)    MCH 37.1 (*)    All other components within normal limits  SURGICAL PCR SCREEN  COMPREHENSIVE METABOLIC PANEL  PROTIME-INR  APTT  TYPE AND SCREEN     EKG 04/07/21: NSR.    CV: none  Past Medical History:  Diagnosis Date  . Asthma   . Atlanto-axial instability    mild per mother   . 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 Closed bicondylar fracture of right tibial plateau 06/20/2017  . 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 Dermatitis    left leg   . Down's syndrome   . GERD (gastroesophageal reflux disease)   . Hashimoto's disease   . Heart murmur   . Hypothyroidism   . Pneumonia    hx of years ago   . Rheumatoid arthritis (HCC)   . Strabismus     Past Surgical History:  Procedure Laterality Date  . CARDIAC SURGERY     ASD and vSD repair   . FOOT ARTHRODESIS, TRIPLE     bilateral   . ORIF TIBIA PLATEAU Right 06/22/2017    Procedure: OPEN REDUCTION INTERNAL FIXATION (ORIF) TIBIAL PLATEAU;  Surgeon: 06/24/2017, MD;  Location: [facility] OR;  Service: Orthopedics;  Laterality: Right;  . STRABISMUS SURGERY    . ulcer surgery       MEDICATIONS: . acetaminophen (TYLENOL) 500 MG tablet  . albuterol (VENTOLIN HFA) 108 (90 Base) MCG/ACT inhaler  . azithromycin (ZITHROMAX) 500 MG tablet  . calcium carbonate (OSCAL) 1500 (600 Ca) MG TABS tablet  . Cholecalciferol (VITAMIN D3) 50 MCG (2000 UT) TABS  . clotrimazole-betamethasone (LOTRISONE) cream  . diclofenac Sodium (VOLTAREN) 1 % GEL  . docusate sodium (COLACE) 100 MG capsule  . esomeprazole (NEXIUM) 40 MG capsule  . Eyelid Cleansers (OCUSOFT EYELID CLEANSING) PADS  . fluticasone (FLOVENT HFA) 110 MCG/ACT inhaler  . folic acid (FOLVITE) 1 MG tablet  . ketotifen (ZADITOR) 0.025 % ophthalmic solution  . L-Methylfolate 15 MG TABS  . levonorgestrel-ethinyl estradiol (ALESSE) 0.1-20 MG-MCG tablet  . levothyroxine (SYNTHROID) 75 MCG tablet  . loratadine (CLARITIN) 10 MG tablet  . methotrexate (RHEUMATREX) 10 MG tablet  . montelukast (SINGULAIR) 10 MG tablet  . Multiple Vitamin (MULTIVITAMIN WITH MINERALS) TABS tablet  . mupirocin ointment (BACTROBAN) 2 %  . naproxen (NAPROSYN) 500 MG tablet  .  potassium chloride (MICRO-K) 10 MEQ CR capsule  . pseudoephedrine (SUDAFED) 30 MG tablet  . Psyllium (REGULOID) 400 MG CAPS  . sodium chloride (OCEAN) 0.65 % nasal spray   No current facility-administered medications for this encounter.    If no changes, I anticipate pt can proceed with surgery as scheduled.   Rica Mast, PhD, FNP-BC [facility]/Anesthesiology Phone: [phone] 04/18/2021 3:59 PM

## 2021-04-18 NOTE — Anesthesia Preprocedure Evaluation (Addendum)
Anesthesia Evaluation  Patient identified by MRN, date of birth, ID band Patient awake    Reviewed: Allergy & Precautions, NPO status , Patient's Chart, lab work & pertinent test results  Airway Mallampati: IV  TM Distance: >3 FB Neck ROM: Full    Dental  (+) Teeth Intact, Dental Advisory Given   Pulmonary asthma , pneumonia,    Pulmonary exam normal breath sounds clear to auscultation       Cardiovascular + Valvular Problems/Murmurs  Rhythm:Regular Rate:Normal     Neuro/Psych negative neurological ROS     GI/Hepatic Neg liver ROS, GERD  ,  Endo/Other  Hypothyroidism Morbid obesity  Renal/GU negative Renal ROS     Musculoskeletal  (+) Arthritis ,   Abdominal (+) + obese,   Peds  Hematology negative hematology ROS (+)   Anesthesia Other Findings   Reproductive/Obstetrics                            Anesthesia Physical Anesthesia Plan  ASA: III  Anesthesia Plan: General   Post-op Pain Management:    Induction: Intravenous  PONV Risk Score and Plan: 4 or greater and Ondansetron, Dexamethasone, Midazolam, Treatment may vary due to age or medical condition and Diphenhydramine  Airway Management Planned: Oral ETT and Video Laryngoscope Planned  Additional Equipment: None  Intra-op Plan:   Post-operative Plan: Extubation in OR  Informed Consent: I have reviewed the patients History and Physical, chart, labs and discussed the procedure including the risks, benefits and alternatives for the proposed anesthesia with the patient or authorized representative who has indicated his/her understanding and acceptance.     Dental advisory given  Plan Discussed with: CRNA  Anesthesia Plan Comments: (See APP note by Joslyn Hy, FNP )      Anesthesia Quick Evaluation

## 2021-04-21 ENCOUNTER — Other Ambulatory Visit (HOSPITAL_COMMUNITY)
Admission: RE | Admit: 2021-04-21 | Discharge: 2021-04-21 | Disposition: A | Discharge status: Home / Self Care | Payer: Medicaid Other | Source: Ambulatory Visit | Attending: Orthopedic Surgery | Admitting: Orthopedic Surgery

## 2021-04-21 DIAGNOSIS — Z01812 Encounter for preprocedural laboratory examination: Secondary | ICD-10-CM | POA: Insufficient documentation

## 2021-04-21 DIAGNOSIS — Z20822 Contact with and (suspected) exposure to covid-19: Secondary | ICD-10-CM | POA: Insufficient documentation

## 2021-04-22 LAB — SARS CORONAVIRUS 2 (TAT 6-24 HRS): SARS Coronavirus 2: NEGATIVE

## 2021-04-23 ENCOUNTER — Inpatient Hospital Stay (HOSPITAL_COMMUNITY): Payer: Medicaid Other

## 2021-04-23 ENCOUNTER — Other Ambulatory Visit: Payer: Self-pay

## 2021-04-23 ENCOUNTER — Encounter (HOSPITAL_COMMUNITY): Payer: Self-pay | Admitting: Orthopedic Surgery

## 2021-04-23 ENCOUNTER — Inpatient Hospital Stay (HOSPITAL_COMMUNITY): Payer: Medicaid Other | Admitting: Certified Registered Nurse Anesthetist

## 2021-04-23 ENCOUNTER — Inpatient Hospital Stay (HOSPITAL_COMMUNITY)
Admission: RE | Admit: 2021-04-23 | Discharge: 2021-05-02 | DRG: 470 | Disposition: A | Discharge status: Skilled Nursing Facility | Payer: Medicaid Other | Attending: Orthopedic Surgery | Admitting: Orthopedic Surgery

## 2021-04-23 ENCOUNTER — Encounter (HOSPITAL_COMMUNITY)
Admission: RE | Disposition: A | Discharge status: Skilled Nursing Facility | Payer: Self-pay | Source: Ambulatory Visit | Attending: Orthopedic Surgery

## 2021-04-23 ENCOUNTER — Inpatient Hospital Stay (HOSPITAL_COMMUNITY): Payer: Medicaid Other | Admitting: Emergency Medicine

## 2021-04-23 DIAGNOSIS — K219 Gastro-esophageal reflux disease without esophagitis: Secondary | ICD-10-CM | POA: Diagnosis present

## 2021-04-23 DIAGNOSIS — Z79890 Hormone replacement therapy (postmenopausal): Secondary | ICD-10-CM | POA: Diagnosis not present

## 2021-04-23 DIAGNOSIS — Z6841 Body Mass Index (BMI) 40.0 and over, adult: Secondary | ICD-10-CM | POA: Diagnosis not present

## 2021-04-23 DIAGNOSIS — M069 Rheumatoid arthritis, unspecified: Secondary | ICD-10-CM | POA: Diagnosis present

## 2021-04-23 DIAGNOSIS — M1612 Unilateral primary osteoarthritis, left hip: Principal | ICD-10-CM | POA: Diagnosis present

## 2021-04-23 DIAGNOSIS — Z20822 Contact with and (suspected) exposure to covid-19: Secondary | ICD-10-CM | POA: Diagnosis present

## 2021-04-23 DIAGNOSIS — Z981 Arthrodesis status: Secondary | ICD-10-CM | POA: Diagnosis not present

## 2021-04-23 DIAGNOSIS — M549 Dorsalgia, unspecified: Secondary | ICD-10-CM | POA: Diagnosis present

## 2021-04-23 DIAGNOSIS — Q909 Down syndrome, unspecified: Secondary | ICD-10-CM | POA: Diagnosis not present

## 2021-04-23 DIAGNOSIS — E669 Obesity, unspecified: Secondary | ICD-10-CM | POA: Diagnosis present

## 2021-04-23 DIAGNOSIS — Z8774 Personal history of (corrected) congenital malformations of heart and circulatory system: Secondary | ICD-10-CM

## 2021-04-23 DIAGNOSIS — Z79899 Other long term (current) drug therapy: Secondary | ICD-10-CM | POA: Diagnosis not present

## 2021-04-23 DIAGNOSIS — J45909 Unspecified asthma, uncomplicated: Secondary | ICD-10-CM | POA: Diagnosis present

## 2021-04-23 DIAGNOSIS — E063 Autoimmune thyroiditis: Secondary | ICD-10-CM | POA: Diagnosis present

## 2021-04-23 DIAGNOSIS — M169 Osteoarthritis of hip, unspecified: Secondary | ICD-10-CM

## 2021-04-23 DIAGNOSIS — Z96649 Presence of unspecified artificial hip joint: Secondary | ICD-10-CM

## 2021-04-23 HISTORY — PX: TOTAL HIP ARTHROPLASTY: SHX124

## 2021-04-23 LAB — TYPE AND SCREEN
ABO/RH(D): O POS
Antibody Screen: NEGATIVE

## 2021-04-23 LAB — PREGNANCY, URINE: Preg Test, Ur: NEGATIVE

## 2021-04-23 SURGERY — ARTHROPLASTY, HIP, TOTAL,POSTERIOR APPROACH
Anesthesia: General | Site: Hip | Laterality: Left

## 2021-04-23 MED ORDER — LEVOTHYROXINE SODIUM 75 MCG PO TABS
75.0000 ug | ORAL_TABLET | Freq: Every day | ORAL | Status: DC
  Administered 2021-04-24 – 2021-05-02 (×9): 75 ug via ORAL
  Filled 2021-04-23 (×9): qty 1

## 2021-04-23 MED ORDER — METHOCARBAMOL 500 MG PO TABS
500.0000 mg | ORAL_TABLET | Freq: Four times a day (QID) | ORAL | Status: DC | PRN
  Administered 2021-04-23 – 2021-04-27 (×5): 500 mg via ORAL
  Filled 2021-04-23 (×5): qty 1

## 2021-04-23 MED ORDER — STERILE WATER FOR IRRIGATION IR SOLN
Status: DC | PRN
  Administered 2021-04-23: 2000 mL

## 2021-04-23 MED ORDER — DEXAMETHASONE SODIUM PHOSPHATE 10 MG/ML IJ SOLN
10.0000 mg | Freq: Once | INTRAMUSCULAR | Status: AC
  Administered 2021-04-24: 10 mg via INTRAVENOUS
  Filled 2021-04-23: qty 1

## 2021-04-23 MED ORDER — LIDOCAINE 2% (20 MG/ML) 5 ML SYRINGE
INTRAMUSCULAR | Status: AC
  Filled 2021-04-23: qty 5

## 2021-04-23 MED ORDER — VANCOMYCIN HCL IN DEXTROSE 1-5 GM/200ML-% IV SOLN
1000.0000 mg | INTRAVENOUS | Status: AC
  Administered 2021-04-23: 1000 mg via INTRAVENOUS
  Filled 2021-04-23: qty 200

## 2021-04-23 MED ORDER — SUCCINYLCHOLINE CHLORIDE 200 MG/10ML IV SOSY
PREFILLED_SYRINGE | INTRAVENOUS | Status: AC
  Filled 2021-04-23: qty 10

## 2021-04-23 MED ORDER — LACTATED RINGERS IV SOLN: INTRAVENOUS | Status: DC | PRN

## 2021-04-23 MED ORDER — VANCOMYCIN HCL 1000 MG/200ML IV SOLN
1000.0000 mg | Freq: Two times a day (BID) | INTRAVENOUS | Status: AC
  Administered 2021-04-24: 1000 mg via INTRAVENOUS
  Filled 2021-04-23: qty 200

## 2021-04-23 MED ORDER — ROCURONIUM BROMIDE 10 MG/ML (PF) SYRINGE
PREFILLED_SYRINGE | INTRAVENOUS | Status: AC
  Filled 2021-04-23: qty 10

## 2021-04-23 MED ORDER — METOCLOPRAMIDE HCL 5 MG/ML IJ SOLN
5.0000 mg | Freq: Three times a day (TID) | INTRAMUSCULAR | Status: DC | PRN
Start: 2021-04-23 — End: 2021-05-02

## 2021-04-23 MED ORDER — LACTATED RINGERS IV SOLN: INTRAVENOUS | Status: DC

## 2021-04-23 MED ORDER — SODIUM CHLORIDE 0.9 % IV SOLN: INTRAVENOUS | Status: DC

## 2021-04-23 MED ORDER — HYDROCODONE-ACETAMINOPHEN 5-325 MG PO TABS
1.0000 | ORAL_TABLET | ORAL | Status: DC | PRN
  Administered 2021-04-24 – 2021-04-28 (×3): 1 via ORAL
  Filled 2021-04-23 (×3): qty 1

## 2021-04-23 MED ORDER — ACETAMINOPHEN 325 MG PO TABS
325.0000 mg | ORAL_TABLET | Freq: Four times a day (QID) | ORAL | Status: DC | PRN
  Administered 2021-04-24 – 2021-05-01 (×13): 650 mg via ORAL
  Filled 2021-04-23 (×13): qty 2

## 2021-04-23 MED ORDER — MIDAZOLAM HCL 2 MG/2ML IJ SOLN
INTRAMUSCULAR | Status: AC
  Filled 2021-04-23: qty 2

## 2021-04-23 MED ORDER — BUPIVACAINE HCL (PF) 0.25 % IJ SOLN
INTRAMUSCULAR | Status: AC
  Filled 2021-04-23: qty 30

## 2021-04-23 MED ORDER — FENTANYL CITRATE (PF) 250 MCG/5ML IJ SOLN
INTRAMUSCULAR | Status: AC
  Filled 2021-04-23: qty 5

## 2021-04-23 MED ORDER — CHLORHEXIDINE GLUCONATE 0.12 % MT SOLN
15.0000 mL | Freq: Once | OROMUCOSAL | Status: AC
  Administered 2021-04-23: 15 mL via OROMUCOSAL

## 2021-04-23 MED ORDER — 0.9 % SODIUM CHLORIDE (POUR BTL) OPTIME
TOPICAL | Status: DC | PRN
  Administered 2021-04-23: 1000 mL

## 2021-04-23 MED ORDER — PHENYLEPHRINE HCL (PRESSORS) 10 MG/ML IV SOLN
INTRAVENOUS | Status: AC
  Filled 2021-04-23: qty 1

## 2021-04-23 MED ORDER — BUPIVACAINE-EPINEPHRINE (PF) 0.25% -1:200000 IJ SOLN
INTRAMUSCULAR | Status: DC | PRN
  Administered 2021-04-23: 30 mL

## 2021-04-23 MED ORDER — MAGNESIUM CITRATE PO SOLN: 1.0000 | Freq: Once | ORAL | Status: DC | PRN

## 2021-04-23 MED ORDER — TRAMADOL HCL 50 MG PO TABS
50.0000 mg | ORAL_TABLET | Freq: Four times a day (QID) | ORAL | Status: DC | PRN
  Administered 2021-04-24 – 2021-04-26 (×5): 50 mg via ORAL
  Administered 2021-04-26: 100 mg via ORAL
  Administered 2021-04-27 – 2021-05-02 (×6): 50 mg via ORAL
  Filled 2021-04-23 (×12): qty 1

## 2021-04-23 MED ORDER — MENTHOL 3 MG MT LOZG: 1.0000 | LOZENGE | OROMUCOSAL | Status: DC | PRN

## 2021-04-23 MED ORDER — VANCOMYCIN HCL IN DEXTROSE 1-5 GM/200ML-% IV SOLN: 1000.0000 mg | INTRAVENOUS | Status: DC

## 2021-04-23 MED ORDER — PHENYLEPHRINE HCL-NACL 10-0.9 MG/250ML-% IV SOLN
INTRAVENOUS | Status: DC | PRN
  Administered 2021-04-23: 50 ug/min via INTRAVENOUS

## 2021-04-23 MED ORDER — POLYETHYLENE GLYCOL 3350 17 G PO PACK
17.0000 g | PACK | Freq: Every day | ORAL | Status: DC | PRN
  Administered 2021-04-27: 17 g via ORAL
  Filled 2021-04-23: qty 1

## 2021-04-23 MED ORDER — ONDANSETRON HCL 4 MG/2ML IJ SOLN
INTRAMUSCULAR | Status: DC | PRN
  Administered 2021-04-23: 4 mg via INTRAVENOUS

## 2021-04-23 MED ORDER — POTASSIUM CHLORIDE CRYS ER 10 MEQ PO TBCR
10.0000 meq | EXTENDED_RELEASE_TABLET | Freq: Two times a day (BID) | ORAL | Status: DC
  Administered 2021-04-23 – 2021-05-02 (×18): 10 meq via ORAL
  Filled 2021-04-23 (×18): qty 1

## 2021-04-23 MED ORDER — DEXAMETHASONE SODIUM PHOSPHATE 10 MG/ML IJ SOLN: 8.0000 mg | Freq: Once | INTRAMUSCULAR | Status: AC

## 2021-04-23 MED ORDER — METOCLOPRAMIDE HCL 5 MG PO TABS
5.0000 mg | ORAL_TABLET | Freq: Three times a day (TID) | ORAL | Status: DC | PRN
Start: 2021-04-23 — End: 2021-05-02

## 2021-04-23 MED ORDER — MIDAZOLAM HCL 5 MG/5ML IJ SOLN
INTRAMUSCULAR | Status: DC | PRN
  Administered 2021-04-23 (×2): 2 mg via INTRAVENOUS

## 2021-04-23 MED ORDER — RIVAROXABAN 10 MG PO TABS
10.0000 mg | ORAL_TABLET | Freq: Every day | ORAL | Status: DC
  Administered 2021-04-24 – 2021-05-02 (×9): 10 mg via ORAL
  Filled 2021-04-23 (×9): qty 1

## 2021-04-23 MED ORDER — ONDANSETRON HCL 4 MG PO TABS: 4.0000 mg | ORAL_TABLET | Freq: Four times a day (QID) | ORAL | Status: DC | PRN

## 2021-04-23 MED ORDER — POVIDONE-IODINE 10 % EX SWAB
2.0000 "application " | Freq: Once | CUTANEOUS | Status: AC
  Administered 2021-04-23: 2 via TOPICAL

## 2021-04-23 MED ORDER — ACETAMINOPHEN 10 MG/ML IV SOLN
1000.0000 mg | Freq: Four times a day (QID) | INTRAVENOUS | Status: DC
  Administered 2021-04-23: 1000 mg via INTRAVENOUS
  Filled 2021-04-23: qty 100

## 2021-04-23 MED ORDER — DEXAMETHASONE SODIUM PHOSPHATE 10 MG/ML IJ SOLN
INTRAMUSCULAR | Status: DC | PRN
  Administered 2021-04-23: 8 mg via INTRAVENOUS

## 2021-04-23 MED ORDER — PANTOPRAZOLE SODIUM 40 MG PO TBEC
40.0000 mg | DELAYED_RELEASE_TABLET | Freq: Every day | ORAL | Status: DC
  Administered 2021-04-24 – 2021-05-02 (×9): 40 mg via ORAL
  Filled 2021-04-23 (×9): qty 1

## 2021-04-23 MED ORDER — PHENOL 1.4 % MT LIQD: 1.0000 | OROMUCOSAL | Status: DC | PRN

## 2021-04-23 MED ORDER — LEVONORGESTREL-ETHINYL ESTRAD 0.1-20 MG-MCG PO TABS
1.0000 | ORAL_TABLET | Freq: Every morning | ORAL | Status: DC
  Administered 2021-04-24 – 2021-05-02 (×3): 1 via ORAL

## 2021-04-23 MED ORDER — SUGAMMADEX SODIUM 200 MG/2ML IV SOLN
INTRAVENOUS | Status: DC | PRN
  Administered 2021-04-23: 200 mg via INTRAVENOUS

## 2021-04-23 MED ORDER — PHENYLEPHRINE 40 MCG/ML (10ML) SYRINGE FOR IV PUSH (FOR BLOOD PRESSURE SUPPORT)
PREFILLED_SYRINGE | INTRAVENOUS | Status: DC | PRN
  Administered 2021-04-23 (×11): 80 ug via INTRAVENOUS

## 2021-04-23 MED ORDER — MORPHINE SULFATE (PF) 2 MG/ML IV SOLN: 0.5000 mg | INTRAVENOUS | Status: DC | PRN

## 2021-04-23 MED ORDER — BUDESONIDE 0.25 MG/2ML IN SUSP: 0.2500 mg | Freq: Two times a day (BID) | RESPIRATORY_TRACT | Status: DC | PRN

## 2021-04-23 MED ORDER — ALBUTEROL SULFATE HFA 108 (90 BASE) MCG/ACT IN AERS: 2.0000 | INHALATION_SPRAY | Freq: Four times a day (QID) | RESPIRATORY_TRACT | Status: DC | PRN

## 2021-04-23 MED ORDER — METHOCARBAMOL 500 MG IVPB - SIMPLE MED
500.0000 mg | Freq: Four times a day (QID) | INTRAVENOUS | Status: DC | PRN
  Filled 2021-04-23: qty 50

## 2021-04-23 MED ORDER — HYDROMORPHONE HCL 1 MG/ML IJ SOLN
0.2500 mg | INTRAMUSCULAR | Status: DC | PRN
  Administered 2021-04-23: 0.25 mg via INTRAVENOUS

## 2021-04-23 MED ORDER — LIDOCAINE 2% (20 MG/ML) 5 ML SYRINGE
INTRAMUSCULAR | Status: DC | PRN
  Administered 2021-04-23: 80 mg via INTRAVENOUS

## 2021-04-23 MED ORDER — ORAL CARE MOUTH RINSE: 15.0000 mL | Freq: Once | OROMUCOSAL | Status: AC

## 2021-04-23 MED ORDER — PHENYLEPHRINE 40 MCG/ML (10ML) SYRINGE FOR IV PUSH (FOR BLOOD PRESSURE SUPPORT)
PREFILLED_SYRINGE | INTRAVENOUS | Status: AC
  Filled 2021-04-23: qty 20

## 2021-04-23 MED ORDER — DOCUSATE SODIUM 100 MG PO CAPS
100.0000 mg | ORAL_CAPSULE | Freq: Two times a day (BID) | ORAL | Status: DC
  Administered 2021-04-23 – 2021-05-02 (×18): 100 mg via ORAL
  Filled 2021-04-23 (×18): qty 1

## 2021-04-23 MED ORDER — BISACODYL 10 MG RE SUPP: 10.0000 mg | Freq: Every day | RECTAL | Status: DC | PRN

## 2021-04-23 MED ORDER — HYDROMORPHONE HCL 1 MG/ML IJ SOLN
INTRAMUSCULAR | Status: AC
  Administered 2021-04-23: 0.25 mg via INTRAVENOUS
  Filled 2021-04-23: qty 1

## 2021-04-23 MED ORDER — ONDANSETRON HCL 4 MG/2ML IJ SOLN: 4.0000 mg | Freq: Four times a day (QID) | INTRAMUSCULAR | Status: DC | PRN

## 2021-04-23 MED ORDER — PROMETHAZINE HCL 25 MG/ML IJ SOLN: 6.2500 mg | INTRAMUSCULAR | Status: DC | PRN

## 2021-04-23 MED ORDER — MEPERIDINE HCL 50 MG/ML IJ SOLN: 6.2500 mg | INTRAMUSCULAR | Status: DC | PRN

## 2021-04-23 MED ORDER — FENTANYL CITRATE (PF) 250 MCG/5ML IJ SOLN
INTRAMUSCULAR | Status: DC | PRN
  Administered 2021-04-23: 25 ug via INTRAVENOUS
  Administered 2021-04-23: 50 ug via INTRAVENOUS
  Administered 2021-04-23: 25 ug via INTRAVENOUS
  Administered 2021-04-23 (×3): 50 ug via INTRAVENOUS

## 2021-04-23 MED ORDER — ROCURONIUM BROMIDE 10 MG/ML (PF) SYRINGE
PREFILLED_SYRINGE | INTRAVENOUS | Status: DC | PRN
  Administered 2021-04-23: 60 mg via INTRAVENOUS

## 2021-04-23 MED ORDER — TRANEXAMIC ACID-NACL 1000-0.7 MG/100ML-% IV SOLN
1000.0000 mg | INTRAVENOUS | Status: AC
  Administered 2021-04-23: 1000 mg via INTRAVENOUS
  Filled 2021-04-23: qty 100

## 2021-04-23 MED ORDER — BUPIVACAINE-EPINEPHRINE (PF) 0.25% -1:200000 IJ SOLN
INTRAMUSCULAR | Status: AC
  Filled 2021-04-23: qty 30

## 2021-04-23 MED ORDER — METHOCARBAMOL 500 MG IVPB - SIMPLE MED
INTRAVENOUS | Status: AC
  Filled 2021-04-23: qty 50

## 2021-04-23 MED ORDER — PROPOFOL 10 MG/ML IV BOLUS
INTRAVENOUS | Status: DC | PRN
  Administered 2021-04-23: 40 mg via INTRAVENOUS
  Administered 2021-04-23: 140 mg via INTRAVENOUS

## 2021-04-23 SURGICAL SUPPLY — 64 items
ACETAB LINER 28 ID 44 OD (Cup) ×2 IMPLANT
BAG DECANTER FOR FLEXI CONT (MISCELLANEOUS) ×2 IMPLANT
BAG SPEC THK2 15X12 ZIP CLS (MISCELLANEOUS)
BAG ZIPLOCK 12X15 (MISCELLANEOUS) IMPLANT
BIT DRILL 2.8X128 (BIT) ×2 IMPLANT
BLADE EXTENDED COATED 6.5IN (ELECTRODE) ×2 IMPLANT
BLADE SAW SAG 73X25 THK (BLADE) ×1
BLADE SAW SGTL 73X25 THK (BLADE) ×1 IMPLANT
BLADE SURG SZ10 CARB STEEL (BLADE) ×4 IMPLANT
COVER SURGICAL LIGHT HANDLE (MISCELLANEOUS) ×2 IMPLANT
COVER WAND RF STERILE (DRAPES) IMPLANT
CUP ACET PINNACLE 44 OD (Hips) ×1 IMPLANT
DECANTER SPIKE VIAL GLASS SM (MISCELLANEOUS) ×2 IMPLANT
DRAPE INCISE IOBAN 66X45 STRL (DRAPES) ×2 IMPLANT
DRAPE ORTHO SPLIT 77X108 STRL (DRAPES) ×4
DRAPE POUCH INSTRU U-SHP 10X18 (DRAPES) ×2 IMPLANT
DRAPE SURG ORHT 6 SPLT 77X108 (DRAPES) ×2 IMPLANT
DRAPE U-SHAPE 47X51 STRL (DRAPES) ×2 IMPLANT
DRESSING MEPILEX FLEX 4X4 (GAUZE/BANDAGES/DRESSINGS) IMPLANT
DRSG AQUACEL AG ADV 3.5X10 (GAUZE/BANDAGES/DRESSINGS) ×2 IMPLANT
DRSG MEPILEX FLEX 4X4 (GAUZE/BANDAGES/DRESSINGS)
DURAPREP 26ML APPLICATOR (WOUND CARE) ×2 IMPLANT
ELECT REM PT RETURN 15FT ADLT (MISCELLANEOUS) ×2 IMPLANT
EVACUATOR 1/8 PVC DRAIN (DRAIN) IMPLANT
FACESHIELD WRAPAROUND (MASK) ×8 IMPLANT
FACESHIELD WRAPAROUND OR TEAM (MASK) ×4 IMPLANT
GAUZE SPONGE 4X4 12PLY STRL (GAUZE/BANDAGES/DRESSINGS) ×2 IMPLANT
GLOVE SRG 8 PF TXTR STRL LF DI (GLOVE) ×1 IMPLANT
GLOVE SURG ENC MOIS LTX SZ6.5 (GLOVE) ×4 IMPLANT
GLOVE SURG ENC MOIS LTX SZ8 (GLOVE) ×4 IMPLANT
GLOVE SURG UNDER POLY LF SZ7 (GLOVE) ×6 IMPLANT
GLOVE SURG UNDER POLY LF SZ8 (GLOVE) ×2
GOWN STRL REUS W/TWL LRG LVL3 (GOWN DISPOSABLE) ×4 IMPLANT
HEAD CERAMIC DELTA 28MM (Hips) ×1 IMPLANT
IMMOBILIZER KNEE 20 (SOFTGOODS) ×2
IMMOBILIZER KNEE 20 THIGH 36 (SOFTGOODS) ×1 IMPLANT
KIT BASIN OR (CUSTOM PROCEDURE TRAY) ×2 IMPLANT
KIT TURNOVER KIT A (KITS) ×2 IMPLANT
LINER ACETAB 28 ID 44 OD (Cup) IMPLANT
MANIFOLD NEPTUNE II (INSTRUMENTS) ×2 IMPLANT
NDL SAFETY ECLIPSE 18X1.5 (NEEDLE) ×2 IMPLANT
NEEDLE HYPO 18GX1.5 SHARP (NEEDLE) ×4
NS IRRIG 1000ML POUR BTL (IV SOLUTION) ×2 IMPLANT
PACK TOTAL JOINT (CUSTOM PROCEDURE TRAY) ×2 IMPLANT
PADDING CAST COTTON 6X4 STRL (CAST SUPPLIES) ×2 IMPLANT
PASSER SUT SWANSON 36MM LOOP (INSTRUMENTS) ×2 IMPLANT
PENCIL SMOKE EVACUATOR (MISCELLANEOUS) IMPLANT
PROTECTOR NERVE ULNAR (MISCELLANEOUS) ×2 IMPLANT
SLEEVE SROM PROXIMAL 55514 (Hips) ×1 IMPLANT
SROM FEMORAL STEM STD 16X11 30 (Hips) ×2 IMPLANT
STEM FEMORAL SROM STD 16X11 30 (Hips) IMPLANT
STRIP CLOSURE SKIN 1/2X4 (GAUZE/BANDAGES/DRESSINGS) ×4 IMPLANT
SUT ETHIBOND NAB CT1 #1 30IN (SUTURE) ×4 IMPLANT
SUT MNCRL AB 4-0 PS2 18 (SUTURE) ×2 IMPLANT
SUT STRATAFIX 0 PDS 27 VIOLET (SUTURE) ×4
SUT VIC AB 2-0 CT1 27 (SUTURE) ×6
SUT VIC AB 2-0 CT1 TAPERPNT 27 (SUTURE) ×3 IMPLANT
SUTURE STRATFX 0 PDS 27 VIOLET (SUTURE) ×1 IMPLANT
SYR 20ML LL LF (SYRINGE) ×2 IMPLANT
SYR 50ML LL SCALE MARK (SYRINGE) IMPLANT
TOWEL OR 17X26 10 PK STRL BLUE (TOWEL DISPOSABLE) ×4 IMPLANT
TOWEL OR NON WOVEN STRL DISP B (DISPOSABLE) ×2 IMPLANT
TRAY FOLEY MTR SLVR 16FR STAT (SET/KITS/TRAYS/PACK) ×2 IMPLANT
WATER STERILE IRR 1000ML POUR (IV SOLUTION) ×2 IMPLANT

## 2021-04-23 NOTE — Anesthesia Procedure Notes (Addendum)
Procedure Name: Intubation Date/Time: 04/23/2021 3:24 PM Performed by: Lorelee Market, CRNA Pre-anesthesia Checklist: Patient identified, Emergency Drugs available, Suction available and Patient being monitored Patient Re-evaluated:Patient Re-evaluated prior to induction Oxygen Delivery Method: Circle system utilized Preoxygenation: Pre-oxygenation with 100% oxygen Induction Type: IV induction Ventilation: Mask ventilation without difficulty Laryngoscope Size: Glidescope and 3 Grade View: Grade I Tube type: Oral Tube size: 6.5 mm Number of attempts: 1 Airway Equipment and Method: Stylet and Oral airway Placement Confirmation: ETT inserted through vocal cords under direct vision,  positive ETCO2 and breath sounds checked- equal and bilateral Secured at: 24 cm Tube secured with: Tape Dental Injury: Teeth and Oropharynx as per pre-operative assessment

## 2021-04-23 NOTE — Interval H&P Note (Signed)
History and Physical Interval Note:  04/23/2021 1:15 PM  Marissa Gray  has presented today for surgery, with the diagnosis of left hip osteoarthritis.  The various methods of treatment have been discussed with the patient and family. After consideration of risks, benefits and other options for treatment, the patient has consented to  Procedure(s) with comments: TOTAL HIP ARTHROPLASTY-Posterior (Left) - as a surgical intervention.  The patient's history has been reviewed, patient examined, no change in status, stable for surgery.  I have reviewed the patient's chart and labs.  Questions were answered to the patient's satisfaction.     Homero Fellers Johne Buckle

## 2021-04-23 NOTE — Op Note (Signed)
Pre-operative diagnosis- Osteoarthritis Left hip  Post-operative diagnosis- Osteoarthritis  Left hip  Procedure-  LeftTotal Hip Arthroplasty  Surgeon- Marissa Gray. Marissa Mazzaferro, MD  Assistant- Nelia Shi, PA-C   Anesthesia  General  EBL- 400 mL   Drain Hemovac   Complication- None  Condition-PACU - hemodynamically stable.   Brief Clinical Note-  Marissa Gray is a 40 y.o. female with end stage arthritis of her left hip with progressively worsening pain and dysfunction. Pain occurs with activity and rest including pain at night. She has tried analgesics, protected weight bearing and rest without benefit. Pain is too severe to attempt physical therapy. Radiographs demonstrate bone on bone arthritis with subchondral cyst formation. She presents now for left THA.  Procedure in detail-   The patient is brought into the operating room and placed on the operating table. After successful administration of General  anesthesia, the patient is placed in the  Right lateral decubitus position with the  Left side up and held in place with the hip positioner. The lower extremity is isolated from the perineum with plastic drapes and time-out is performed by the surgical team. The lower extremity is then prepped and draped in the usual sterile fashion. A short posterolateral incision is made with a ten blade through the subcutaneous tissue to the level of the fascia lata which is incised in line with the skin incision. The sciatic nerve is palpated and protected and the short external rotators and capsule are isolated from the femur. The hip is then dislocated and the center of the femoral head is marked. A trial prosthesis is placed such that the trial head corresponds to the center of the patients' native femoral head. The resection level is marked on the femoral neck and the resection is made with an oscillating saw. The femoral head is removed and femoral retractors placed to gain access to the femoral  canal.      The canal finder is passed into the femoral canal and the canal is thoroughly irrigated with sterile saline to remove the fatty contents. Axial reaming is performed to 11.5  mm, proximal reaming to 16D  and the sleeve machined to a large. A 16D large trial sleeve is placed into the proximal femur.      The femur is then retracted anteriorly to gain acetabular exposure. Acetabular retractors are placed and the labrum and osteophytes are removed, Acetabular reaming is performed to 43  mm and a 44  mm Pinnacle acetabular shell is placed in anatomic position with excellent purchase. Additional dome screws were not needed. The permanent 28 mm neutral + 4 Marathon liner is placed into the acetabular shell.      The trial femur is then placed into the femoral canal. The size is 16 x 11  stem with a 36 + 6  neck and a 28 + 0 head with the neck version 10 degrees less than  the patients' native anteversion. The hip is reduced with excellent stability with full extension and full external rotation, 70 degrees flexion with 40 degrees adduction and 90 degrees internal rotation and 90 degrees of flexion with 70 degrees of internal rotation. The operative leg is placed on top of the non-operative leg and the leg lengths are found to be equal. The trials are then removed and the permanent implant of the same size is impacted into the femoral canal. The ceramic femoral head of the same size as the trial is placed and the hip is reduced with  the same stability parameters. The operative leg is again placed on top of the non-operative leg and the leg lengths are found to be equal.      The wound is then copiously irrigated with saline solution and the capsule and short external rotators are re-attached to the femur through drill holes with Ethibond suture. The fascia lata is closed over a hemovac drain with #1 vicryl suture and the fascia lata, gluteal muscles and subcutaneous tissues are injected with 30 ml of .25%  Marcaine. The subcutaneous tissues are closed with #1 and2-0 vicryl and the subcuticular layer closed with running 4-0 Monocryl. The drain is hooked to suction, incision cleaned and dried, and steri-srips and a bulky sterile dressing applied. The limb is placed into a knee immobilizer and the patient is awakened and transported to recovery in stable condition.      Please note that a surgical assistant was a medical necessity for this procedure in order to perform it in a safe and expeditious manner. The assistant was necessary to provide retraction to the vital neurovascular structures and to retract and position the limb to allow for anatomic placement of the prosthetic components.  Marissa Gray Marissa Szostak, MD    04/23/2021, 5:16 PM

## 2021-04-23 NOTE — Plan of Care (Signed)
Plan of care discussed with Patient and her Mother.

## 2021-04-23 NOTE — Transfer of Care (Signed)
Immediate Anesthesia Transfer of Care Note  Patient: Marissa Gray  Procedure(s) Performed: TOTAL HIP ARTHROPLASTY-Posterior (Left Hip)  Patient Location: PACU  Anesthesia Type:General  Level of Consciousness: awake  Airway & Oxygen Therapy: Patient Spontanous Breathing and Patient connected to face mask oxygen  Post-op Assessment: Report given to RN and Post -op Vital signs reviewed and stable  Post vital signs: Reviewed and stable  Last Vitals:  Vitals Value Taken Time  BP 91/79 04/23/21 1758  Temp    Pulse 106 04/23/21 1759  Resp 14 04/23/21 1759  SpO2 100 % 04/23/21 1759  Vitals shown include unvalidated device data.  Last Pain:  Vitals:   04/23/21 1321  TempSrc:   PainSc: 7       Patients Stated Pain Goal: 3 (04/23/21 1321)  Complications: No complications documented.

## 2021-04-23 NOTE — Discharge Instructions (Addendum)
Dr. Ollen Gross Total Joint Specialist [facility] [address], [address] [place], [place] [address] [phone]  POSTERIOR TOTAL HIP REPLACEMENT POSTOPERATIVE DIRECTIONS  Hip Rehabilitation, Guidelines Following Surgery  The results of a hip operation are greatly improved after range of motion and muscle strengthening exercises. Follow all safety measures which are given to protect your hip. If any of these exercises cause increased pain or swelling in your joint, decrease the amount until you are comfortable again. Then slowly increase the exercises. Call your caregiver if you have problems or questions.   PRECAUTIONS (6 WEEKS FOLLOWING SURGERY) . Do not bend your hip past a 90 degree angle . Do not cross your legs. . Don't twist your hip inwards- keep knees and toes pointed upwards   BLOOD CLOT PREVENTION . Take a 10 mg Xarelto once a day for three weeks following surgery. Then take an 81 mg Aspirin once a day for three weeks. Then discontinue Aspirin. . You may resume your vitamins/supplements once you have discontinued the Xarelto. . Do not take any NSAIDs (Advil, Aleve, Ibuprofen, Meloxicam, etc.) until you have discontinued the Xarelto.   HOME CARE INSTRUCTIONS  . Remove items at home which could result in a fall. This includes throw rugs or furniture in walking pathways.   ICE to the affected hip every three hours for 30 minutes at a time and then as needed for pain and swelling.  Continue to use ice on the hip for pain and swelling from surgery. You may notice swelling that will progress down to the foot and ankle.  This is normal after surgery.  Elevate the leg when you are not up walking on it.    Continue to use the breathing machine which will help keep your temperature down.  It is common for your temperature to cycle up and down following surgery, especially at night when you are not up moving around and exerting yourself.  The breathing machine keeps your lungs  expanded and your temperature down.  DIET You may resume your previous home diet once your are discharged from the hospital.  DRESSING / WOUND CARE / SHOWERING Keep the surgical dressing until follow up.  The dressing is water proof, so you can shower without any extra covering.  IF THE DRESSING FALLS OFF or the wound gets wet inside, change the dressing with sterile gauze.  Please use good hand washing techniques before changing the dressing.  Do not use any lotions or creams on the incision until instructed by your surgeon.   You may start showering once you are discharged home but do not submerge the incision under water. Just pat the incision dry and apply a dry gauze dressing on daily. Change the surgical dressing daily and reapply a dry dressing each time.  ACTIVITY Walk with your walker as instructed. Use walker as long as suggested by your caregivers. Avoid periods of inactivity such as sitting longer than an hour when not asleep. This helps prevent blood clots.  You may resume a sexual relationship in one month or when given the OK by your doctor.  You may return to work once you are cleared by your doctor.  Do not drive a car for 6 weeks or until released by you surgeon.  Do not drive while taking narcotics.  WEIGHT BEARING Weight bearing as tolerated with assist device (walker, cane, etc) as directed, use it as long as suggested by your surgeon or therapist, typically at least 4-6 weeks.  POSTOPERATIVE CONSTIPATION PROTOCOL Constipation - defined medically as fewer than three stools per week and severe constipation as less than one stool per week.  One of the most common issues patients have following surgery is constipation.  Even if you have a regular bowel pattern at home, your normal regimen is likely to be disrupted due to multiple reasons following surgery.  Combination of anesthesia, postoperative narcotics, change in appetite and fluid intake all can affect your bowels.   In order to avoid complications following surgery, here are some recommendations in order to help you during your recovery period.  Colace (docusate) - Pick up an over-the-counter form of Colace or another stool softener and take twice a day as long as you are requiring postoperative pain medications.  Take with a full glass of water daily.  If you experience loose stools or diarrhea, hold the colace until you stool forms back up.  If your symptoms do not get better within 1 week or if they get worse, check with your doctor.  Dulcolax (bisacodyl) - Pick up over-the-counter and take as directed by the product packaging as needed to assist with the movement of your bowels.  Take with a full glass of water.  Use this product as needed if not relieved by Colace only.   MiraLax (polyethylene glycol) - Pick up over-the-counter to have on hand.  MiraLax is a solution that will increase the amount of water in your bowels to assist with bowel movements.  Take as directed and can mix with a glass of water, juice, soda, coffee, or tea.  Take if you go more than two days without a movement. Do not use MiraLax more than once per day. Call your doctor if you are still constipated or irregular after using this medication for 7 days in a row.  If you continue to have problems with postoperative constipation, please contact the office for further assistance and recommendations.  If you experience "the worst abdominal pain ever" or develop nausea or vomiting, please contact the office immediatly for further recommendations for treatment.  ITCHING  If you experience itching with your medications, try taking only a single pain pill, or even half a pain pill at a time.  You can also use Benadryl over the counter for itching or also to help with sleep.   TED HOSE STOCKINGS Wear the elastic stockings on both legs for three weeks following surgery during the day but you may remove then at night for  sleeping.  POST-OPERATIVE OPIOID TAPER INSTRUCTIONS: . It is important to wean off of your opioid medication as soon as possible. If you do not need pain medication after your surgery it is ok to stop day one. Mar[place] Kitchen Opioids include: o Codeine, Hydrocodone(Norco, Vicodin), Oxycodone(Percocet, oxycontin) and hydromorphone amongst others.  . Long term and even short term use of opiods can cause: o Increased pain response o Dependence o Constipation o Depression o Respiratory depression o And more.  . Withdrawal symptoms can include o Flu like symptoms o Nausea, vomiting o And more . Techniques to manage these symptoms o Hydrate well o Eat regular healthy meals o Stay active o Use relaxation techniques(deep breathing, meditating, yoga) . Do Not substitute Alcohol to help with tapering . If you have been on opioids for less than two weeks and do not have pain than it is ok to stop all together.  . Plan to wean off of opioids o This plan should start within one week post op of  your joint replacement. o Maintain the same interval or time between taking each dose and first decrease the dose.  o Cut the total daily intake of opioids by one tablet each day o Next start to increase the time between doses. o The last dose that should be eliminated is the evening dose.      MEDICATIONS See your medication summary on the "After Visit Summary" that the nursing staff will review with you prior to discharge.  You may have some home medications which will be placed on hold until you complete the course of blood thinner medication.  It is important for you to complete the blood thinner medication as prescribed by your surgeon.  Continue your approved medications as instructed at time of discharge.  PRECAUTIONS If you experience chest pain or shortness of breath - call 911 immediately for transfer to the hospital emergency department.  If you develop a fever greater that 101 F, purulent drainage from  wound, increased redness or drainage from wound, foul odor from the wound/dressing, or calf pain - CONTACT YOUR SURGEON.                                                   FOLLOW-UP APPOINTMENTS Make sure you keep all of your appointments after your operation with your surgeon and caregivers. You should call the office at the above phone number and make an appointment for approximately two weeks after the date of your surgery or on the date instructed by your surgeon outlined in the "After Visit Summary".  RANGE OF MOTION AND STRENGTHENING EXERCISES  These exercises are designed to help you keep full movement of your hip joint. Follow your caregiver's or physical therapist's instructions. Perform all exercises about fifteen times, three times per day or as directed. Exercise both hips, even if you have had only one joint replacement. These exercises can be done on a training (exercise) mat, on the floor, on a table or on a bed. Use whatever works the best and is most comfortable for you. Use music or television while you are exercising so that the exercises are a pleasant break in your day. This will make your life better with the exercises acting as a break in routine you can look forward to.  . Lying on your back, slowly slide your foot toward your buttocks, raising your knee up off the floor. Then slowly slide your foot back down until your leg is straight again.  . Lying on your back spread your legs as far apart as you can without causing discomfort.  . Lying on your side, raise your upper leg and foot straight up from the floor as far as is comfortable. Slowly lower the leg and repeat.  . Lying on your back, tighten up the muscle in the front of your thigh (quadriceps muscles). You can do this by keeping your leg straight and trying to raise your heel off the floor. This helps strengthen the largest muscle supporting your knee.  . Lying on your back, tighten up the muscles of your buttocks both with  the legs straight and with the knee bent at a comfortable angle while keeping your heel on the floor.   IF YOU ARE TRANSFERRED TO A SKILLED REHAB FACILITY If the patient is transferred to a skilled rehab facility following release from the hospital,  a list of the current medications will be sent to the facility for the patient to continue.  When discharged from the skilled rehab facility, please have the facility set up the patient's Home Health Physical Therapy prior to being released. Also, the skilled facility will be responsible for providing the patient with their medications at time of release from the facility to include their pain medication, the muscle relaxants, and their blood thinner medication. If the patient is still at the rehab facility at time of the two week follow up appointment, the skilled rehab facility will also need to assist the patient in arranging follow up appointment in our office and any transportation needs.  MAKE SURE YOU:  . Understand these instructions.  . Get help right away if you are not doing well or get worse.    Pick up stool softner and laxative for home use following surgery while on pain medications. Do not submerge incision under water. Please use good hand washing techniques while changing dressing each day. May shower starting three days after surgery. Please use a clean towel to pat the incision dry following showers. Continue to use ice for pain and swelling after surgery. Do not use any lotions or creams on the incision until instructed by your surgeon.   Information on my medicine - XARELTO (Rivaroxaban)  This medication education was reviewed with me or my healthcare representative as part of my discharge preparation.  The pharmacist that spoke with me during my hospital stay was:    Why was Xarelto prescribed for you? Xarelto was prescribed for you to reduce the risk of blood clots forming after orthopedic surgery. The medical term for  these abnormal blood clots is venous thromboembolism (VTE).  What do you need to know about xarelto ? Take your Xarelto ONCE DAILY at the same time every day. You may take it either with or without food.  If you have difficulty swallowing the tablet whole, you may crush it and mix in applesauce just prior to taking your dose.  Take Xarelto exactly as prescribed by your doctor and DO NOT stop taking Xarelto without talking to the doctor who prescribed the medication.  Stopping without other VTE prevention medication to take the place of Xarelto may increase your risk of developing a clot.  After discharge, you should have regular check-up appointments with your healthcare provider that is prescribing your Xarelto.    What do you do if you miss a dose? If you miss a dose, take it as soon as you remember on the same day then continue your regularly scheduled once daily regimen the next day. Do not take two doses of Xarelto on the same day.   Important Safety Information A possible side effect of Xarelto is bleeding. You should call your healthcare provider right away if you experience any of the following: ? Bleeding from an injury or your nose that does not stop. ? Unusual colored urine (red or dark brown) or unusual colored stools (red or black). ? Unusual bruising for unknown reasons. ? A serious fall or if you hit your head (even if there is no bleeding).  Some medicines may interact with Xarelto and might increase your risk of bleeding while on Xarelto. To help avoid this, consult your healthcare provider or pharmacist prior to using any new prescription or non-prescription medications, including herbals, vitamins, non-steroidal anti-inflammatory drugs (NSAIDs) and supplements.  This website has more information on Xarelto: VisitDestination.com.br.

## 2021-04-24 LAB — BASIC METABOLIC PANEL
Anion gap: 8 (ref 5–15)
BUN: 11 mg/dL (ref 6–20)
CO2: 21 mmol/L — ABNORMAL LOW (ref 22–32)
Calcium: 8 mg/dL — ABNORMAL LOW (ref 8.9–10.3)
Chloride: 107 mmol/L (ref 98–111)
Creatinine, Ser: 0.67 mg/dL (ref 0.44–1.00)
GFR, Estimated: >60 mL/min (ref >60)
Glucose, Bld: 117 mg/dL — ABNORMAL HIGH (ref 70–99)
Potassium: 4.3 mmol/L (ref 3.5–5.1)
Sodium: 136 mmol/L (ref 135–145)

## 2021-04-24 LAB — CBC
HCT: 35.6 % — ABNORMAL LOW (ref 36.0–46.0)
Hemoglobin: 11.9 g/dL — ABNORMAL LOW (ref 12.0–15.0)
MCH: 36.5 pg — ABNORMAL HIGH (ref 26.0–34.0)
MCHC: 33.4 g/dL (ref 30.0–36.0)
MCV: 109.2 fL — ABNORMAL HIGH (ref 80.0–100.0)
Platelets: 304 10*3/uL (ref 150–400)
RBC: 3.26 MIL/uL — ABNORMAL LOW (ref 3.87–5.11)
RDW: 13.5 % (ref 11.5–15.5)
WBC: 9 10*3/uL (ref 4.0–10.5)
nRBC: 0 % (ref 0.0–0.2)

## 2021-04-24 MED ORDER — HYDROCODONE-ACETAMINOPHEN 5-325 MG PO TABS: 1.0000 | ORAL_TABLET | Freq: Four times a day (QID) | ORAL | 0 refills | Status: DC | PRN

## 2021-04-24 MED ORDER — TRAMADOL HCL 50 MG PO TABS: 50.0000 mg | ORAL_TABLET | Freq: Four times a day (QID) | ORAL | 0 refills | Status: DC | PRN

## 2021-04-24 MED ORDER — METHOCARBAMOL 500 MG PO TABS: 500.0000 mg | ORAL_TABLET | Freq: Four times a day (QID) | ORAL | 0 refills | Status: DC | PRN

## 2021-04-24 MED ORDER — RIVAROXABAN 10 MG PO TABS: 10.0000 mg | ORAL_TABLET | Freq: Every day | ORAL | 0 refills | Status: DC

## 2021-04-24 NOTE — Care Management (Cosign Needed)
RE: Marissa Gray Date of Birth: [birth year] Date: 04/24/2021 MUST ID: [id number]   To Whom It May Concern:  Please be advised that the above name patient will require a short-term nursing home stay--anticipated 30 days or less rehabilitation and strengthening. The plan is for return home.

## 2021-04-24 NOTE — TOC Initial Note (Signed)
Transition of Care [facility]) - Initial/Assessment Note   Patient Details  Name: Marissa Gray MRN: [id number] Date of Birth: 04/25/1981  Transition of Care [facility]) CM/SW Contact:    Sherie Don, LCSW Phone Number: 04/24/2021, 3:26 PM  Clinical Narrative: Patient is a 40 year old female who was admitted for osteoarthritis of hip. PT and OT evaluations recommended SNF.  CSW met with patient and mother regarding SNF. Mother reported she wants the patient to go to rehab at [facility], but is agreeable to referrals to [facility] in [place] and SNFs in [place].  FL2 completed. Initial referral faxed out to [facility] and [facility] in the hub. [facility] will not have availability until next week.  PASRR is pending as additional information is being requested. 30-day nursing facility stay note awaiting cosign.  Expected Discharge Plan: Skilled Nursing Facility Barriers to Discharge: [place] (PASRR),SNF Pending bed offer  Patient Goals and CMS Choice Patient states their goals for this hospitalization and ongoing recovery are:: Discharge to [facility] for rehab CMS Medicare.gov Compare Post Acute Care list provided to:: Patient Choice offered to / list presented to : [place]  Expected Discharge Plan and Services Expected Discharge Plan: [place] In-house Referral: Clinical Social Work Post Acute Care Choice: [place] Living arrangements for the past 2 months: Group Home Expected Discharge Date: 04/24/21             DME Arranged: [facility] DME Agency: [place] Representative spoke with at DME Agency: Pre-arranged before surgery  Prior Living Arrangements/Services Living arrangements for the past 2 months: Group Home Lives with:: Facility Resident Patient language and need for interpreter reviewed:: Yes Do you feel safe going back to the place where you live?: Yes       Need for Family Participation in Patient Care: Yes (Comment) Care giver support system in place?: Yes (comment) Criminal Activity/Legal Involvement Pertinent to Current Situation/Hospitalization: No - Comment as needed  Activities of Daily Living Home Assistive Devices/Equipment: Engineer, drilling (specify type),Wheelchair,Other (Comment) ADL Screening (condition at time of admission) Patient's cognitive ability adequate to safely complete daily activities?: Yes Is the patient deaf or have difficulty hearing?: No Does the patient have difficulty seeing, even when wearing glasses/contacts?: No Does the patient have difficulty concentrating, remembering, or making decisions?: No Patient able to express need for assistance with ADLs?: Yes Does the patient have difficulty dressing or bathing?: Yes Independently performs ADLs?: Yes (appropriate for developmental age) Does the patient have difficulty walking or climbing stairs?: Yes Weakness of Legs: Left Weakness of Arms/Hands: None  Permission Sought/Granted Permission sought to share information with : Facility Art therapist granted to share information with : Yes, Verbal Permission Granted Permission granted to share info w AGENCY: SNFs  Emotional Assessment Appearance:: Appears stated age Attitude/Demeanor/Rapport: Engaged Affect (typically observed): Accepting Orientation: : Oriented to Self,Oriented to Place,Oriented to  Time,Oriented to Situation Alcohol / Substance Use: Not Applicable Psych Involvement: No (comment)  Admission diagnosis:  Primary osteoarthritis of left hip [M16.12] Patient Active Problem List   Diagnosis Date Noted  . OA (osteoarthritis) of hip 04/23/2021  . Primary osteoarthritis of left hip 04/23/2021  . Hashimoto's thyroiditis 09/12/2017  . Nodular thyroid disease 08/03/2017  . Chronic pain of left knee 07/04/2017  . Moderate asthma without complication 23/55/7322  . Hypokalemia  06/28/2017  . Constipation 06/28/2017  . 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 Gastroesophageal reflux disease without esophagitis   .  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 Down's syndrome   . Rheumatoid arthritis involving multiple sites ([place])   . Closed bicondylar fracture of right tibial plateau 06/20/2017   PCP:  Carol Ada, MD Pharmacy:   [facility] [address] [place] [address] Phone: [phone] Fax: [phone]  Readmission Risk Interventions No flowsheet data found.

## 2021-04-24 NOTE — Evaluation (Signed)
Occupational Therapy Evaluation Patient Details Name: Marissa Gray MRN: [id number] DOB: 1981/02/10 Today's Date: 04/24/2021    History of Present Illness Patient is a 40 year old woman with Down's Syndrome s/p left THA.   Clinical Impression   Marissa Gray presents with decreased ROM and strength of LLE,  posterior hip precautions, obesity and decreased activity tolerance resulting in a decline in functional abilities. Patient able to transfer to side of bed with min guard and ambulate with RW with min assist - needing constant cueing to use walker effectively and maintaining hip precautions. Patient needing significant assistance for LB ADLs due to hip precautions. Patient will benefit from skilled OT services while in hospital to improve deficits and learn compensatory strategies as needed in order to return to PLOF. Recommend short term rehab at discharge to improve deficits and mobility as well as solidfy hip precautions and safety in patient's every day life. Patient is happy, pleasant and motivated.      Follow Up Recommendations  SNF    Equipment Recommendations  None recommended by OT    Recommendations for Other Services       Precautions / Restrictions Precautions Precautions: Posterior Hip Restrictions Weight Bearing Restrictions: Yes LLE Weight Bearing: Weight bearing as tolerated      Mobility Bed Mobility Overal bed mobility: Needs Assistance Bed Mobility: Supine to Sit     Supine to sit: Min guard;HOB elevated          Transfers Overall transfer level: Needs assistance Equipment used: Rolling walker (2 wheeled) Transfers: Sit to/from Stand Sit to Stand: From elevated surface;Min assist         General transfer comment: Min assist for more tactile cues to keep patient maintaining hip precautions. MIn assist for walker management to walk around bed to recliner.    Balance Overall balance assessment: Needs assistance Sitting-balance support: No  upper extremity supported Sitting balance-Leahy Scale: Good     Standing balance support: During functional activity Standing balance-Leahy Scale: Fair Standing balance comment: able take hands off of walker                           ADL either performed or assessed with clinical judgement   ADL Overall ADL's : Needs assistance/impaired Eating/Feeding: Independent   Grooming: Set up;Sitting   Upper Body Bathing: Set up;Sitting   Lower Body Bathing: Maximal assistance;Sit to/from stand   Upper Body Dressing : Set up;Sitting   Lower Body Dressing: Maximal assistance;Sit to/from stand   Toilet Transfer: Minimal assistance;RW;Grab bars;BSC   Toileting- Clothing Manipulation and Hygiene: Maximal assistance;Sit to/from stand       Functional mobility during ADLs: Minimal assistance;Rolling walker       Vision Patient Visual Report: No change from baseline       Perception     Praxis      Pertinent Vitals/Pain Pain Assessment: 0-10 Pain Score: 2  Pain Descriptors / Indicators: Aching Pain Intervention(s): Monitored during session;Premedicated before session     Hand Dominance Right   Extremity/Trunk Assessment Upper Extremity Assessment Upper Extremity Assessment: Overall WFL for tasks assessed   Lower Extremity Assessment Lower Extremity Assessment: Defer to PT evaluation   Cervical / Trunk Assessment Cervical / Trunk Assessment: Normal   Communication Communication Communication: No difficulties   Cognition Arousal/Alertness: Awake/alert Behavior During Therapy: WFL for tasks assessed/performed Overall Cognitive Status: Within Functional Limits for tasks assessed  General Comments: Per prior chart review Mother states has he mental capacity of a 4th grader.   General Comments       Exercises     Shoulder Instructions      Home Living Family/patient expects to be discharged to:: Skilled  nursing facility                                        Prior Functioning/Environment Level of Independence: Needs assistance  Gait / Transfers Assistance Needed: walked with walker ADL's / Homemaking Assistance Needed: needed help with dressing on Left leg. Having assistance with bathing and toileting as well.            OT Problem List: Decreased strength;Decreased range of motion;Decreased activity tolerance;Impaired balance (sitting and/or standing);Pain;Decreased knowledge of precautions;Decreased knowledge of use of DME or AE      OT Treatment/Interventions: Self-care/ADL training;DME and/or AE instruction;Therapeutic activities;Balance training;Patient/family education;Therapeutic exercise    OT Goals(Current goals can be found in the care plan section) Acute Rehab OT Goals Patient Stated Goal: To be more independent in order to return to Group Home OT Goal Formulation: With patient/family Time For Goal Achievement: 05/08/21 Potential to Achieve Goals: Good  OT Frequency: Min 2X/week   Barriers to D/C:            Co-evaluation              AM-PAC OT "6 Clicks" Daily Activity     Outcome Measure Help from another person eating meals?: None Help from another person taking care of personal grooming?: A Little Help from another person toileting, which includes using toliet, bedpan, or urinal?: A Lot Help from another person bathing (including washing, rinsing, drying)?: A Lot Help from another person to put on and taking off regular upper body clothing?: A Little Help from another person to put on and taking off regular lower body clothing?: A Lot 6 Click Score: 16   End of Session Equipment Utilized During Treatment: Rolling walker Nurse Communication: Mobility status  Activity Tolerance: Patient tolerated treatment well Patient left: in chair;with call bell/phone within reach;with family/visitor present;with chair alarm set  OT Visit  Diagnosis: Other abnormalities of gait and mobility (R26.89);Pain                Time: 6767-2094 OT Time Calculation (min): 16 min Charges:  OT General Charges $OT Visit: 1 Visit OT Evaluation $OT Eval Low Complexity: 1 Low  Mauri Tolen, OTR/L Acute Care Rehab Services  Office [phone] Pager: [phone]   Kelli Churn 04/24/2021, 10:59 AM

## 2021-04-24 NOTE — Progress Notes (Signed)
Physical Therapy Treatment Patient Details Name: Marissa Gray MRN: [id number] DOB: 24-Nov-1981 Today's Date: 04/24/2021    History of Present Illness Patient is a 40 year old woman with Down's Syndrome s/p left THA.    PT Comments    Pt requiring increased time for all tasks but very motivated and progressing steadily with mobility.   Follow Up Recommendations  SNF     Equipment Recommendations  Rolling walker with 5" wheels    Recommendations for Other Services       Precautions / Restrictions Precautions Precautions: Posterior Hip Precaution Booklet Issued: Yes (comment) Precaution Comments: Pt recalls 2/3 THP without cues. Restrictions Weight Bearing Restrictions: No LLE Weight Bearing: Weight bearing as tolerated    Mobility  Bed Mobility Overal bed mobility: Needs Assistance Bed Mobility: Sit to Supine       Sit to supine: Min assist   General bed mobility comments: cues for sequence, adherence to THP and use of R LE to self assist    Transfers Overall transfer level: Needs assistance Equipment used: Rolling walker (2 wheeled) Transfers: Sit to/from Stand Sit to Stand: Min assist         General transfer comment: cues for LE management, use of UEs to self assist and adherence to THP; pysical assist to bring wt up and fwd and to balance in initial standing  Ambulation/Gait Ambulation/Gait assistance: Min assist Gait Distance (Feet): 36 Feet Assistive device: Rolling walker (2 wheeled) Gait Pattern/deviations: Step-to pattern;Decreased step length - right;Decreased step length - left;Shuffle;Trunk flexed Gait velocity: decr   General Gait Details: cues for sequence, posture and position from [employer]             Wheelchair Mobility    Modified Rankin (Stroke Patients Only)       Balance Overall balance assessment: Needs assistance Sitting-balance support: No upper extremity supported;Feet supported Sitting balance-Leahy Scale:  Good     Standing balance support: No upper extremity supported Standing balance-Leahy Scale: Fair Standing balance comment: able take hands off of walker                            Cognition Arousal/Alertness: Awake/alert Behavior During Therapy: WFL for tasks assessed/performed Overall Cognitive Status: Within Functional Limits for tasks assessed                                 General Comments: Per prior chart review Mother states has he mental capacity of a 4th grader.      Exercises Total Joint Exercises Ankle Circles/Pumps: AROM;Both;15 reps;Supine Quad Sets: AROM;Both;10 reps;Supine Heel Slides: AAROM;Left;15 reps;Supine Hip ABduction/ADduction: AAROM;Left;15 reps;Supine    General Comments        Pertinent Vitals/Pain Pain Assessment: 0-10 Pain Score: 4  Pain Location: L hip Pain Descriptors / Indicators: Aching;Sore Pain Intervention(s): Limited activity within patient's tolerance;Monitored during session;Premedicated before session;Ice applied    Home Living Family/patient expects to be discharged to:: Skilled nursing facility                    Prior Function Level of Independence: Needs assistance  Gait / Transfers Assistance Needed: walked with walker ADL's / Homemaking Assistance Needed: needed help with dressing on Left leg. Having assistance with bathing and toileting as well.     PT Goals (current goals can now be found in the care plan section)  Acute Rehab PT Goals Patient Stated Goal: To be more independent in order to return to Group Home PT Goal Formulation: With patient Time For Goal Achievement: 05/01/21 Potential to Achieve Goals: Good Progress towards PT goals: Progressing toward goals    Frequency    7X/week      PT Plan Current plan remains appropriate    Co-evaluation              AM-PAC PT "6 Clicks" Mobility   Outcome Measure  Help needed turning from your back to your side while in a  flat bed without using bedrails?: A Lot Help needed moving from lying on your back to sitting on the side of a flat bed without using bedrails?: A Little Help needed moving to and from a bed to a chair (including a wheelchair)?: A Little Help needed standing up from a chair using your arms (e.g., wheelchair or bedside chair)?: A Little Help needed to walk in hospital room?: A Little Help needed climbing 3-5 steps with a railing? : A Little 6 Click Score: 17    End of Session Equipment Utilized During Treatment: Gait belt Activity Tolerance: Patient tolerated treatment well;Patient limited by fatigue Patient left: in chair;with call bell/phone within reach;with family/visitor present Nurse Communication: Mobility status PT Visit Diagnosis: Difficulty in walking, not elsewhere classified (R26.2)     Time: 4315-4008 PT Time Calculation (min) (ACUTE ONLY): 24 min  Charges:  $Gait Training: 8-22 mins $Therapeutic Exercise: 8-22 mins $Therapeutic Activity: 8-22 mins                     Mauro Kaufmann PT Acute Rehabilitation Services Pager [phone] Office [phone]    Srihaan Mastrangelo 04/24/2021, 3:06 PM

## 2021-04-24 NOTE — NC FL2 (Signed)
[place] MEDICAID FL2 LEVEL OF CARE SCREENING TOOL     IDENTIFICATION  Patient Name: Marissa Gray Birthdate: 1981/05/02 Sex: female Admission Date (Current Location): 04/23/2021  [place] and [place] Number:  Haynes Bast [id number] K Facility and Address:  [facility],  [address] [place] [address]      Provider Number: [id number]  Attending Physician Name and Address:  Ollen Gross, MD  Relative Name and Phone Number:  [name] (mother) Ph: [phone]    Current Level of Care: Hospital Recommended Level of Care: Skilled Nursing Facility Prior Approval Number:    Date Approved/Denied:   PASRR Number:    Discharge Plan: SNF    Current Diagnoses: Patient Active Problem List   Diagnosis Date Noted  . OA (osteoarthritis) of hip 04/23/2021  . Primary osteoarthritis of left hip 04/23/2021  . Hashimoto's thyroiditis 09/12/2017  . Nodular thyroid disease 08/03/2017  . Chronic pain of left knee 07/04/2017  . Moderate asthma without complication 06/28/2017  . Hypokalemia 06/28/2017  . Constipation 06/28/2017  . 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 Gastroesophageal reflux disease without esophagitis   . 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 Down's syndrome   . Rheumatoid arthritis involving multiple sites (HCC)   . Closed bicondylar fracture of right tibial plateau 06/20/2017    Orientation RESPIRATION BLADDER Height & Weight     Self,Time,Situation,Place  O2 Continent Weight: 166 lb 3.6 oz (75.4 kg) Height:  4\' 6"  (137.2 cm)  BEHAVIORAL SYMPTOMS/MOOD NEUROLOGICAL BOWEL NUTRITION STATUS      Continent Diet (Regular diet)  AMBULATORY STATUS COMMUNICATION OF NEEDS Skin   Limited Assist Verbally Other (Comment),Surgical wounds (Eczema: right ankle, left foot)                       Personal Care Assistance Level of Assistance  Bathing,Feeding,Dressing Bathing Assistance: Limited assistance Feeding  assistance: Independent Dressing Assistance: Limited assistance     Functional Limitations Info  Sight,Hearing,Speech Sight Info: Adequate Hearing Info: Adequate Speech Info: Adequate    SPECIAL CARE FACTORS FREQUENCY  PT (By licensed PT),OT (By licensed OT)     PT Frequency: 5x's/week OT Frequency: 5x's/week            Contractures Contractures Info: Not present    Additional Factors Info  Code Status,Allergies Code Status Info: Full Allergies Info: Ceclor (Cefaclor), Penicillins, Sulfa Antibiotics           Current Medications (04/24/2021):  This is the current hospital active medication list Current Facility-Administered Medications  Medication Dose Route Frequency Provider Last Rate Last Admin  . 0.9 %  sodium chloride infusion   Intravenous Continuous Edmisten, Kristie L, PA   Stopped at 04/24/21 0526  . acetaminophen (TYLENOL) tablet 325-650 mg  325-650 mg Oral Q6H PRN Edmisten, Kristie L, PA   650 mg at 04/24/21 1400  . albuterol (VENTOLIN HFA) 108 (90 Base) MCG/ACT inhaler 2 puff  2 puff Inhalation Q6H PRN Edmisten, Kristie L, PA      . bisacodyl (DULCOLAX) suppository 10 mg  10 mg Rectal Daily PRN Edmisten, Kristie L, PA      . budesonide (PULMICORT) nebulizer solution 0.25 mg  0.25 mg Nebulization BID PRN Edmisten, Kristie L, PA      . docusate sodium (COLACE) capsule 100 mg  100 mg Oral BID Edmisten, Kristie L, PA   100 mg at 04/24/21 0905  . HYDROcodone-acetaminophen (NORCO/VICODIN) 5-325 MG per tablet 1-2 tablet  1-2 tablet Oral  Q4H PRN Derenda Fennel, PA   1 tablet at 04/24/21 4782  . levonorgestrel-ethinyl estradiol (ALESSE) 0.1-20 MG-MCG per tablet 1 tablet  1 tablet Oral q AM Edmisten, Kristie L, PA   1 tablet at 04/24/21 0659  . levothyroxine (SYNTHROID) tablet 75 mcg  75 mcg Oral Q0600 Edmisten, Kristie L, PA   75 mcg at 04/24/21 0521  . magnesium citrate solution 1 Bottle  1 Bottle Oral Once PRN Edmisten, Kristie L, PA      .  menthol-cetylpyridinium (CEPACOL) lozenge 3 mg  1 lozenge Oral PRN Edmisten, Kristie L, PA       Or  . phenol (CHLORASEPTIC) mouth spray 1 spray  1 spray Mouth/Throat PRN Edmisten, Kristie L, PA      . methocarbamol (ROBAXIN) tablet 500 mg  500 mg Oral Q6H PRN Edmisten, Kristie L, PA   500 mg at 04/24/21 0524   Or  . methocarbamol (ROBAXIN) 500 mg in dextrose 5 % 50 mL IVPB  500 mg Intravenous Q6H PRN Edmisten, Kristie L, PA      . metoCLOPramide (REGLAN) tablet 5-10 mg  5-10 mg Oral Q8H PRN Edmisten, Kristie L, PA       Or  . metoCLOPramide (REGLAN) injection 5-10 mg  5-10 mg Intravenous Q8H PRN Edmisten, Kristie L, PA      . morphine 2 MG/ML injection 0.5-1 mg  0.5-1 mg Intravenous Q2H PRN Edmisten, Kristie L, PA      . ondansetron (ZOFRAN) tablet 4 mg  4 mg Oral Q6H PRN Edmisten, Kristie L, PA       Or  . ondansetron (ZOFRAN) injection 4 mg  4 mg Intravenous Q6H PRN Edmisten, Kristie L, PA      . pantoprazole (PROTONIX) EC tablet 40 mg  40 mg Oral Daily Edmisten, Kristie L, PA   40 mg at 04/24/21 0904  . polyethylene glycol (MIRALAX / GLYCOLAX) packet 17 g  17 g Oral Daily PRN Edmisten, Kristie L, PA      . potassium chloride (KLOR-CON) CR tablet 10 mEq  10 mEq Oral BID Edmisten, Kristie L, PA   10 mEq at 04/24/21 0904  . rivaroxaban (XARELTO) tablet 10 mg  10 mg Oral Q breakfast Edmisten, Kristie L, PA   10 mg at 04/24/21 0904  . traMADol (ULTRAM) tablet 50-100 mg  50-100 mg Oral Q6H PRN Edmisten, Kristie L, PA   50 mg at 04/24/21 1400     Discharge Medications: Please see discharge summary for a list of discharge medications.  Relevant Imaging Results:  Relevant Lab Results:   Additional Information SSN: [id number]  Ewing Schlein, LCSW

## 2021-04-24 NOTE — Progress Notes (Signed)
   Subjective: 1 Day Post-Op Procedure(s) (LRB): TOTAL HIP ARTHROPLASTY-Posterior (Left) Patient reports pain as mild.   Patient seen in rounds by Dr. Lequita Halt. Patient is well, and has had no acute complaints or problems. Denies SOB, chest pain, or calf pain. No acute overnight events. Has been dealing with some back pain, muscle relaxers helping.  We will begin therapy today.   Objective: Vital signs in last 24 hours: Temp:  [97.5 F (36.4 C)-98.4 F (36.9 C)] 98.3 F (36.8 C) (04/28 0600) Pulse Rate:  [82-109] 86 (04/28 0600) Resp:  [12-21] 16 (04/28 0600) BP: (91-146)/(74-106) 123/74 (04/28 0600) SpO2:  [94 %-100 %] 100 % (04/28 0600) Weight:  [75.4 kg-80.7 kg] 75.4 kg (04/27 1932)  Intake/Output from previous day:  Intake/Output Summary (Last 24 hours) at 04/24/2021 0832 Last data filed at 04/24/2021 0600 Gross per 24 hour  Intake 2833.83 ml  Output 400 ml  Net 2433.83 ml     Intake/Output this shift: No intake/output data recorded.  Labs: Recent Labs    04/24/21 0317  HGB 11.9*   Recent Labs    04/24/21 0317  WBC 9.0  RBC 3.26*  HCT 35.6*  PLT 304   Recent Labs    04/24/21 0317  NA 136  K 4.3  CL 107  CO2 21*  BUN 11  CREATININE 0.67  GLUCOSE 117*  CALCIUM 8.0*   No results for input(s): LABPT, INR in the last 72 hours.  Exam: General - Patient is Alert and Oriented Extremity - Neurologically intact Neurovascular intact Intact pulses distally Dorsiflexion/Plantar flexion intact Dressing - dressing C/D/I Motor Function - intact, moving foot and toes well on exam.   Past Medical History:  Diagnosis Date  . Asthma   . 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 Closed bicondylar fracture of right tibial plateau 06/20/2017  . 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 Dermatitis    left leg   . Down's syndrome   . GERD (gastroesophageal reflux disease)   . Hashimoto's disease   . Heart murmur   . Hypothyroidism   .  Pneumonia    hx of years ago   . Rheumatoid arthritis (HCC)   . Strabismus     Assessment/Plan: 1 Day Post-Op Procedure(s) (LRB): TOTAL HIP ARTHROPLASTY-Posterior (Left) Principal Problem:   OA (osteoarthritis) of hip Active Problems:   Primary osteoarthritis of left hip  Estimated body mass index is 40.08 kg/m as calculated from the following:   Height as of this encounter: 4\' 6"  (1.372 m).   Weight as of this encounter: 75.4 kg. Up with therapy  DVT Prophylaxis - Xarelto and TED hose Weight bearing as tolerated D/C knee immobilizer Hemovac pulled without difficulty Begin therapy Hip precautions discussed with patient  Plan is to go Skilled nursing facility after hospital stay.  Plan for two sessions with PT today, and if meeting goals, will plan for discharge to SNF pending availability.   Patient to follow up in two weeks in clinic with Dr. .   The PDMP database was reviewed today prior to any opioid medications being prescribed to this patient.  Lequita Halt, MBA, PA-C Orthopedic Surgery 04/24/2021, 8:32 AM

## 2021-04-24 NOTE — Evaluation (Signed)
Physical Therapy Evaluation Patient Details Name: Marissa Gray MRN: [id number] DOB: Feb 18, 1981 Today's Date: 04/24/2021   History of Present Illness  Patient is a 40 year old woman with Down's Syndrome s/p left THA.  Clinical Impression  Pt s/p L THR and presents with decreased L LE strength/ROM, post op pain and posterior THP limiting functional mobility.  Pt would benefit from follow up rehab at SNF level to maximize IND and safety prior to return to group home.    Follow Up Recommendations SNF    Equipment Recommendations  Rolling walker with 5" wheels    Recommendations for Other Services       Precautions / Restrictions Precautions Precautions: Posterior Hip Precaution Booklet Issued: Yes (comment) Restrictions Weight Bearing Restrictions: No LLE Weight Bearing: Weight bearing as tolerated      Mobility  Bed Mobility               General bed mobility comments: Up to chair with OT and requests back to same    Transfers Overall transfer level: Needs assistance Equipment used: Rolling walker (2 wheeled) Transfers: Sit to/from Stand Sit to Stand: Min assist         General transfer comment: cues for LE management, use of UEs to self assist and adherence to THP; pysical assist to bring wt up and fwd and to balance in initial standing  Ambulation/Gait Ambulation/Gait assistance: Min assist Gait Distance (Feet): 18 Feet Assistive device: Rolling walker (2 wheeled) Gait Pattern/deviations: Step-to pattern;Decreased step length - right;Decreased step length - left;Shuffle;Trunk flexed Gait velocity: decr   General Gait Details: cues for sequence, posture and position from [employer]            Wheelchair Mobility    Modified Rankin (Stroke Patients Only)       Balance Overall balance assessment: Needs assistance Sitting-balance support: No upper extremity supported;Feet supported Sitting balance-Leahy Scale: Good     Standing balance  support: Bilateral upper extremity supported Standing balance-Leahy Scale: Poor                               Pertinent Vitals/Pain Pain Assessment: 0-10 Pain Score: 4  Pain Location: L hip Pain Descriptors / Indicators: Aching;Sore Pain Intervention(s): Limited activity within patient's tolerance;Monitored during session;Premedicated before session;Ice applied    Home Living Family/patient expects to be discharged to:: Skilled nursing facility                      Prior Function Level of Independence: Needs assistance   Gait / Transfers Assistance Needed: walked with walker  ADL's / Homemaking Assistance Needed: needed help with dressing on Left leg. Having assistance with bathing and toileting as well.        Hand Dominance   Dominant Hand: Right    Extremity/Trunk Assessment   Upper Extremity Assessment Upper Extremity Assessment: Defer to OT evaluation;Overall WFL for tasks assessed    Lower Extremity Assessment Lower Extremity Assessment: LLE deficits/detail LLE Deficits / Details: 2+/5 strength at hip with AAROM at hip to 75 flex and 15 abd       Communication   Communication: No difficulties  Cognition Arousal/Alertness: Awake/alert Behavior During Therapy: WFL for tasks assessed/performed Overall Cognitive Status: Within Functional Limits for tasks assessed  General Comments: Per prior chart review Mother states has he mental capacity of a 4th grader.      General Comments      Exercises Total Joint Exercises Ankle Circles/Pumps: AROM;Both;15 reps;Supine Quad Sets: AROM;Both;10 reps;Supine Heel Slides: AAROM;Left;15 reps;Supine Hip ABduction/ADduction: AAROM;Left;15 reps;Supine   Assessment/Plan    PT Assessment Patient needs continued PT services  PT Problem List Decreased strength;Decreased range of motion;Decreased activity tolerance;Decreased balance;Decreased  mobility;Decreased knowledge of use of DME;Obesity;Pain       PT Treatment Interventions DME instruction;Gait training;Stair training;Functional mobility training;Therapeutic activities;Therapeutic exercise;Patient/family education    PT Goals (Current goals can be found in the Care Plan section)  Acute Rehab PT Goals Patient Stated Goal: To be more independent in order to return to Group Home PT Goal Formulation: With patient Time For Goal Achievement: 05/01/21 Potential to Achieve Goals: Good    Frequency 7X/week   Barriers to discharge        Co-evaluation               AM-PAC PT "6 Clicks" Mobility  Outcome Measure Help needed turning from your back to your side while in a flat bed without using bedrails?: A Lot Help needed moving from lying on your back to sitting on the side of a flat bed without using bedrails?: A Little Help needed moving to and from a bed to a chair (including a wheelchair)?: A Little Help needed standing up from a chair using your arms (e.g., wheelchair or bedside chair)?: A Little Help needed to walk in hospital room?: A Little Help needed climbing 3-5 steps with a railing? : A Little 6 Click Score: 17    End of Session Equipment Utilized During Treatment: Gait belt Activity Tolerance: Patient tolerated treatment well;Patient limited by fatigue Patient left: in chair;with call bell/phone within reach;with family/visitor present Nurse Communication: Mobility status      Time: 0277-4128 PT Time Calculation (min) (ACUTE ONLY): 31 min   Charges:   PT Evaluation $PT Eval Low Complexity: 1 Low PT Treatments $Therapeutic Exercise: 8-22 mins        Mauro Kaufmann PT Acute Rehabilitation Services Pager [phone] Office [phone]   Fajr Fife 04/24/2021, 2:59 PM

## 2021-04-25 LAB — BASIC METABOLIC PANEL
Anion gap: 7 (ref 5–15)
BUN: 11 mg/dL (ref 6–20)
CO2: 23 mmol/L (ref 22–32)
Calcium: 8.1 mg/dL — ABNORMAL LOW (ref 8.9–10.3)
Chloride: 109 mmol/L (ref 98–111)
Creatinine, Ser: 0.52 mg/dL (ref 0.44–1.00)
GFR, Estimated: >60 mL/min (ref >60)
Glucose, Bld: 110 mg/dL — ABNORMAL HIGH (ref 70–99)
Potassium: 4 mmol/L (ref 3.5–5.1)
Sodium: 139 mmol/L (ref 135–145)

## 2021-04-25 LAB — CBC
HCT: 32.3 % — ABNORMAL LOW (ref 36.0–46.0)
Hemoglobin: 10.7 g/dL — ABNORMAL LOW (ref 12.0–15.0)
MCH: 36.5 pg — ABNORMAL HIGH (ref 26.0–34.0)
MCHC: 33.1 g/dL (ref 30.0–36.0)
MCV: 110.2 fL — ABNORMAL HIGH (ref 80.0–100.0)
Platelets: 288 10*3/uL (ref 150–400)
RBC: 2.93 MIL/uL — ABNORMAL LOW (ref 3.87–5.11)
RDW: 13.9 % (ref 11.5–15.5)
WBC: 10.3 10*3/uL (ref 4.0–10.5)
nRBC: 0 % (ref 0.0–0.2)

## 2021-04-25 NOTE — Progress Notes (Signed)
To Whom It May Concern:  Please be advised that the above name patient will require a short-term nursing home stay - anticipated 30 days or less rehabilitation and strengthening.  The plan is for return home. 

## 2021-04-25 NOTE — Progress Notes (Signed)
   Subjective: 2 Days Post-Op Procedure(s) (LRB): TOTAL HIP ARTHROPLASTY-Posterior (Left) Patient reports pain as mild.   Patient seen in rounds by Dr. Lequita Halt. Patient is well, and has had no acute complaints or problems. No acute overnight events. Denies SOB, chest pain, or calf pain. Ambulated 36 feet yesterday with PT.  We will continue therapy today.   Objective: Vital signs in last 24 hours: Temp:  [97.5 F (36.4 C)-98.4 F (36.9 C)] 98 F (36.7 C) (04/29 0532) Pulse Rate:  [82-95] 86 (04/29 0532) Resp:  [16] 16 (04/29 0532) BP: (105-133)/(54-75) 106/69 (04/29 0532) SpO2:  [96 %-99 %] 98 % (04/29 0532)  Intake/Output from previous day:  Intake/Output Summary (Last 24 hours) at 04/25/2021 0742 Last data filed at 04/25/2021 0600 Gross per 24 hour  Intake 480 ml  Output 900 ml  Net -420 ml     Intake/Output this shift: No intake/output data recorded.  Labs: Recent Labs    04/24/21 0317 04/25/21 0326  HGB 11.9* 10.7*   Recent Labs    04/24/21 0317 04/25/21 0326  WBC 9.0 10.3  RBC 3.26* 2.93*  HCT 35.6* 32.3*  PLT 304 288   Recent Labs    04/24/21 0317 04/25/21 0326  NA 136 139  K 4.3 4.0  CL 107 109  CO2 21* 23  BUN 11 11  CREATININE 0.67 0.52  GLUCOSE 117* 110*  CALCIUM 8.0* 8.1*   No results for input(s): LABPT, INR in the last 72 hours.  Exam: General - Patient is Alert and Oriented Extremity - Neurologically intact Neurovascular intact Intact pulses distally Dorsiflexion/Plantar flexion intact Dressing - dressing C/D/I Motor Function - intact, moving foot and toes well on exam.   Past Medical History:  Diagnosis Date  . Asthma   . 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 Closed bicondylar fracture of right tibial plateau 06/20/2017  . 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 Dermatitis    left leg   . Down's syndrome   . GERD (gastroesophageal reflux disease)   . Hashimoto's disease   . Heart murmur   .  Hypothyroidism   . Pneumonia    hx of years ago   . Rheumatoid arthritis (HCC)   . Strabismus     Assessment/Plan: 2 Days Post-Op Procedure(s) (LRB): TOTAL HIP ARTHROPLASTY-Posterior (Left) Principal Problem:   OA (osteoarthritis) of hip Active Problems:   Primary osteoarthritis of left hip  Estimated body mass index is 40.08 kg/m as calculated from the following:   Height as of this encounter: 4\' 6"  (1.372 m).   Weight as of this encounter: 75.4 kg. Up with therapy  DVT Prophylaxis - Xarelto and TED hose Weight bearing as tolerated D/C knee immobilizer Hemovac pulled without difficulty Begin therapy Hip precautions discussed with patient  Plan is to go Skilled nursing facility after hospital stay.  Plan for two sessions with PT today, and if meeting goals, will plan for discharge to SNF pending availability.   Patient to follow up in two weeks in clinic with Dr. .   The PDMP database was reviewed today prior to any opioid medications being prescribed to this patient.  Lequita Halt, MBA, PA-C Orthopedic Surgery 04/25/2021, 7:42 AM

## 2021-04-25 NOTE — TOC Progression Note (Signed)
Transition of Care [facility]) - Progression Note   Patient Details  Name: SEMAYA VIDA MRN: [id number] Date of Birth: 07-Aug-1981  Transition of [facility]) CM/SW Contact  Ewing Schlein, LCSW Phone Number: 04/25/2021, 2:05 PM  Clinical Narrative: CSW left voicemail for Jan Briggs [phone]) regarding family's request for SNF at [facility]. CSW to widen SNF search and initial referral was faxed out to additional local facilities. Clinicals uploaded to PASRR. PASRR number received: [id number] E. CSW updated mother regarding no bed offers at this time. TOC awaiting bed offers.  Expected Discharge Plan: Skilled Nursing Facility Barriers to Discharge: Awaiting State Approval (PASRR),SNF Pending bed offer  Expected Discharge Plan and Services Expected Discharge Plan: Skilled Nursing Facility In-house Referral: Clinical Social Work Post Acute Care Choice: Skilled Nursing Facility Living arrangements for the past 2 months: Group Home Expected Discharge Date: 04/24/21               DME Arranged: Dan Humphreys youth DME Agency: [employer] Representative spoke with at DME Agency: Pre-arranged before surgery  Readmission Risk Interventions No flowsheet data found.

## 2021-04-25 NOTE — Progress Notes (Signed)
Occupational Therapy Treatment Patient Details Name: Marissa Gray MRN: [id number] DOB: [birth year] Today's Date: 04/25/2021    History of present illness Patient is a 40 year old woman with Down's Syndrome s/p left THA.   OT comments  Treatment focused on use of AE for lower body dressing. Patient needing verbal cues to maintain hip precautions (mainly no bending) persistently. Difficulty using AE due to patient's small stature and size of her hands. AE may have to be altered in some way. Will need reiteration of precautions and compensatory strategies consistently. Cont POC.  Follow Up Recommendations  SNF    Equipment Recommendations  None recommended by OT    Recommendations for Other Services      Precautions / Restrictions Precautions Precautions: Posterior Hip Precaution Comments: Needed verbal cues to remember 2/3 precautions Restrictions Weight Bearing Restrictions: No LLE Weight Bearing: Weight bearing as tolerated       Mobility Bed Mobility                    Transfers Overall transfer level: Needs assistance Equipment used: Rolling walker (2 wheeled)             General transfer comment: Up in chair. Min guard for overall standing and transfers. Verbal cues to improve posture, limit bending over with sit to stand.    Balance Overall balance assessment: Mild deficits observed, not formally tested                                         ADL either performed or assessed with clinical judgement   ADL Overall ADL's : Needs assistance/impaired                     Lower Body Dressing: Maximal assistance;Sit to/from stand;Adhering to back precautions Lower Body Dressing Details (indicate cue type and reason): Patient introduced to adaptive equipment. Needing assistance to manage reacher and sock aide - due to her very small hands. Constant verbal cues to keep from leaning forward. Discussed with parent that she will probabaly  need continued assistance to don shoes and tie laces as well as assistance for toileting. Recommended potential use of bidet as well.                     Vision Patient Visual Report: No change from baseline     Perception     Praxis      Cognition Arousal/Alertness: Awake/alert Behavior During Therapy: WFL for tasks assessed/performed Overall Cognitive Status: Within Functional Limits for tasks assessed                                 General Comments: Per prior chart review Mother states has he mental capacity of a 4th grader.        Exercises     Shoulder Instructions       General Comments      Pertinent Vitals/ Pain       Pain Assessment: Faces Faces Pain Scale: Hurts a little bit Pain Location: L hip Pain Descriptors / Indicators: Aching;Sore Pain Intervention(s): Monitored during session  Home Living  Prior Functioning/Environment              Frequency  Min 2X/week        Progress Toward Goals  OT Goals(current goals can now be found in the care plan section)  Progress towards OT goals: Progressing toward goals  Acute Rehab OT Goals Patient Stated Goal: To be more independent in order to return to Group Home OT Goal Formulation: With patient/family Time For Goal Achievement: 05/08/21 Potential to Achieve Goals: Good  Plan Discharge plan remains appropriate    Co-evaluation                 AM-PAC OT "6 Clicks" Daily Activity     Outcome Measure   Help from another person eating meals?: None Help from another person taking care of personal grooming?: A Little Help from another person toileting, which includes using toliet, bedpan, or urinal?: A Lot Help from another person bathing (including washing, rinsing, drying)?: A Lot Help from another person to put on and taking off regular upper body clothing?: A Little Help from another person to put on and  taking off regular lower body clothing?: A Lot 6 Click Score: 16    End of Session Equipment Utilized During Treatment: Rolling walker  OT Visit Diagnosis: Other abnormalities of gait and mobility (R26.89);Pain Pain - Right/Left: Left Pain - part of body: Hip   Activity Tolerance Patient tolerated treatment well   Patient Left  (left in care of Hunter, PT)   Nurse Communication Mobility status        Time: 1033-1100 OT Time Calculation (min): 27 min  Charges: OT General Charges $OT Visit: 1 Visit OT Treatments $Self Care/Home Management : 23-37 mins  Ferris Fielden, OTR/L Acute Care Rehab Services  Office [phone] Pager: [phone]    Kelli Churn 04/25/2021, 1:32 PM

## 2021-04-25 NOTE — Progress Notes (Signed)
Physical Therapy Treatment Patient Details Name: Marissa Gray MRN: [id number] DOB: 01/09/1981 Today's Date: 04/25/2021    History of Present Illness Patient is a 40 year old woman with Down's Syndrome s/p left THA.    PT Comments    Pt continues very pleasant, cooperative and progressing steadily with mobility.   Follow Up Recommendations  SNF     Equipment Recommendations  Rolling walker with 5" wheels    Recommendations for Other Services       Precautions / Restrictions Precautions Precautions: Posterior Hip Precaution Booklet Issued: Yes (comment) Precaution Comments: Pt recalls 2/3 THP with out cues but continues to attempt to break them intermittently Restrictions Weight Bearing Restrictions: No LLE Weight Bearing: Weight bearing as tolerated    Mobility  Bed Mobility Overal bed mobility: Needs Assistance Bed Mobility: Sit to Supine       Sit to supine: Min assist   General bed mobility comments: cues for sequence, adherence to THP and use of R LE to self assist    Transfers Overall transfer level: Needs assistance Equipment used: Rolling walker (2 wheeled) Transfers: Sit to/from Stand Sit to Stand: Min guard         General transfer comment: cues for LE management and use of UEs to self assist  Ambulation/Gait Ambulation/Gait assistance: Min guard Gait Distance (Feet): 72 Feet Assistive device: Rolling walker (2 wheeled) Gait Pattern/deviations: Step-to pattern;Decreased step length - right;Decreased step length - left;Shuffle;Trunk flexed Gait velocity: decr   General Gait Details: cues for sequence, posture, position from RW, and adherence to THP on turns   Stairs             Wheelchair Mobility    Modified Rankin (Stroke Patients Only)       Balance Overall balance assessment: Mild deficits observed, not formally tested Sitting-balance support: No upper extremity supported;Feet supported Sitting balance-Leahy Scale: Good      Standing balance support: No upper extremity supported Standing balance-Leahy Scale: Fair Standing balance comment: able take hands off of walker                            Cognition Arousal/Alertness: Awake/alert Behavior During Therapy: WFL for tasks assessed/performed Overall Cognitive Status: Within Functional Limits for tasks assessed                                 General Comments: Per prior chart review Mother states has he mental capacity of a 4th grader.      Exercises Total Joint Exercises Ankle Circles/Pumps: AROM;Both;15 reps;Supine Quad Sets: AROM;Both;10 reps;Supine Heel Slides: AAROM;Left;15 reps;Supine Hip ABduction/ADduction: AAROM;Left;Supine;20 reps    General Comments        Pertinent Vitals/Pain Pain Assessment: 0-10 Pain Score: 5  Faces Pain Scale: Hurts a little bit Pain Location: L hip Pain Descriptors / Indicators: Aching;Sore Pain Intervention(s): Limited activity within patient's tolerance;Monitored during session;Premedicated before session;Ice applied    Home Living                      Prior Function            PT Goals (current goals can now be found in the care plan section) Acute Rehab PT Goals Patient Stated Goal: To be more independent in order to return to Group Home PT Goal Formulation: With patient Time For Goal Achievement: 05/01/21 Potential to Achieve  Goals: Good Progress towards PT goals: Progressing toward goals    Frequency    7X/week      PT Plan Current plan remains appropriate    Co-evaluation              AM-PAC PT "6 Clicks" Mobility   Outcome Measure  Help needed turning from your back to your side while in a flat bed without using bedrails?: A Lot Help needed moving from lying on your back to sitting on the side of a flat bed without using bedrails?: A Little Help needed moving to and from a bed to a chair (including a wheelchair)?: A Little Help needed  standing up from a chair using your arms (e.g., wheelchair or bedside chair)?: A Little Help needed to walk in hospital room?: A Little Help needed climbing 3-5 steps with a railing? : A Little 6 Click Score: 17    End of Session Equipment Utilized During Treatment: Gait belt Activity Tolerance: Patient tolerated treatment well Patient left: in bed;with call bell/phone within reach;with family/visitor present Nurse Communication: Mobility status PT Visit Diagnosis: Difficulty in walking, not elsewhere classified (R26.2)     Time: 1610-9604 PT Time Calculation (min) (ACUTE ONLY): 23 min  Charges:  $Gait Training: 23-37 mins $Therapeutic Exercise: 8-22 mins                     Mauro Kaufmann PT Acute Rehabilitation Services Pager [phone] Office [phone]    Anaston Koehn 04/25/2021, 4:27 PM

## 2021-04-25 NOTE — Progress Notes (Signed)
Physical Therapy Treatment Patient Details Name: Marissa Gray MRN: [id number] DOB: 10/03/1981 Today's Date: 04/25/2021    History of Present Illness Patient is a 40 year old woman with Down's Syndrome s/p left THA.    PT Comments    Pt pleasant, very cooperative and progressing steadily with mobility.   Follow Up Recommendations  SNF     Equipment Recommendations  Rolling walker with 5" wheels    Recommendations for Other Services       Precautions / Restrictions Precautions Precautions: Posterior Hip Precaution Booklet Issued: Yes (comment) Precaution Comments: Needed verbal cues to remember 2/3 precautions Restrictions Weight Bearing Restrictions: No LLE Weight Bearing: Weight bearing as tolerated    Mobility  Bed Mobility               General bed mobility comments: Pt up in chair and requests back to same    Transfers Overall transfer level: Needs assistance Equipment used: Rolling walker (2 wheeled) Transfers: Sit to/from Stand Sit to Stand: Min assist         General transfer comment: cues for LE management and use of UEs to self assist  Ambulation/Gait Ambulation/Gait assistance: Min assist;Min guard Gait Distance (Feet): 58 Feet Assistive device: Rolling walker (2 wheeled) Gait Pattern/deviations: Step-to pattern;Decreased step length - right;Decreased step length - left;Shuffle;Trunk flexed Gait velocity: decr   General Gait Details: cues for sequence, posture and position from [employer]             Wheelchair Mobility    Modified Rankin (Stroke Patients Only)       Balance Overall balance assessment: Mild deficits observed, not formally tested Sitting-balance support: No upper extremity supported;Feet supported Sitting balance-Leahy Scale: Good     Standing balance support: No upper extremity supported Standing balance-Leahy Scale: Fair                              Cognition Arousal/Alertness:  Awake/alert Behavior During Therapy: WFL for tasks assessed/performed Overall Cognitive Status: Within Functional Limits for tasks assessed                                 General Comments: Per prior chart review Mother states has he mental capacity of a 4th grader.      Exercises Total Joint Exercises Ankle Circles/Pumps: AROM;Both;15 reps;Supine Quad Sets: AROM;Both;10 reps;Supine Heel Slides: AAROM;Left;15 reps;Supine Hip ABduction/ADduction: AAROM;Left;Supine;20 reps    General Comments        Pertinent Vitals/Pain Pain Assessment: 0-10 Pain Score: 5  Faces Pain Scale: Hurts a little bit Pain Location: L hip Pain Descriptors / Indicators: Aching;Sore Pain Intervention(s): Limited activity within patient's tolerance;Monitored during session;Premedicated before session;Ice applied    Home Living                      Prior Function            PT Goals (current goals can now be found in the care plan section) Acute Rehab PT Goals Patient Stated Goal: To be more independent in order to return to Group Home PT Goal Formulation: With patient Time For Goal Achievement: 05/01/21 Potential to Achieve Goals: Good Progress towards PT goals: Progressing toward goals    Frequency    7X/week      PT Plan Current plan remains appropriate    Co-evaluation  AM-PAC PT "6 Clicks" Mobility   Outcome Measure  Help needed turning from your back to your side while in a flat bed without using bedrails?: A Lot Help needed moving from lying on your back to sitting on the side of a flat bed without using bedrails?: A Little Help needed moving to and from a bed to a chair (including a wheelchair)?: A Little Help needed standing up from a chair using your arms (e.g., wheelchair or bedside chair)?: A Little Help needed to walk in hospital room?: A Little Help needed climbing 3-5 steps with a railing? : A Little 6 Click Score: 17    End of  Session Equipment Utilized During Treatment: Gait belt Activity Tolerance: Patient tolerated treatment well;Patient limited by fatigue Patient left: in chair;with call bell/phone within reach;with family/visitor present Nurse Communication: Mobility status PT Visit Diagnosis: Difficulty in walking, not elsewhere classified (R26.2)     Time: 3832-9191 PT Time Calculation (min) (ACUTE ONLY): 33 min  Charges:  $Gait Training: 8-22 mins $Therapeutic Exercise: 8-22 mins                     Marissa Gray PT Acute Rehabilitation Services Pager [phone] Office [phone]    Marissa Gray 04/25/2021, 1:59 PM

## 2021-04-26 LAB — CBC
HCT: 32.8 % — ABNORMAL LOW (ref 36.0–46.0)
Hemoglobin: 10.7 g/dL — ABNORMAL LOW (ref 12.0–15.0)
MCH: 36.6 pg — ABNORMAL HIGH (ref 26.0–34.0)
MCHC: 32.6 g/dL (ref 30.0–36.0)
MCV: 112.3 fL — ABNORMAL HIGH (ref 80.0–100.0)
Platelets: 306 10*3/uL (ref 150–400)
RBC: 2.92 MIL/uL — ABNORMAL LOW (ref 3.87–5.11)
RDW: 13.9 % (ref 11.5–15.5)
WBC: 8 10*3/uL (ref 4.0–10.5)
nRBC: 0 % (ref 0.0–0.2)

## 2021-04-26 NOTE — Plan of Care (Signed)
  Problem: Education: Goal: Knowledge of General Education information will improve Description Including pain rating scale, medication(s)/side effects and non-pharmacologic comfort measures Outcome: Progressing   

## 2021-04-26 NOTE — Progress Notes (Signed)
Physical Therapy Treatment Patient Details Name: Marissa Gray MRN: [id number] DOB: [birth year] Today's Date: 04/26/2021    History of Present Illness Patient is a 40 year old woman with Down's Syndrome s/p left THA.    PT Comments    Pt continues pleasant, cooperative and progressing slowly but steadily with all mobility tasks.  Follow Up Recommendations  SNF     Equipment Recommendations  Rolling walker with 5" wheels    Recommendations for Other Services       Precautions / Restrictions Precautions Precautions: Posterior Hip Precaution Comments: Pt recalls 2/3 THP with out cues but continues to attempt to break them intermittently Restrictions Weight Bearing Restrictions: No LLE Weight Bearing: Weight bearing as tolerated    Mobility  Bed Mobility Overal bed mobility: Needs Assistance Bed Mobility: Sit to Supine       Sit to supine: Min assist   General bed mobility comments: cues for sequence and use of R LE to self assist    Transfers Overall transfer level: Needs assistance Equipment used: Rolling walker (2 wheeled) Transfers: Sit to/from Stand Sit to Stand: Min guard         General transfer comment: cues for LE management and use of UEs to self assist  Ambulation/Gait Ambulation/Gait assistance: Min guard Gait Distance (Feet): 87 Feet Assistive device: Rolling walker (2 wheeled) Gait Pattern/deviations: Step-to pattern;Decreased step length - right;Decreased step length - left;Shuffle;Trunk flexed Gait velocity: decr   General Gait Details: cues for sequence, posture, position from RW, and adherence to THP on turns   Stairs             Wheelchair Mobility    Modified Rankin (Stroke Patients Only)       Balance Overall balance assessment: Mild deficits observed, not formally tested Sitting-balance support: No upper extremity supported;Feet supported Sitting balance-Leahy Scale: Good     Standing balance support: No upper  extremity supported Standing balance-Leahy Scale: Fair Standing balance comment: able take hands off of walker                            Cognition Arousal/Alertness: Awake/alert Behavior During Therapy: WFL for tasks assessed/performed Overall Cognitive Status: Within Functional Limits for tasks assessed                                 General Comments: Per prior chart review Mother states has he mental capacity of a 4th grader.      Exercises Total Joint Exercises Ankle Circles/Pumps: AROM;Both;15 reps;Supine Quad Sets: AROM;Both;10 reps;Supine Heel Slides: AAROM;Left;Supine;20 reps Hip ABduction/ADduction: AAROM;Left;Supine;15 reps    General Comments        Pertinent Vitals/Pain Pain Assessment: 0-10 Pain Score: 4  Pain Location: L hip Pain Descriptors / Indicators: Aching;Sore Pain Intervention(s): Limited activity within patient's tolerance;Monitored during session;Premedicated before session;Ice applied    Home Living                      Prior Function            PT Goals (current goals can now be found in the care plan section) Acute Rehab PT Goals Patient Stated Goal: To be more independent in order to return to Group Home PT Goal Formulation: With patient Time For Goal Achievement: 05/01/21 Potential to Achieve Goals: Good Progress towards PT goals: Progressing toward goals    Frequency  7X/week      PT Plan Current plan remains appropriate    Co-evaluation              AM-PAC PT "6 Clicks" Mobility   Outcome Measure  Help needed turning from your back to your side while in a flat bed without using bedrails?: A Lot Help needed moving from lying on your back to sitting on the side of a flat bed without using bedrails?: A Little Help needed moving to and from a bed to a chair (including a wheelchair)?: A Little Help needed standing up from a chair using your arms (e.g., wheelchair or bedside chair)?: A  Little Help needed to walk in hospital room?: A Little Help needed climbing 3-5 steps with a railing? : A Little 6 Click Score: 17    End of Session Equipment Utilized During Treatment: Gait belt Activity Tolerance: Patient tolerated treatment well Patient left: with call bell/phone within reach;with family/visitor present;in bed;with bed alarm set Nurse Communication: Mobility status PT Visit Diagnosis: Difficulty in walking, not elsewhere classified (R26.2)     Time: 1541-1610 PT Time Calculation (min) (ACUTE ONLY): 29 min  Charges:  $Gait Training: 8-22 mins $Therapeutic Exercise: 8-22 mins                     Mauro Kaufmann PT Acute Rehabilitation Services Pager [phone] Office [phone]    Ramone Gander 04/26/2021, 5:28 PM

## 2021-04-26 NOTE — Progress Notes (Signed)
Subjective: 3 Days Post-Op Procedure(s) (LRB): TOTAL HIP ARTHROPLASTY-Posterior (Left) Patient reports pain as mild.    Objective: Vital signs in last 24 hours: Temp:  [97.7 F (36.5 C)-98 F (36.7 C)] 97.9 F (36.6 C) (04/30 0534) Pulse Rate:  [84] 84 (04/30 0534) Resp:  [16-17] 17 (04/30 0534) BP: (103-124)/(65-87) 124/87 (04/30 0534) SpO2:  [98 %-100 %] 100 % (04/30 0534)  Intake/Output from previous day: 04/29 0701 - 04/30 0700 In: 870 [P.O.:870] Out: -  Intake/Output this shift: Total I/O In: 240 [P.O.:240] Out: -   Recent Labs    04/24/21 0317 04/25/21 0326 04/26/21 0257  HGB 11.9* 10.7* 10.7*   Recent Labs    04/25/21 0326 04/26/21 0257  WBC 10.3 8.0  RBC 2.93* 2.92*  HCT 32.3* 32.8*  PLT 288 306   Recent Labs    04/24/21 0317 04/25/21 0326  NA 136 139  K 4.3 4.0  CL 107 109  CO2 21* 23  BUN 11 11  CREATININE 0.67 0.52  GLUCOSE 117* 110*  CALCIUM 8.0* 8.1*   No results for input(s): LABPT, INR in the last 72 hours.  Neurologically intact ABD soft Neurovascular intact Sensation intact distally Intact pulses distally Dorsiflexion/Plantar flexion intact Incision: dressing C/D/I and no drainage No cellulitis present Compartment soft no sign of DVT   Assessment/Plan: 3 Days Post-Op Procedure(s) (LRB): TOTAL HIP ARTHROPLASTY-Posterior (Left) Advance diet Up with therapy D/C IV fluids Waiting for SNF approval/bed  Marissa Gray 04/26/2021, 10:44 AM

## 2021-04-26 NOTE — Progress Notes (Signed)
Physical Therapy Treatment Patient Details Name: Marissa Gray MRN: [id number] DOB: [birth year] Today's Date: 04/26/2021    History of Present Illness Patient is a 40 year old woman with Down's Syndrome s/p left THA.    PT Comments    Pt continues very cooperative and is progressing steadily with mobility with noted improvements in ambulatory stability and activity tolerance.   Follow Up Recommendations  SNF     Equipment Recommendations  Rolling walker with 5" wheels    Recommendations for Other Services       Precautions / Restrictions Precautions Precautions: Posterior Hip Precaution Comments: Pt recalls 2/3 THP with out cues but continues to attempt to break them intermittently Restrictions Weight Bearing Restrictions: No LLE Weight Bearing: Weight bearing as tolerated    Mobility  Bed Mobility               General bed mobility comments: Pt up in chair and requests back to same    Transfers Overall transfer level: Needs assistance Equipment used: Rolling walker (2 wheeled) Transfers: Sit to/from Stand Sit to Stand: Min guard         General transfer comment: cues for LE management and use of UEs to self assist  Ambulation/Gait Ambulation/Gait assistance: Min guard Gait Distance (Feet): 98 Feet Assistive device: Rolling walker (2 wheeled) Gait Pattern/deviations: Step-to pattern;Decreased step length - right;Decreased step length - left;Shuffle;Trunk flexed Gait velocity: decr   General Gait Details: cues for sequence, posture, position from RW, and adherence to THP on turns   Stairs             Wheelchair Mobility    Modified Rankin (Stroke Patients Only)       Balance Overall balance assessment: Mild deficits observed, not formally tested Sitting-balance support: No upper extremity supported;Feet supported Sitting balance-Leahy Scale: Good     Standing balance support: No upper extremity supported Standing balance-Leahy Scale:  Fair Standing balance comment: able take hands off of walker                            Cognition Arousal/Alertness: Awake/alert Behavior During Therapy: WFL for tasks assessed/performed Overall Cognitive Status: Within Functional Limits for tasks assessed                                        Exercises Total Joint Exercises Ankle Circles/Pumps: AROM;Both;15 reps;Supine    General Comments        Pertinent Vitals/Pain Pain Assessment: 0-10 Pain Score: 4  Pain Location: L hip Pain Descriptors / Indicators: Aching;Sore Pain Intervention(s): Limited activity within patient's tolerance;Monitored during session;Premedicated before session;Ice applied    Home Living                      Prior Function            PT Goals (current goals can now be found in the care plan section) Acute Rehab PT Goals Patient Stated Goal: To be more independent in order to return to Group Home PT Goal Formulation: With patient Time For Goal Achievement: 05/01/21 Potential to Achieve Goals: Good Progress towards PT goals: Progressing toward goals    Frequency    7X/week      PT Plan Current plan remains appropriate    Co-evaluation  AM-PAC PT "6 Clicks" Mobility   Outcome Measure  Help needed turning from your back to your side while in a flat bed without using bedrails?: A Lot Help needed moving from lying on your back to sitting on the side of a flat bed without using bedrails?: A Little Help needed moving to and from a bed to a chair (including a wheelchair)?: A Little Help needed standing up from a chair using your arms (e.g., wheelchair or bedside chair)?: A Little Help needed to walk in hospital room?: A Little Help needed climbing 3-5 steps with a railing? : A Little 6 Click Score: 17    End of Session Equipment Utilized During Treatment: Gait belt Activity Tolerance: Patient tolerated treatment well Patient left:  with call bell/phone within reach;with family/visitor present;in chair Nurse Communication: Mobility status PT Visit Diagnosis: Difficulty in walking, not elsewhere classified (R26.2)     Time: 4268-3419 PT Time Calculation (min) (ACUTE ONLY): 25 min  Charges:  $Gait Training: 23-37 mins                     Mauro Kaufmann PT Acute Rehabilitation Services Pager [phone] Office [phone]    Kule Gascoigne 04/26/2021, 12:25 PM

## 2021-04-26 NOTE — Plan of Care (Signed)

## 2021-04-27 NOTE — Progress Notes (Signed)
Physical Therapy Treatment Patient Details Name: Marissa Gray MRN: [id number] DOB: [birth year] Today's Date: 04/27/2021    History of Present Illness Patient is a 40 year old woman with Down's Syndrome s/p left THA.    PT Comments    Pt continues very cooperative and progressing steadily with mobility but continues to require intermittent cueing for THP.   Follow Up Recommendations  SNF     Equipment Recommendations  Rolling walker with 5" wheels    Recommendations for Other Services       Precautions / Restrictions Precautions Precautions: Posterior Hip Precaution Comments: Pt recalls all THP but continues to intermettently be cued with attempts to break them Restrictions Weight Bearing Restrictions: No LLE Weight Bearing: Weight bearing as tolerated    Mobility  Bed Mobility               General bed mobility comments: Pt up in chair and requests back to same    Transfers Overall transfer level: Needs assistance Equipment used: Rolling walker (2 wheeled) Transfers: Sit to/from Stand Sit to Stand: Min guard         General transfer comment: cues for LE management and use of UEs to self assist  Ambulation/Gait Ambulation/Gait assistance: Min guard;Supervision Gait Distance (Feet): 118 Feet Assistive device: Rolling walker (2 wheeled) Gait Pattern/deviations: Step-to pattern;Decreased step length - right;Decreased step length - left;Shuffle;Trunk flexed Gait velocity: decr   General Gait Details: cues for sequence, posture, position from RW, and adherence to THP on turns   Stairs             Wheelchair Mobility    Modified Rankin (Stroke Patients Only)       Balance Overall balance assessment: Mild deficits observed, not formally tested Sitting-balance support: No upper extremity supported;Feet supported Sitting balance-Leahy Scale: Good     Standing balance support: No upper extremity supported Standing balance-Leahy Scale:  Fair Standing balance comment: able take hands off of walker                            Cognition Arousal/Alertness: Awake/alert Behavior During Therapy: WFL for tasks assessed/performed Overall Cognitive Status: Within Functional Limits for tasks assessed                                        Exercises      General Comments        Pertinent Vitals/Pain Pain Assessment: 0-10 Pain Score: 3  Pain Location: L hip Pain Descriptors / Indicators: Aching;Sore Pain Intervention(s): Limited activity within patient's tolerance;Monitored during session;Premedicated before session;Ice applied    Home Living                      Prior Function            PT Goals (current goals can now be found in the care plan section) Acute Rehab PT Goals Patient Stated Goal: To be more independent in order to return to Group Home PT Goal Formulation: With patient Time For Goal Achievement: 05/01/21 Potential to Achieve Goals: Good Progress towards PT goals: Progressing toward goals    Frequency    7X/week      PT Plan Current plan remains appropriate    Co-evaluation              AM-PAC PT "6 Clicks" Mobility  Outcome Measure  Help needed turning from your back to your side while in a flat bed without using bedrails?: A Lot Help needed moving from lying on your back to sitting on the side of a flat bed without using bedrails?: A Little Help needed moving to and from a bed to a chair (including a wheelchair)?: A Little Help needed standing up from a chair using your arms (e.g., wheelchair or bedside chair)?: A Little Help needed to walk in hospital room?: A Little Help needed climbing 3-5 steps with a railing? : A Little 6 Click Score: 17    End of Session Equipment Utilized During Treatment: Gait belt Activity Tolerance: Patient tolerated treatment well Patient left: with call bell/phone within reach;with family/visitor present;in  bed;with bed alarm set Nurse Communication: Mobility status PT Visit Diagnosis: Difficulty in walking, not elsewhere classified (R26.2)     Time: 8938-1017 PT Time Calculation (min) (ACUTE ONLY): 30 min  Charges:  $Gait Training: 23-37 mins                     Mauro Kaufmann PT Acute Rehabilitation Services Pager [phone] Office [phone]    Haddy Mullinax 04/27/2021, 1:05 PM

## 2021-04-27 NOTE — Plan of Care (Signed)
  Problem: Pain Management: Goal: Pain level will decrease with appropriate interventions Outcome: Progressing   Problem: Skin Integrity: Goal: Will show signs of wound healing Outcome: Progressing   

## 2021-04-27 NOTE — Progress Notes (Signed)
Physical Therapy Treatment Patient Details Name: Marissa Gray MRN: [id number] DOB: 1981/12/26 Today's Date: 04/27/2021    History of Present Illness Patient is a 40 year old woman with Down's Syndrome s/p left THA.    PT Comments    Pt continues very cooperative and with steady progress with mobility.  Pt demonstrating improvement in activity tolerance, requiring decreased assist for all activities and with improving awareness of THP during functional activities.   Follow Up Recommendations  SNF     Equipment Recommendations  Rolling walker with 5" wheels    Recommendations for Other Services       Precautions / Restrictions Precautions Precautions: Posterior Hip Precaution Booklet Issued: Yes (comment) Precaution Comments: Pt recalls all THP but continues to intermettently be cued with attempts to break them Restrictions Weight Bearing Restrictions: No LLE Weight Bearing: Weight bearing as tolerated    Mobility  Bed Mobility Overal bed mobility: Needs Assistance Bed Mobility: Sit to Supine       Sit to supine: Min guard   General bed mobility comments: cues for sequence and adherence to THP    Transfers Overall transfer level: Needs assistance Equipment used: Rolling walker (2 wheeled) Transfers: Sit to/from Stand Sit to Stand: Supervision         General transfer comment: Min cues for sequence and adherence to THP  Ambulation/Gait Ambulation/Gait assistance: Min guard;Supervision Gait Distance (Feet): 118 Feet Assistive device: Rolling walker (2 wheeled) Gait Pattern/deviations: Step-to pattern;Decreased step length - right;Decreased step length - left;Shuffle;Trunk flexed Gait velocity: decr   General Gait Details: min cues for sequence, posture, position from RW, and adherence to THP on turns   Stairs             Wheelchair Mobility    Modified Rankin (Stroke Patients Only)       Balance Overall balance assessment: Mild deficits  observed, not formally tested Sitting-balance support: No upper extremity supported;Feet supported Sitting balance-Leahy Scale: Good     Standing balance support: No upper extremity supported Standing balance-Leahy Scale: Fair                              Cognition Arousal/Alertness: Awake/alert Behavior During Therapy: WFL for tasks assessed/performed Overall Cognitive Status: Within Functional Limits for tasks assessed                                 General Comments: Per prior chart review Mother states has he mental capacity of a 4th grader.      Exercises Total Joint Exercises Ankle Circles/Pumps: AROM;Both;15 reps;Supine Quad Sets: AROM;Both;10 reps;Supine Heel Slides: AAROM;Left;Supine;20 reps Hip ABduction/ADduction: AAROM;Left;Supine;20 reps    General Comments        Pertinent Vitals/Pain Pain Assessment: 0-10 Pain Score: 3  Pain Location: L hip Pain Descriptors / Indicators: Aching;Sore Pain Intervention(s): Limited activity within patient's tolerance;Monitored during session;Premedicated before session;Ice applied    Home Living                      Prior Function            PT Goals (current goals can now be found in the care plan section) Acute Rehab PT Goals Patient Stated Goal: To be more independent in order to return to Group Home PT Goal Formulation: With patient Time For Goal Achievement: 05/01/21 Potential to Achieve Goals: Good  Progress towards PT goals: Progressing toward goals    Frequency    7X/week      PT Plan Current plan remains appropriate    Co-evaluation              AM-PAC PT "6 Clicks" Mobility   Outcome Measure  Help needed turning from your back to your side while in a flat bed without using bedrails?: A Lot Help needed moving from lying on your back to sitting on the side of a flat bed without using bedrails?: A Little Help needed moving to and from a bed to a chair  (including a wheelchair)?: A Little Help needed standing up from a chair using your arms (e.g., wheelchair or bedside chair)?: A Little Help needed to walk in hospital room?: A Little Help needed climbing 3-5 steps with a railing? : A Little 6 Click Score: 17    End of Session Equipment Utilized During Treatment: Gait belt Activity Tolerance: Patient tolerated treatment well Patient left: with call bell/phone within reach;with family/visitor present;in bed;with bed alarm set Nurse Communication: Mobility status PT Visit Diagnosis: Difficulty in walking, not elsewhere classified (R26.2)     Time: 1510-1540 PT Time Calculation (min) (ACUTE ONLY): 30 min  Charges:  $Gait Training: 8-22 mins $Therapeutic Exercise: 8-22 mins                     Mauro Kaufmann PT Acute Rehabilitation Services Pager [phone] Office [phone]    Lajuan Godbee 04/27/2021, 4:33 PM

## 2021-04-27 NOTE — Progress Notes (Signed)
    Subjective:  Patient reports pain as mild.  Denies N/V/CP/SOB. No c/o.   Objective:   VITALS:   Vitals:   04/26/21 1506 04/26/21 1731 04/26/21 2100 04/27/21 0535  BP: 98/64 112/68 110/64 98/66  Pulse: 87 90 85 79  Resp: 19  16 16   Temp: 98 F (36.7 C)  98.9 F (37.2 C) 98.1 F (36.7 C)  TempSrc: Oral  Oral Oral  SpO2: 96% 100% 99% 97%  Weight:      Height:        NAD ABD soft Sensation intact distally Intact pulses distally Dorsiflexion/Plantar flexion intact Incision: dressing C/D/I Compartment soft   Lab Results  Component Value Date   WBC 8.0 04/26/2021   HGB 10.7 (L) 04/26/2021   HCT 32.8 (L) 04/26/2021   MCV 112.3 (H) 04/26/2021   PLT 306 04/26/2021   BMET    Component Value Date/Time   NA 139 04/25/2021 0326   NA 139 07/30/2017 0000   K 4.0 04/25/2021 0326   CL 109 04/25/2021 0326   CO2 23 04/25/2021 0326   GLUCOSE 110 (H) 04/25/2021 0326   BUN 11 04/25/2021 0326   BUN 18 07/30/2017 0000   CREATININE 0.52 04/25/2021 0326   CALCIUM 8.1 (L) 04/25/2021 0326   CALCIUM 7.5 (L) 06/22/2017 0607   GFRNONAA >60 04/25/2021 0326   GFRAA >60 06/24/2017 06/26/2017     Assessment/Plan: 4 Days Post-Op   Principal Problem:   OA (osteoarthritis) of hip Active Problems:   Primary osteoarthritis of left hip   WBAT with walker, posterior hip precautions DVT ppx: Xarelto, SCDs, TEDS PO pain control PT/OT Dispo: SNF placement   [id number] Garielle Mroz 04/27/2021, 8:05 AM   06/27/2021, MD [phone] [facility] is now [facility]  [place] [address], [address], [place], [place] Phone: [phone] [email or web address] Facebook  [phone]

## 2021-04-27 NOTE — Plan of Care (Signed)
  Problem: Education: Goal: Knowledge of General Education information will improve Description: Including pain rating scale, medication(s)/side effects and non-pharmacologic comfort measures Outcome: Progressing   Problem: Activity: Goal: Risk for activity intolerance will decrease Outcome: Progressing   Problem: Pain Managment: Goal: General experience of comfort will improve Outcome: Progressing   

## 2021-04-28 NOTE — Progress Notes (Signed)
Physical Therapy Treatment Patient Details Name: Marissa Gray MRN: [id number] DOB: 06-15-1981 Today's Date: 04/28/2021    History of Present Illness Patient is a 40 year old woman with Down's Syndrome s/p left THA.    PT Comments    POD # 4 am session Pt OOB in recliner with Mom at bedside.  Assisted with amb in hallway.  General Gait Details: min cues for sequence, posture, position from RW, and adherence to THP on turns. Then returned to room to perform some TE's following HEP handout.  Instructed on proper tech, freq as well as use of ICE.   Pt lives at a Group Home and will need ST Rehab at SNF prior to returning. .   Follow Up Recommendations  SNF     Equipment Recommendations  Rolling walker with 5" wheels    Recommendations for Other Services       Precautions / Restrictions Precautions Precautions: Posterior Hip Precaution Comments: Pt recalls all THP but continues to intermettently be cued with attempts to break them Restrictions Weight Bearing Restrictions: No LLE Weight Bearing: Weight bearing as tolerated    Mobility  Bed Mobility               General bed mobility comments: Pt OOB in recliner    Transfers Overall transfer level: Needs assistance Equipment used: Rolling walker (2 wheeled) Transfers: Sit to/from Stand Sit to Stand: Supervision         General transfer comment: Min cues for sequence and adherence to THP  Ambulation/Gait Ambulation/Gait assistance: Min guard;Supervision Gait Distance (Feet): 65 Feet Assistive device: Rolling walker (2 wheeled) Gait Pattern/deviations: Step-to pattern;Decreased step length - right;Decreased step length - left;Shuffle;Trunk flexed Gait velocity: decr   General Gait Details: min cues for sequence, posture, position from RW, and adherence to THP on turns   Stairs             Wheelchair Mobility    Modified Rankin (Stroke Patients Only)       Balance                                             Cognition Arousal/Alertness: Awake/alert Behavior During Therapy: WFL for tasks assessed/performed Overall Cognitive Status: Within Functional Limits for tasks assessed                                 General Comments: Per prior chart review Mother states has he mental capacity of a 4th grader.      Exercises   Total Hip Replacement TE's following HEP Handout 10 reps ankle pumps 05 reps knee presses 05 reps heel slides 05 reps SAQ's 05 reps ABD Instructed how to use a belt loop to assist  Followed by ICE    General Comments        Pertinent Vitals/Pain Pain Assessment: No/denies pain Faces Pain Scale: Hurts a little bit Pain Location: L hip Pain Descriptors / Indicators: Aching;Sore Pain Intervention(s): Monitored during session;Premedicated before session;Repositioned;Ice applied    Home Living                      Prior Function            PT Goals (current goals can now be found in the care plan section) Progress towards PT  goals: Progressing toward goals    Frequency    7X/week      PT Plan Current plan remains appropriate    Co-evaluation              AM-PAC PT "6 Clicks" Mobility   Outcome Measure  Help needed turning from your back to your side while in a flat bed without using bedrails?: A Lot Help needed moving from lying on your back to sitting on the side of a flat bed without using bedrails?: A Little Help needed moving to and from a bed to a chair (including a wheelchair)?: A Little Help needed standing up from a chair using your arms (e.g., wheelchair or bedside chair)?: A Little Help needed to walk in hospital room?: A Little Help needed climbing 3-5 steps with a railing? : A Little 6 Click Score: 17    End of Session Equipment Utilized During Treatment: Gait belt Activity Tolerance: Patient tolerated treatment well Patient left: with call bell/phone within reach;with  family/visitor present;in chair Nurse Communication: Mobility status PT Visit Diagnosis: Difficulty in walking, not elsewhere classified (R26.2)     Time: 1638-4665 PT Time Calculation (min) (ACUTE ONLY): 32 min  Charges:  $Gait Training: 8-22 mins $Therapeutic Exercise: 8-22 mins                     Felecia Shelling  PTA Acute  Rehabilitation Services Pager      [phone] Office      [phone]

## 2021-04-28 NOTE — Discharge Summary (Addendum)
Physician Discharge Summary   Patient ID: Marissa Gray MRN: [id number] DOB/AGE: [birth year] 40 y.o.  Admit date: 04/23/2021 Discharge date: 05/02/2021  Primary Diagnosis: Osteoarthritis, left hip   Admission Diagnoses:  Past Medical History:  Diagnosis Date  . Asthma   . 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 Closed bicondylar fracture of right tibial plateau 06/20/2017  . 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 Dermatitis    left leg   . Down's syndrome   . GERD (gastroesophageal reflux disease)   . Hashimoto's disease   . Heart murmur   . Hypothyroidism   . Pneumonia    hx of years ago   . Rheumatoid arthritis (HCC)   . Strabismus    Discharge Diagnoses:   Principal Problem:   OA (osteoarthritis) of hip Active Problems:   Primary osteoarthritis of left hip  Estimated body mass index is 40.08 kg/m as calculated from the following:   Height as of this encounter:  (1.372 m).   Weight as of this encounter: 75.4 kg.  Procedure:  Procedure(s) (LRB): TOTAL HIP ARTHROPLASTY-Posterior (Left)   Consults: None  HPI: Marissa Gray is a 40 y.o. female with end stage arthritis of her left hip with progressively worsening pain and dysfunction. Pain occurs with activity and rest including pain at night. She has tried analgesics, protected weight bearing and rest without benefit. Pain is too severe to attempt physical therapy. Radiographs demonstrate bone on bone arthritis with subchondral cyst formation. She presents now for left THA.  Laboratory Data: Admission on 04/23/2021  Component Date Value Ref Range Status  . Preg Test, Ur 04/23/2021 NEGATIVE  NEGATIVE Final   Comment:        THE SENSITIVITY OF THIS METHODOLOGY IS >20 mIU/mL. Performed at [facility], [address] [place], [place] [address]   . WBC 04/24/2021 9.0  4.0 - 10.5 K/uL Final  . RBC 04/24/2021 3.26* 3.87 - 5.11 MIL/uL Final  . Hemoglobin 04/24/2021  11.9* 12.0 - 15.0 g/dL Final  . HCT 19/14/7829 35.6* 36.0 - 46.0 % Final  . MCV 04/24/2021 109.2* 80.0 - 100.0 fL Final  . MCH 04/24/2021 36.5* 26.0 - 34.0 pg Final  . MCHC 04/24/2021 33.4  30.0 - 36.0 g/dL Final  . RDW 56/21/3086 13.5  11.5 - 15.5 % Final  . Platelets 04/24/2021 304  150 - 400 K/uL Final  . nRBC 04/24/2021 0.0  0.0 - 0.2 % Final   Performed at [facility], [address], [place], [place] [address]  . Sodium 04/24/2021 136  135 - 145 mmol/L Final  . Potassium 04/24/2021 4.3  3.5 - 5.1 mmol/L Final  . Chloride 04/24/2021 107  98 - 111 mmol/L Final  . CO2 04/24/2021 21* 22 - 32 mmol/L Final  . Glucose, Bld 04/24/2021 117* 70 - 99 mg/dL Final   Glucose reference range applies only to samples taken after fasting for at least 8 hours.  . BUN 04/24/2021 11  6 - 20 mg/dL Final  . Creatinine, Ser 04/24/2021 0.67  0.44 - 1.00 mg/dL Final  . Calcium 96/29/5284 8.0* 8.9 - 10.3 mg/dL Final  . GFR, Estimated 04/24/2021 >60  >60 mL/min Final   Comment: (NOTE) Calculated using the CKD-EPI Creatinine Equation (2021)   . Anion gap 04/24/2021 8  5 - 15 Final   Performed at [facility], [address] [place], [place] [address]  . WBC 04/25/2021 10.3  4.0 -  10.5 K/uL Final  . RBC 04/25/2021 2.93* 3.87 - 5.11 MIL/uL Final  . Hemoglobin 04/25/2021 10.7* 12.0 - 15.0 g/dL Final  . HCT 40/98/1191 32.3* 36.0 - 46.0 % Final  . MCV 04/25/2021 110.2* 80.0 - 100.0 fL Final  . MCH 04/25/2021 36.5* 26.0 - 34.0 pg Final  . MCHC 04/25/2021 33.1  30.0 - 36.0 g/dL Final  . RDW 47/82/9562 13.9  11.5 - 15.5 % Final  . Platelets 04/25/2021 288  150 - 400 K/uL Final  . nRBC 04/25/2021 0.0  0.0 - 0.2 % Final   Performed at [facility], [address] [place], [place] [address]  . Sodium 04/25/2021 139  135 - 145 mmol/L Final  . Potassium 04/25/2021 4.0  3.5 - 5.1 mmol/L Final  . Chloride 04/25/2021 109  98 - 111 mmol/L Final  . CO2  04/25/2021 23  22 - 32 mmol/L Final  . Glucose, Bld 04/25/2021 110* 70 - 99 mg/dL Final   Glucose reference range applies only to samples taken after fasting for at least 8 hours.  . BUN 04/25/2021 11  6 - 20 mg/dL Final  . Creatinine, Ser 04/25/2021 0.52  0.44 - 1.00 mg/dL Final  . Calcium 57/84/6962 8.1* 8.9 - 10.3 mg/dL Final  . GFR, Estimated 04/25/2021 >60  >60 mL/min Final   Comment: (NOTE) Calculated using the CKD-EPI Creatinine Equation (2021)   . Anion gap 04/25/2021 7  5 - 15 Final   Performed at [facility], [address] [place], [place] [address]  . WBC 04/26/2021 8.0  4.0 - 10.5 K/uL Final  . RBC 04/26/2021 2.92* 3.87 - 5.11 MIL/uL Final  . Hemoglobin 04/26/2021 10.7* 12.0 - 15.0 g/dL Final  . HCT 13/24/4010 32.8* 36.0 - 46.0 % Final  . MCV 04/26/2021 112.3* 80.0 - 100.0 fL Final  . MCH 04/26/2021 36.6* 26.0 - 34.0 pg Final  . MCHC 04/26/2021 32.6  30.0 - 36.0 g/dL Final  . RDW 27/25/3664 13.9  11.5 - 15.5 % Final  . Platelets 04/26/2021 306  150 - 400 K/uL Final  . nRBC 04/26/2021 0.0  0.0 - 0.2 % Final   Performed at [facility], [address] [place], [place] [address]  Hospital Outpatient Visit on 04/21/2021  Component Date Value Ref Range Status  . SARS Coronavirus 2 04/21/2021 NEGATIVE  NEGATIVE Final   Comment: (NOTE) SARS-CoV-2 target nucleic acids are NOT DETECTED.  The SARS-CoV-2 RNA is generally detectable in upper and lower respiratory specimens during the acute phase of infection. Negative results do not preclude SARS-CoV-2 infection, do not rule out co-infections with other pathogens, and should not be used as the sole basis for treatment or other patient management decisions. Negative results must be combined with clinical observations, patient history, and epidemiological information. The expected result is Negative.  Fact Sheet for Patients: HairSlick.no  Fact Sheet for  Healthcare Providers: quierodirigir.com  This test is not yet approved or cleared by the [place] FDA and  has been authorized for detection and/or diagnosis of SARS-CoV-2 by FDA under an Emergency Use Authorization (EUA). This EUA will remain  in effect (meaning this test can be used) for the duration of the COVID-19 declaration under Se                          ction 564(b)(1) of the Act, 21 U.S.C. section 360bbb-3(b)(1), unless the authorization is terminated or revoked sooner.  Performed at [facility], [address], [place], [place] [address]   Hospital Outpatient Visit on 04/14/2021  Component Date Value Ref Range Status  . MRSA, PCR 04/14/2021 NEGATIVE  NEGATIVE Final  . Staphylococcus aureus 04/14/2021 NEGATIVE  NEGATIVE Final   Comment: (NOTE) The Xpert SA Assay (FDA approved for NASAL specimens in patients 73 years of age and older), is one component of a comprehensive surveillance program. It is not intended to diagnose infection nor to guide or monitor treatment. Performed at [facility], [address] [place], [place] [address]   . WBC 04/14/2021 5.4  4.0 - 10.5 K/uL Final  . RBC 04/14/2021 3.67* 3.87 - 5.11 MIL/uL Final  . Hemoglobin 04/14/2021 13.6  12.0 - 15.0 g/dL Final  . HCT 19/14/7829 40.3  36.0 - 46.0 % Final  . MCV 04/14/2021 109.8* 80.0 - 100.0 fL Final  . MCH 04/14/2021 37.1* 26.0 - 34.0 pg Final  . MCHC 04/14/2021 33.7  30.0 - 36.0 g/dL Final  . RDW 56/21/3086 14.3  11.5 - 15.5 % Final  . Platelets 04/14/2021 395  150 - 400 K/uL Final  . nRBC 04/14/2021 0.0  0.0 - 0.2 % Final   Performed at [facility], [address], [place], [place] [address]  . Sodium 04/14/2021 143  135 - 145 mmol/L Final  . Potassium 04/14/2021 4.4  3.5 - 5.1 mmol/L Final  . Chloride 04/14/2021 110  98 - 111 mmol/L Final  . CO2 04/14/2021 22  22 - 32 mmol/L Final  . Glucose, Bld 04/14/2021 85  70 -  99 mg/dL Final   Glucose reference range applies only to samples taken after fasting for at least 8 hours.  . BUN 04/14/2021 18  6 - 20 mg/dL Final  . Creatinine, Ser 04/14/2021 0.70  0.44 - 1.00 mg/dL Final  . Calcium 96/29/5284 9.0  8.9 - 10.3 mg/dL Final  . Total Protein 04/14/2021 8.0  6.5 - 8.1 g/dL Final  . Albumin 13/24/4010 3.6  3.5 - 5.0 g/dL Final  . AST 27/25/3664 21  15 - 41 U/L Final  . ALT 04/14/2021 18  0 - 44 U/L Final  . Alkaline Phosphatase 04/14/2021 73  38 - 126 U/L Final  . Total Bilirubin 04/14/2021 0.4  0.3 - 1.2 mg/dL Final  . GFR, Estimated 04/14/2021 >60  >60 mL/min Final   Comment: (NOTE) Calculated using the CKD-EPI Creatinine Equation (2021)   . Anion gap 04/14/2021 11  5 - 15 Final   Performed at [facility], [address], [place], [place] [address]  . Prothrombin Time 04/14/2021 13.3  11.4 - 15.2 seconds Final  . INR 04/14/2021 1.0  0.8 - 1.2 Final   Comment: (NOTE) INR goal varies based on device and disease states. Performed at [facility], [address], [place], [place] [address]   . aPTT 04/14/2021 29  24 - 36 seconds Final   Performed at [facility], [address], [place], [place] [address]  . ABO/RH(D) 04/14/2021 O POS   Final  . Antibody Screen 04/14/2021 NEG   Final  . Sample Expiration 04/14/2021 04/26/2021,2359   Final  . Extend sample reason 04/14/2021    Final                   Value:NO TRANSFUSIONS OR PREGNANCY IN THE PAST 3 MONTHS Performed at [facility], [address] [place], [place] [address]  X-Rays:DG Pelvis Portable  Result Date: 04/23/2021 CLINICAL DATA:  Left hip arthroplasty. EXAM: PORTABLE PELVIS 1-2 VIEWS COMPARISON:  None. FINDINGS: Left hip arthroplasty in expected alignment. No periprosthetic lucency or fracture. Recent postsurgical change includes air and edema in the soft tissues. IMPRESSION: Left hip arthroplasty without  immediate postoperative complication. Electronically Signed   By: Narda Rutherford M.D.   On: 04/23/2021 19:02    EKG:No orders found for this or any previous visit.   Hospital Course: Marissa Gray is a 40 y.o. who was admitted to [facility]. They were brought to the operating room on 04/23/2021 and underwent Procedure(s): TOTAL HIP ARTHROPLASTY-Posterior.  Patient tolerated the procedure well and was later transferred to the recovery room and then to the orthopaedic floor for postoperative care. They were given PO and IV analgesics for pain control following their surgery. They were given 24 hours of postoperative antibiotics of  Anti-infectives (From admission, onward)   Start     Dose/Rate Route Frequency Ordered Stop   04/24/21 0600  vancomycin (VANCOCIN) IVPB 1000 mg/200 mL premix  Status:  Discontinued        1,000 mg 200 mL/hr over 60 Minutes Intravenous On call to O.R. 04/23/21 1252 04/23/21 1254   04/24/21 0200  vancomycin (VANCOREADY) IVPB 1000 mg/200 mL        1,000 mg 200 mL/hr over 60 Minutes Intravenous Every 12 hours 04/23/21 1953 04/24/21 0214   04/23/21 1300  vancomycin (VANCOCIN) IVPB 1000 mg/200 mL premix        1,000 mg 200 mL/hr over 60 Minutes Intravenous On call to O.R. 04/23/21 1254 04/23/21 1524     and started on DVT prophylaxis in the form of Xarelto.   PT and OT were ordered for total joint protocol. Discharge planning consulted to help with postop disposition and equipment needs. Patient had a good night on the evening of surgery. They started to get up OOB with therapy on POD #1. She continued to work with therapy into days two and three and over the weekend while bed arrangements were worked on for SNF. Dressing was checked every day, which was clean and dry with Aquacel in place. She was discharged on POD #9 in stable condition.  Diet: Regular diet Activity: WBAT; Posterior hip precautions x 6 weeks Follow-up: in 2 weeks Disposition: Skilled nursing  facility Discharged Condition: stable   Discharge Instructions    Call MD / Call 911   Complete by: As directed    If you experience chest pain or shortness of breath, CALL 911 and be transported to the hospital emergency room.  If you develope a fever above 101 F, pus (white drainage) or increased drainage or redness at the wound, or calf pain, call your surgeon's office.   Change dressing   Complete by: As directed    You have an adhesive waterproof bandage over the incision. Leave this in place until your first follow-up appointment. Once you remove this you will not need to place another bandage.   Constipation Prevention   Complete by: As directed    Drink plenty of fluids.  Prune juice may be helpful.  You may use a stool softener, such as Colace (over the counter) 100 mg twice a day.  Use MiraLax (over the counter) for constipation as needed.   Diet - low sodium heart healthy   Complete by: As directed    Do not sit on low chairs, stoools or toilet seats, as it may be  difficult to get up from low surfaces   Complete by: As directed    Driving restrictions   Complete by: As directed    No driving for two weeks   Post-operative opioid taper instructions:   Complete by: As directed    POST-OPERATIVE OPIOID TAPER INSTRUCTIONS: It is important to wean off of your opioid medication as soon as possible. If you do not need pain medication after your surgery it is ok to stop day one. Opioids include: Codeine, Hydrocodone(Norco, Vicodin), Oxycodone(Percocet, oxycontin) and hydromorphone amongst others.  Long term and even short term use of opiods can cause: Increased pain response Dependence Constipation Depression Respiratory depression And more.  Withdrawal symptoms can include Flu like symptoms Nausea, vomiting And more Techniques to manage these symptoms Hydrate well Eat regular healthy meals Stay active Use relaxation techniques(deep breathing, meditating, yoga) Do Not  substitute Alcohol to help with tapering If you have been on opioids for less than two weeks and do not have pain than it is ok to stop all together.  Plan to wean off of opioids This plan should start within one week post op of your joint replacement. Maintain the same interval or time between taking each dose and first decrease the dose.  Cut the total daily intake of opioids by one tablet each day Next start to increase the time between doses. The last dose that should be eliminated is the evening dose.      TED hose   Complete by: As directed    Use stockings (TED hose) for three weeks on both leg(s).  You may remove them at night for sleeping.   Weight bearing as tolerated   Complete by: As directed      Allergies as of 04/28/2021      Reactions   Ceclor [cefaclor] Hives   Penicillins Hives   Has patient had a PCN reaction causing immediate rash, facial/tongue/throat swelling, SOB or lightheadedness with hypotension: Unknown Has patient had a PCN reaction causing severe rash involving mucus membranes or skin necrosis: Unknown Has patient had a PCN reaction that required hospitalization: Unknown Has patient had a PCN reaction occurring within the last 10 years: Unknown If all of the above answers are "NO", then may proceed with Cephalosporin use.   Sulfa Antibiotics Hives, Other (See Comments)   FEVER      Medication List    STOP taking these medications   acetaminophen 500 MG tablet Commonly known as: TYLENOL   calcium carbonate 1500 (600 Ca) MG Tabs tablet Commonly known as: OSCAL   folic acid 1 MG tablet Commonly known as: FOLVITE   L-Methylfolate 15 MG Tabs   multivitamin with minerals Tabs tablet   naproxen 500 MG tablet Commonly known as: NAPROSYN   Reguloid 400 MG Caps Generic drug: Psyllium   Vitamin D3 50 MCG (2000 UT) Tabs     TAKE these medications   albuterol 108 (90 Base) MCG/ACT inhaler Commonly known as: VENTOLIN HFA Inhale 2 puffs into the  lungs every 6 (six) hours as needed for wheezing or shortness of breath.   azithromycin 500 MG tablet Commonly known as: ZITHROMAX Take 500 mg by mouth See admin instructions. Take 1 tablet (500 mg) by mouth 1 hour prior to dental procedure   clotrimazole-betamethasone cream Commonly known as: LOTRISONE Apply 1 application topically 2 (two) times daily.   diclofenac Sodium 1 % Gel Commonly known as: VOLTAREN Apply 4 g topically 4 (four) times daily. Apply topically scheduled 4 times  daily (0700, 1200, 1700 & 2100) & apply 4 grams to both knees 4 times daily as needed for knee pain   docusate sodium 100 MG capsule Commonly known as: COLACE Take 1 capsule (100 mg total) by mouth 2 (two) times daily. What changed: additional instructions   esomeprazole 40 MG capsule Commonly known as: NEXIUM Take 40 mg by mouth in the morning. (0700)   fluticasone 110 MCG/ACT inhaler Commonly known as: FLOVENT HFA Inhale 1 puff into the lungs 2 (two) times daily. (0700 & 2000)   HYDROcodone-acetaminophen 5-325 MG tablet Commonly known as: NORCO/VICODIN Take 1-2 tablets by mouth every 6 (six) hours as needed for severe pain.   ketotifen 0.025 % ophthalmic solution Commonly known as: ZADITOR Place 1 drop into both eyes 2 (two) times daily. (0700 & 2000)   levonorgestrel-ethinyl estradiol 0.1-20 MG-MCG tablet Commonly known as: ALESSE Take 1 tablet by mouth in the morning. (0700)   levothyroxine 75 MCG tablet Commonly known as: SYNTHROID Take 75 mcg by mouth daily at 6 (six) AM. (0630)   loratadine 10 MG tablet Commonly known as: CLARITIN Take 10 mg by mouth daily as needed for allergies.   methocarbamol 500 MG tablet Commonly known as: ROBAXIN Take 1 tablet (500 mg total) by mouth every 6 (six) hours as needed for muscle spasms.   methotrexate 10 MG tablet Commonly known as: RHEUMATREX Take 7.5-10 mg by mouth See admin instructions. Take 3 tablets (7.5 mg) by mouth in the morning on  Thursdays (0700) & take 4 tablets (10 mg) by mouth in th evening on Thursdays (1700).   montelukast 10 MG tablet Commonly known as: SINGULAIR Take 10 mg by mouth every evening. (1700)   mupirocin ointment 2 % Commonly known as: BACTROBAN Apply 1 application topically in the morning, at noon, and at bedtime. (0800, 1600 & 2000)   OcuSoft Eyelid Cleansing Pads Place 1 application into both eyes in the morning and at bedtime. Apply to Eyelid and eyelash topically two times a day for Dry eyes   potassium chloride 10 MEQ CR capsule Commonly known as: MICRO-K Take 10 mEq by mouth 2 (two) times daily. (0700 & 2000)   pseudoephedrine 30 MG tablet Commonly known as: SUDAFED Take 30 mg by mouth every 4 (four) hours as needed for congestion.   rivaroxaban 10 MG Tabs tablet Commonly known as: XARELTO Take 1 tablet (10 mg total) by mouth daily with breakfast.   sodium chloride 0.65 % nasal spray Commonly known as: OCEAN Place 1 spray into the nose 2 (two) times daily. (0700 & 2000)   traMADol 50 MG tablet Commonly known as: ULTRAM Take 1-2 tablets (50-100 mg total) by mouth every 6 (six) hours as needed for moderate pain.            Discharge Care Instructions  (From admission, onward)         Start     Ordered   04/24/21 0000  Weight bearing as tolerated        04/24/21 0840   04/24/21 0000  Change dressing       Comments: You have an adhesive waterproof bandage over the incision. Leave this in place until your first follow-up appointment. Once you remove this you will not need to place another bandage.   04/24/21 0840          Follow-up Information    Ollen Gross, MD. Schedule an appointment as soon as possible for a visit on 05/06/2021.   Specialty: Orthopedic  Surgery Contact information: [address] [place] [address] [phone]               Signed: Arther Abbott, PA-C Orthopedic Surgery 04/28/2021, 8:03 AM

## 2021-04-29 NOTE — Progress Notes (Signed)
Physical Therapy Treatment Patient Details Name: Marissa Gray MRN: [id number] DOB: [birth year] Today's Date: 04/29/2021    History of Present Illness Patient is a 40 year old woman with Down's Syndrome s/p left THA.    PT Comments    POD # 6 Pt OOB in recliner with Mom in room.  Assisted with amb in hallway then returned to room to perform some THR TE's. Followed by ICE. Pt awaiting placement for ST Rehab before D/C back to her Group Home   Follow Up Recommendations  SNF     Equipment Recommendations  Rolling walker with 5" wheels    Recommendations for Other Services       Precautions / Restrictions Precautions Precautions: Posterior Hip Precaution Comments: Pt recalls all THP but continues to intermettently be cued with attempts to break them Restrictions Weight Bearing Restrictions: No LLE Weight Bearing: Weight bearing as tolerated    Mobility  Bed Mobility               General bed mobility comments: Pt OOB in recliner    Transfers Overall transfer level: Needs assistance Equipment used: Rolling walker (2 wheeled) Transfers: Sit to/from Stand Sit to Stand: Supervision;Min guard         General transfer comment: Min cues for sequence and adherence to THP  Ambulation/Gait Ambulation/Gait assistance: Min guard;Supervision Gait Distance (Feet): 42 Feet Assistive device: Rolling walker (2 wheeled) Gait Pattern/deviations: Step-to pattern;Decreased step length - right;Decreased step length - left;Shuffle;Trunk flexed Gait velocity: decr   General Gait Details: min cues for sequence, posture, position from RW, and adherence to THP on turns.  decreased distance due to increased "tightness"   Stairs             Wheelchair Mobility    Modified Rankin (Stroke Patients Only)       Balance                                            Cognition Arousal/Alertness: Awake/alert Behavior During Therapy: WFL for tasks  assessed/performed Overall Cognitive Status: Within Functional Limits for tasks assessed                                 General Comments: Per prior chart review Mother states has he mental capacity of a 4th grader.  Pleasant and cooperative with good recall/memeory.      Exercises   Total Hip Replacement TE's following HEP Handout 10 reps ankle pumps 05 reps knee presses 05 reps heel slides 05 reps SAQ's 05 reps ABD Instructed how to use a belt loop to assist  Followed by ICE    General Comments        Pertinent Vitals/Pain Pain Assessment: 0-10 Pain Score: 5  Pain Location: L hip Pain Descriptors / Indicators: Tender;Tightness Pain Intervention(s): Monitored during session;Premedicated before session;Repositioned;Ice applied    Home Living                      Prior Function            PT Goals (current goals can now be found in the care plan section) Progress towards PT goals: Progressing toward goals    Frequency    7X/week      PT Plan Current plan remains appropriate  Co-evaluation              AM-PAC PT "6 Clicks" Mobility   Outcome Measure  Help needed turning from your back to your side while in a flat bed without using bedrails?: A Lot Help needed moving from lying on your back to sitting on the side of a flat bed without using bedrails?: A Little Help needed moving to and from a bed to a chair (including a wheelchair)?: A Little Help needed standing up from a chair using your arms (e.g., wheelchair or bedside chair)?: A Little Help needed to walk in hospital room?: A Little Help needed climbing 3-5 steps with a railing? : A Little 6 Click Score: 17    End of Session Equipment Utilized During Treatment: Gait belt Activity Tolerance: Patient tolerated treatment well Patient left: with call bell/phone within reach;with family/visitor present;in chair Nurse Communication: Mobility status PT Visit Diagnosis:  Difficulty in walking, not elsewhere classified (R26.2)     Time: 3016-0109 PT Time Calculation (min) (ACUTE ONLY): 26 min  Charges:  $Gait Training: 8-22 mins $Therapeutic Exercise: 8-22 mins                     Felecia Shelling  PTA Acute  Rehabilitation Services Pager      [phone] Office      [phone]

## 2021-04-29 NOTE — TOC Progression Note (Addendum)
Transition of Care [facility]) - Progression Note    Patient Details  Name: LAZARA GRIESER MRN: [id number] Date of Birth: 01-12-1981  Transition of Care [facility]) CM/SW Contact  Lennart Pall, LCSW Phone Number: 04/29/2021, 12:22 PM  Clinical Narrative:    Met with pt and mother this morning to provide update on SNF bed search.  At this time, there are still no SNF bed offers despite sending her information out to all area facilities and faxing to the facilities in the [facility] area (per mother's request).  The primary barrier is the insurance coverage (Medicaid).   Asked mother about what barriers would be for pt to return to group home and she notes that they do not have supervision available at night (worker onsite sleeping) and they don't feel this would be a safe plan. Mother, also, notes that she does not feel her home layout or her limited ability to assist would allow for a safe dc there.   TOC will continue to work on securing SNF bed.   1445 Addendum: Have received SNF bed offer from [place] and mother has accepted.  Facility to start insurance authorization (must be done by SNF).  Expected Discharge Plan: [place] Barriers to Discharge: [place] (PASRR),SNF Pending bed offer  Expected Discharge Plan and Services Expected Discharge Plan: [place] In-house Referral: Clinical Social Work   Post Acute Care Choice: [place] Living arrangements for the past 2 months: Group Home Expected Discharge Date: 04/29/21               DME Arranged: [facility] DME Agency: [employer]     Representative spoke with at DME Agency: Pre-arranged before surgery             Social Determinants of Health (SDOH) Interventions    Readmission Risk Interventions No flowsheet data found.

## 2021-04-30 ENCOUNTER — Encounter (HOSPITAL_COMMUNITY): Payer: Self-pay | Admitting: Orthopedic Surgery

## 2021-04-30 NOTE — Progress Notes (Signed)
   Subjective: 7 Days Post-Op Procedure(s) (LRB): TOTAL HIP ARTHROPLASTY-Posterior (Left) Patient reports pain as no pain. She has stifness when she first gets up.   Plan is to go Skilled nursing facility after hospital stay.  Objective: Vital signs in last 24 hours: Temp:  [97.6 F (36.4 C)-98.2 F (36.8 C)] 97.6 F (36.4 C) (05/04 0634) Pulse Rate:  [82-86] 82 (05/04 0634) Resp:  [16-18] 18 (05/04 0634) BP: (102-126)/(59-82) 102/64 (05/04 0634) SpO2:  [96 %-100 %] 100 % (05/04 0634)  Intake/Output from previous day:  Intake/Output Summary (Last 24 hours) at 04/30/2021 0648 Last data filed at 04/30/2021 0634 Gray per 24 hour  Intake 900 ml  Output --  Net 900 ml    Intake/Output this shift: Total I/O In: 180 [P.O.:180] Out: -   Labs: No results for input(s): HGB in the last 72 hours. No results for input(s): WBC, RBC, HCT, PLT in the last 72 hours. No results for input(s): NA, K, CL, CO2, BUN, CREATININE, GLUCOSE, CALCIUM in the last 72 hours. No results for input(s): LABPT, INR in the last 72 hours.  EXAM General - Patient is Alert, Appropriate and Oriented Extremity - Incision: dressing C/D/I No cellulitis present Compartment soft Dressing/Incision - clean, dry, no drainage Motor Function - intact, moving foot and toes well on exam.   Past Medical History:  Diagnosis Date  . Asthma   . 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 Closed bicondylar fracture of right tibial plateau 06/20/2017  . 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 Dermatitis    left leg   . Down's syndrome   . GERD (gastroesophageal reflux disease)   . Hashimoto's disease   . Heart murmur   . Hypothyroidism   . Pneumonia    hx of years ago   . Rheumatoid arthritis (HCC)   . Strabismus     Assessment/Plan: 7 Days Post-Op Procedure(s) (LRB): TOTAL HIP ARTHROPLASTY-Posterior (Left) Principal Problem:   OA (osteoarthritis) of hip Active Problems:   Primary  osteoarthritis of left hip   Discharge to SNF   Weight Bearing As Tolerated left Leg  Marissa Gray 04/30/2021, 6:48 AM

## 2021-04-30 NOTE — Progress Notes (Signed)
Physical Therapy Treatment Patient Details Name: Marissa Gray MRN: [id number] DOB: July 25, 1981 Today's Date: 04/30/2021    History of Present Illness Patient is a 40 year old woman with Down's Syndrome s/p left THA.    PT Comments    POD # 7 THR Pt OOB in recliner.  Assisted with amb an increased distance. General Gait Details: min cues for sequence, posture, position from RW, and adherence to THP on turns.  slow but steady.  C/o L knee tightness which decreases with activity.  Then returned to room to perform some TE's following HEP handout.  Instructed on proper tech, freq as well as use of ICE.     Follow Up Recommendations  SNF     Equipment Recommendations  Rolling walker with 5" wheels    Recommendations for Other Services       Precautions / Restrictions Precautions Precautions: Posterior Hip Precaution Booklet Issued: Yes (comment) Precaution Comments: Pt recalls all THP but continues to intermettently be cued with attempts to break them Restrictions Weight Bearing Restrictions: No LLE Weight Bearing: Weight bearing as tolerated    Mobility  Bed Mobility               General bed mobility comments: Pt OOB in recliner    Transfers Overall transfer level: Needs assistance Equipment used: Rolling walker (2 wheeled) Transfers: Sit to/from Stand Sit to Stand: Min guard         General transfer comment: increased time and <25% VC's on proper hand placement  Ambulation/Gait Ambulation/Gait assistance: Min guard;Supervision Gait Distance (Feet): 75 Feet Assistive device: Rolling walker (2 wheeled) Gait Pattern/deviations: Step-to pattern;Decreased step length - right;Decreased step length - left;Shuffle;Trunk flexed Gait velocity: decreased   General Gait Details: min cues for sequence, posture, position from RW, and adherence to THP on turns.  slow but steady.  C/o L knee tightness which decreases with activity   Stairs             Wheelchair  Mobility    Modified Rankin (Stroke Patients Only)       Balance Overall balance assessment: Mild deficits observed, not formally tested                                          Cognition Arousal/Alertness: Awake/alert Behavior During Therapy: WFL for tasks assessed/performed Overall Cognitive Status: Within Functional Limits for tasks assessed                                 General Comments: Per prior chart review Mother states has she mental capacity of a 4th grader.  Pleasant and cooperative with good recall/memeory.      Exercises   Total Hip Replacement TE's following HEP Handout 10 reps ankle pumps 05 reps knee presses 05 reps heel slides 05 reps SAQ's 05 reps ABD Instructed how to use a belt loop to assist  Followed by ICE     General Comments        Pertinent Vitals/Pain Pain Assessment: Faces Faces Pain Scale: Hurts a little bit Pain Location: L hip Pain Descriptors / Indicators: Tender;Tightness Pain Intervention(s): Monitored during session;Repositioned;Ice applied    Home Living                      Prior Function  PT Goals (current goals can now be found in the care plan section) Acute Rehab PT Goals Patient Stated Goal: To be more independent in order to return to Group Home Progress towards PT goals: Progressing toward goals    Frequency    7X/week      PT Plan Current plan remains appropriate    Co-evaluation              AM-PAC PT "6 Clicks" Mobility   Outcome Measure  Help needed turning from your back to your side while in a flat bed without using bedrails?: A Lot Help needed moving from lying on your back to sitting on the side of a flat bed without using bedrails?: A Little Help needed moving to and from a bed to a chair (including a wheelchair)?: A Little Help needed standing up from a chair using your arms (e.g., wheelchair or bedside chair)?: A Little Help needed  to walk in hospital room?: A Little Help needed climbing 3-5 steps with a railing? : A Lot 6 Click Score: 16    End of Session Equipment Utilized During Treatment: Gait belt Activity Tolerance: Patient tolerated treatment well Patient left: with call bell/phone within reach;with family/visitor present;in chair Nurse Communication: Mobility status PT Visit Diagnosis: Difficulty in walking, not elsewhere classified (R26.2)     Time: 0630-1601 PT Time Calculation (min) (ACUTE ONLY): 25 min  Charges:  $Gait Training: 8-22 mins $Therapeutic Exercise: 8-22 mins                     Felecia Shelling  PTA Acute  Rehabilitation Services Pager      [phone] Office      [phone]

## 2021-04-30 NOTE — Progress Notes (Signed)
Occupational Therapy Treatment Patient Details Name: Marissa Gray MRN: [id number] DOB: [birth year] Today's Date: 04/30/2021    History of present illness Patient is a 40 year old woman with Down's Syndrome s/p left THA.   OT comments  Patient overall min G assist for functional transfer with walker to ambulate to sink, patient supervision to perform oral care. Seated patient demo use of reacher and shoe horn for lower body dressing, patient needing min A/cues for safety with hip precautions. Patient progressing well with acute OT goals, hopeful to D/C to rehab today.    Follow Up Recommendations  SNF    Equipment Recommendations  None recommended by OT       Precautions / Restrictions Precautions Precautions: Posterior Hip Precaution Booklet Issued: Yes (comment) Precaution Comments: Pt recalls all THP but continues to intermettently be cued with attempts to break them Restrictions LLE Weight Bearing: Weight bearing as tolerated       Mobility Bed Mobility               General bed mobility comments: Pt OOB in recliner    Transfers Overall transfer level: Needs assistance Equipment used: Rolling walker (2 wheeled) Transfers: Sit to/from Stand Sit to Stand: Min guard         General transfer comment: min G for safety    Balance Overall balance assessment: Mild deficits observed, not formally tested                                         ADL either performed or assessed with clinical judgement   ADL Overall ADL's : Needs assistance/impaired     Grooming: Oral care;Supervision/safety;Standing Grooming Details (indicate cue type and reason): no loss of balance noted, holds sink for support             Lower Body Dressing: Minimal assistance;Sitting/lateral leans;Adhering to hip precautions;With adaptive equipment Lower Body Dressing Details (indicate cue type and reason): patient demonstrates how to use reacher and shoe horn for lower  body dressing needing min cues for technique and to maintain precautions Toilet Transfer: Min guard;Ambulation;RW Toilet Transfer Details (indicate cue type and reason): sit <> stand from recliner in order to perform oral care at sink, min G for safety and increased time to power up to standing         Functional mobility during ADLs: Min guard;Rolling walker General ADL Comments: patient had questions about how to get clothes from drawers at group home as well as what type of furniture to sit in, educate patient and her mom re: modifying clothing set up to easier to reach drawers/asking staff for assist as needed. also to sit on firmer surfaces preferably with hand rests to maximize safety with sit <> stand               Cognition Arousal/Alertness: Awake/alert Behavior During Therapy: [facility] for tasks assessed/performed Overall Cognitive Status: Within Functional Limits for tasks assessed                                 General Comments: Per prior chart review Mother states has she mental capacity of a 4th grader.  Pleasant and cooperative with good recall/memeory.                   Pertinent Vitals/ Pain  Pain Assessment: Faces Faces Pain Scale: Hurts a little bit Pain Location: L hip Pain Descriptors / Indicators: Tender;Tightness Pain Intervention(s): Monitored during session         Frequency  Min 2X/week        Progress Toward Goals  OT Goals(current goals can now be found in the care plan section)  Progress towards OT goals: Progressing toward goals  Acute Rehab OT Goals Patient Stated Goal: To be more independent in order to return to Group Home OT Goal Formulation: With patient/family Time For Goal Achievement: 05/08/21 Potential to Achieve Goals: Good ADL Goals Pt Will Perform Lower Body Dressing: with supervision;with adaptive equipment;sit to/from stand Pt Will Transfer to Toilet: with supervision;ambulating;regular height  toilet;grab bars Pt Will Perform Toileting - Clothing Manipulation and hygiene: with supervision;sit to/from stand Additional ADL Goal #1: Patient will stand at sink to perform grooming task as evidence of improving activity tolerance Additional ADL Goal #2: Patient will consistently follow hip precautions during full treatment prior to discharge  Plan Discharge plan remains appropriate       AM-PAC OT "6 Clicks" Daily Activity     Outcome Measure   Help from another person eating meals?: None Help from another person taking care of personal grooming?: A Little Help from another person toileting, which includes using toliet, bedpan, or urinal?: A Lot Help from another person bathing (including washing, rinsing, drying)?: A Lot Help from another person to put on and taking off regular upper body clothing?: A Little Help from another person to put on and taking off regular lower body clothing?: A Little 6 Click Score: 17    End of Session Equipment Utilized During Treatment: Rolling walker  OT Visit Diagnosis: Other abnormalities of gait and mobility (R26.89);Pain Pain - Right/Left: Left Pain - part of body: Hip   Activity Tolerance Patient tolerated treatment well   Patient Left in chair;with call bell/phone within reach;with family/visitor present   Nurse Communication Mobility status        Time: 7253-6644 OT Time Calculation (min): 30 min  Charges: OT General Charges $OT Visit: 1 Visit OT Treatments $Self Care/Home Management : 23-37 mins  Marlyce Huge OT OT pager: [phone]   Carmelia Roller 04/30/2021, 10:38 AM

## 2021-05-01 NOTE — Plan of Care (Signed)
Plan of care reviewed with the patient and her mom.

## 2021-05-01 NOTE — Progress Notes (Signed)
Physical Therapy Treatment Patient Details Name: Marissa Gray MRN: [id number] DOB: 25-Apr-1981 Today's Date: 05/01/2021    History of Present Illness Patient is a 40 year old woman with Down's Syndrome s/p left THA.    PT Comments    POD # 8 Pt progressing slowly.  General transfer comment: increased time and <25% VC's on proper hand placement.  General Gait Details: decreased amn distance this session due to increased c/o fatigue Then returned to room to perform some standing TE's following HEP handout.  Instructed on proper tech, freq as well as use of ICE.   Pt is from a Group Home and will need ST Rehab at SNF to increase her mobility ability before safely returning.    Follow Up Recommendations  SNF     Equipment Recommendations  Rolling walker with 5" wheels    Recommendations for Other Services       Precautions / Restrictions Precautions Precautions: Posterior Hip Precaution Comments: Pt recalls all THP but continues to intermettently be cued with attempts to break them Restrictions Weight Bearing Restrictions: No LLE Weight Bearing: Weight bearing as tolerated    Mobility  Bed Mobility               General bed mobility comments: Pt OOB in recliner    Transfers Overall transfer level: Needs assistance Equipment used: Rolling walker (2 wheeled) Transfers: Sit to/from Stand Sit to Stand: Min guard         General transfer comment: increased time and <25% VC's on proper hand placement  Ambulation/Gait Ambulation/Gait assistance: Min guard;Supervision Gait Distance (Feet): 45 Feet Assistive device: Rolling walker (2 wheeled) Gait Pattern/deviations: Step-to pattern;Decreased step length - right;Decreased step length - left;Shuffle;Trunk flexed Gait velocity: decreased   General Gait Details: decreased amn distance this session due to increased c/o fatigue   Stairs             Wheelchair Mobility    Modified Rankin (Stroke Patients  Only)       Balance                                            Cognition Arousal/Alertness: Awake/alert Behavior During Therapy: WFL for tasks assessed/performed Overall Cognitive Status: Within Functional Limits for tasks assessed                                 General Comments: Per prior chart review Mother states has she mental capacity of a 4th grader.  Pleasant and cooperative with good recall/memeory.      Exercises  10 reps all standing TE's    General Comments        Pertinent Vitals/Pain Pain Assessment: Faces Faces Pain Scale: Hurts even more Pain Location: L hip Pain Descriptors / Indicators: Tender;Tightness;Operative site guarding Pain Intervention(s): Monitored during session;Repositioned;Ice applied    Home Living                      Prior Function            PT Goals (current goals can now be found in the care plan section) Progress towards PT goals: Progressing toward goals    Frequency    7X/week      PT Plan Current plan remains appropriate    Co-evaluation  AM-PAC PT "6 Clicks" Mobility   Outcome Measure  Help needed turning from your back to your side while in a flat bed without using bedrails?: A Lot Help needed moving from lying on your back to sitting on the side of a flat bed without using bedrails?: A Little Help needed moving to and from a bed to a chair (including a wheelchair)?: A Little Help needed standing up from a chair using your arms (e.g., wheelchair or bedside chair)?: A Little Help needed to walk in hospital room?: A Little   6 Click Score: 14    End of Session Equipment Utilized During Treatment: Gait belt Activity Tolerance: Patient tolerated treatment well Patient left: with call bell/phone within reach;with family/visitor present;in chair Nurse Communication: Mobility status PT Visit Diagnosis: Difficulty in walking, not elsewhere classified  (R26.2)     Time: 7169-6789 PT Time Calculation (min) (ACUTE ONLY): 25 min  Charges:  $Gait Training: 8-22 mins $Therapeutic Exercise: 8-22 mins                     Felecia Shelling  PTA Acute  Rehabilitation Services Pager      [phone] Office      [phone]

## 2021-05-01 NOTE — Progress Notes (Signed)
   Subjective: 8 Days Post-Op Procedure(s) (LRB): TOTAL HIP ARTHROPLASTY-Posterior (Left) Patient reports pain as mild.   Patient seen in rounds by Dr. Lequita Halt. Patient is well, and has had no acute complaints or problems.  No acute overnight events.  Denies SOB, chest pain, or calf pain.  Patient endorses stiffness when first getting up.  She feels like she is a little bit slower when she is moving.  Ambulated 75 feet with physical therapy yesterday. We will continue therapy today.   Objective: Vital signs in last 24 hours: Temp:  [97.7 F (36.5 C)-98.3 F (36.8 C)] 97.9 F (36.6 C) (05/05 0510) Pulse Rate:  [83-86] 85 (05/05 0510) Resp:  [16] 16 (05/05 0510) BP: (100-120)/(51-74) 100/69 (05/05 0510) SpO2:  [96 %-98 %] 96 % (05/05 0510)  Intake/Output from previous day:  Intake/Output Summary (Last 24 hours) at 05/01/2021 0822 Last data filed at 05/01/2021 0510 Gross per 24 hour  Intake 900 ml  Output --  Net 900 ml     Intake/Output this shift: No intake/output data recorded.  Labs: No results for input(s): HGB in the last 72 hours. No results for input(s): WBC, RBC, HCT, PLT in the last 72 hours. No results for input(s): NA, K, CL, CO2, BUN, CREATININE, GLUCOSE, CALCIUM in the last 72 hours. No results for input(s): LABPT, INR in the last 72 hours.  Exam: General - Patient is Alert and Oriented Extremity - Neurologically intact Neurovascular intact Intact pulses distally Dorsiflexion/Plantar flexion intact Dressing - dressing C/D/I Motor Function - intact, moving foot and toes well on exam.   Past Medical History:  Diagnosis Date  . Asthma   . 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 Closed bicondylar fracture of right tibial plateau 06/20/2017  . 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 Dermatitis    left leg   . Down's syndrome   . GERD (gastroesophageal reflux disease)   . Hashimoto's disease   . Heart murmur   . Hypothyroidism   .  Pneumonia    hx of years ago   . Rheumatoid arthritis (HCC)   . Strabismus     Assessment/Plan: 8 Days Post-Op Procedure(s) (LRB): TOTAL HIP ARTHROPLASTY-Posterior (Left) Principal Problem:   OA (osteoarthritis) of hip Active Problems:   Primary osteoarthritis of left hip  Estimated body mass index is 40.08 kg/m as calculated from the following:   Height as of this encounter: 4\' 6"  (1.372 m).   Weight as of this encounter: 75.4 kg. Up with therapy  DVT Prophylaxis - Xarelto and TED hose Weight bearing as tolerated D/C knee immobilizer Hemovac pulled without difficulty Begin therapy Hip precautions discussed with patient  Plan is to go Skilled nursing facility after hospital stay.  Continue physical therapy today.  Awaiting placement to skilled nursing facility.  We will discharge upon placement.  Patient to follow-up with Dr. next Tuesday.  Wednesday, MBA, PA-C Orthopedic Surgery 05/01/2021, 8:22 AM

## 2021-05-02 LAB — SARS CORONAVIRUS 2 (TAT 6-24 HRS): SARS Coronavirus 2: NEGATIVE

## 2021-05-02 NOTE — Plan of Care (Signed)
  Problem: Education: Goal: Knowledge of General Education information will improve Description Including pain rating scale, medication(s)/side effects and non-pharmacologic comfort measures Outcome: Progressing   Problem: Activity: Goal: Risk for activity intolerance will decrease Outcome: Progressing   Problem: Safety: Goal: Ability to remain free from injury will improve Outcome: Progressing   

## 2021-05-02 NOTE — Plan of Care (Signed)
Patient discharged all careplans met  

## 2021-05-02 NOTE — Plan of Care (Signed)
  Problem: Elimination: Goal: Will not experience complications related to bowel motility Outcome: Progressing   Problem: Pain Managment: Goal: General experience of comfort will improve Outcome: Progressing   Problem: Safety: Goal: Ability to remain free from injury will improve Outcome: Progressing   Problem: Education: Goal: Understanding of discharge needs will improve Outcome: Progressing

## 2021-05-02 NOTE — TOC Transition Note (Signed)
Transition of Care [facility]) - CM/SW Discharge Note   Patient Details  Name: Marissa Gray MRN: [id number] Date of Birth: Oct 26, 1981  [facility]) CM/SW Contact:  Amada Jupiter, LCSW Phone Number: 05/02/2021, 11:25 AM   Clinical Narrative:    Alerted late yesterday by Nada Maclachlan that they are no longer able to accept pt.  Have secured another bed at [facility] in [place] and pt and mother have accepted.  Facility can admit today.  PTAR called at 11:20am.  RN to call report to [phone].   Final next level of care: Skilled Nursing Facility Barriers to Discharge: Barriers Resolved   Patient Goals and CMS Choice Patient states their goals for this hospitalization and ongoing recovery are:: SNF rehab and then return to her group home CMS Medicare.gov Compare Post Acute Care list provided to:: Patient Choice offered to / list presented to : Patient,Parent  Discharge Placement              Patient chooses bed at: [facility] and  Rehabilitation Patient to be transferred to facility by: PTAR Name of family member notified: mother Patient and family notified of of transfer: 05/02/21  Discharge Plan and Services In-house Referral: Clinical Social Work   Post Acute Care Choice: Skilled Nursing Facility          DME Arranged: N/A DME Agency: NA     Representative spoke with at DME Agency: Pre-arranged before surgery            Social Determinants of Health (SDOH) Interventions     Readmission Risk Interventions No flowsheet data found.

## 2021-05-02 NOTE — Progress Notes (Signed)
Physical Therapy Treatment Patient Details Name: Marissa Gray MRN: [id number] DOB: [birth year] Today's Date: 05/02/2021    History of Present Illness Patient is a 40 year old woman with Down's Syndrome s/p left THA.    PT Comments    POD # 8  Pt was OOB in recliner.  Performed a few TE's then assisted with amb in hallway when PTAR arrived to transport pt to her facility for ST Rehab.    Follow Up Recommendations  SNF     Equipment Recommendations  Rolling walker with 5" wheels    Recommendations for Other Services       Precautions / Restrictions Precautions Precautions: Posterior Hip Precaution Comments: Pt recalls all THP but continues to intermettently be cued with attempts to break them Restrictions Weight Bearing Restrictions: No LLE Weight Bearing: Weight bearing as tolerated    Mobility  Bed Mobility               General bed mobility comments: Pt OOB in recliner    Transfers Overall transfer level: Needs assistance Equipment used: Rolling walker (2 wheeled) Transfers: Sit to/from Stand Sit to Stand: Min guard         General transfer comment: increased time and <25% VC's on proper hand placement  Ambulation/Gait Ambulation/Gait assistance: Min guard;Supervision Gait Distance (Feet): 12 Feet Assistive device: Rolling walker (2 wheeled) Gait Pattern/deviations: Step-to pattern;Decreased step length - right;Decreased step length - left;Shuffle;Trunk flexed Gait velocity: decreased   General Gait Details: decreased amn distance this session due to increased c/o fatigue and c/o B UE fatigue (from using walker)   Stairs             Wheelchair Mobility    Modified Rankin (Stroke Patients Only)       Balance                                            Cognition Arousal/Alertness: Awake/alert Behavior During Therapy: WFL for tasks assessed/performed Overall Cognitive Status: Within Functional Limits for tasks  assessed                                 General Comments: Per prior chart review Mother states has she mental capacity of a 4th grader.  Pleasant and cooperative with good recall/memeory.      Exercises   10 reps AP, knee presses and LAQ's   General Comments        Pertinent Vitals/Pain Pain Assessment: Faces Faces Pain Scale: Hurts a little bit Pain Location: L hip Pain Descriptors / Indicators: Tender;Tightness;Operative site guarding Pain Intervention(s): Monitored during session;Premedicated before session;Repositioned;Patient requesting pain meds-RN notified    Home Living                      Prior Function            PT Goals (current goals can now be found in the care plan section) Progress towards PT goals: Progressing toward goals    Frequency    7X/week      PT Plan Current plan remains appropriate    Co-evaluation              AM-PAC PT "6 Clicks" Mobility   Outcome Measure  Help needed turning from your back to your side while in  a flat bed without using bedrails?: A Lot Help needed moving from lying on your back to sitting on the side of a flat bed without using bedrails?: A Little Help needed moving to and from a bed to a chair (including a wheelchair)?: A Little Help needed standing up from a chair using your arms (e.g., wheelchair or bedside chair)?: A Little Help needed to walk in hospital room?: A Little Help needed climbing 3-5 steps with a railing? : A Lot 6 Click Score: 16    End of Session Equipment Utilized During Treatment: Gait belt Activity Tolerance: Patient tolerated treatment well Patient left: with call bell/phone within reach;with family/visitor present;Other (comment) Teaching laboratory technician) Nurse Communication: Mobility status PT Visit Diagnosis: Difficulty in walking, not elsewhere classified (R26.2)     Time: 4709-6283 PT Time Calculation (min) (ACUTE ONLY): 26 min  Charges:  $Gait Training: 8-22  mins $Therapeutic Activity: 8-22 mins                     Felecia Shelling  PTA Acute  Rehabilitation Services Pager      [phone] Office      [phone]

## 2021-05-05 NOTE — Anesthesia Postprocedure Evaluation (Signed)
Anesthesia Post Note  Patient: Marissa Gray  Procedure(s) Performed: TOTAL HIP ARTHROPLASTY-Posterior (Left Hip)     Patient location during evaluation: PACU Anesthesia Type: General Level of consciousness: sedated and patient cooperative Pain management: pain level controlled Vital Signs Assessment: post-procedure vital signs reviewed and stable Respiratory status: spontaneous breathing Cardiovascular status: stable Anesthetic complications: no   No complications documented.  Last Vitals:  Vitals:   05/01/21 2153 05/02/21 0601  BP: 137/71 119/65  Pulse: 87 80  Resp: 18 17  Temp: 36.4 C 36.4 C  SpO2: 98% 97%    Last Pain:  Vitals:   05/02/21 0854  TempSrc:   PainSc: 2                  Lewie Loron

## 2021-05-17 ENCOUNTER — Inpatient Hospital Stay (HOSPITAL_COMMUNITY)
Admission: EM | Admit: 2021-05-17 | Discharge: 2021-05-23 | DRG: 857 | Disposition: A | Discharge status: Skilled Nursing Facility | Payer: Medicaid Other | Source: Other Acute Inpatient Hospital | Attending: Orthopedic Surgery | Admitting: Orthopedic Surgery

## 2021-05-17 ENCOUNTER — Other Ambulatory Visit: Payer: Self-pay

## 2021-05-17 DIAGNOSIS — D62 Acute posthemorrhagic anemia: Secondary | ICD-10-CM | POA: Diagnosis not present

## 2021-05-17 DIAGNOSIS — B9562 Methicillin resistant Staphylococcus aureus infection as the cause of diseases classified elsewhere: Secondary | ICD-10-CM | POA: Diagnosis present

## 2021-05-17 DIAGNOSIS — Y792 Prosthetic and other implants, materials and accessory orthopedic devices associated with adverse incidents: Secondary | ICD-10-CM | POA: Diagnosis present

## 2021-05-17 DIAGNOSIS — Z882 Allergy status to sulfonamides status: Secondary | ICD-10-CM | POA: Diagnosis not present

## 2021-05-17 DIAGNOSIS — L0390 Cellulitis, unspecified: Secondary | ICD-10-CM | POA: Diagnosis present

## 2021-05-17 DIAGNOSIS — Z20822 Contact with and (suspected) exposure to covid-19: Secondary | ICD-10-CM | POA: Diagnosis present

## 2021-05-17 DIAGNOSIS — Z7901 Long term (current) use of anticoagulants: Secondary | ICD-10-CM

## 2021-05-17 DIAGNOSIS — E669 Obesity, unspecified: Secondary | ICD-10-CM | POA: Diagnosis present

## 2021-05-17 DIAGNOSIS — T8149XA Infection following a procedure, other surgical site, initial encounter: Secondary | ICD-10-CM

## 2021-05-17 DIAGNOSIS — Z6841 Body Mass Index (BMI) 40.0 and over, adult: Secondary | ICD-10-CM | POA: Diagnosis not present

## 2021-05-17 DIAGNOSIS — Z880 Allergy status to penicillin: Secondary | ICD-10-CM | POA: Diagnosis not present

## 2021-05-17 DIAGNOSIS — Z96642 Presence of left artificial hip joint: Secondary | ICD-10-CM | POA: Diagnosis present

## 2021-05-17 DIAGNOSIS — D849 Immunodeficiency, unspecified: Secondary | ICD-10-CM | POA: Diagnosis present

## 2021-05-17 DIAGNOSIS — Z888 Allergy status to other drugs, medicaments and biological substances status: Secondary | ICD-10-CM | POA: Diagnosis not present

## 2021-05-17 DIAGNOSIS — E063 Autoimmune thyroiditis: Secondary | ICD-10-CM | POA: Diagnosis present

## 2021-05-17 DIAGNOSIS — J45909 Unspecified asthma, uncomplicated: Secondary | ICD-10-CM | POA: Diagnosis present

## 2021-05-17 DIAGNOSIS — M069 Rheumatoid arthritis, unspecified: Secondary | ICD-10-CM | POA: Diagnosis present

## 2021-05-17 DIAGNOSIS — R627 Adult failure to thrive: Secondary | ICD-10-CM | POA: Diagnosis present

## 2021-05-17 DIAGNOSIS — E039 Hypothyroidism, unspecified: Secondary | ICD-10-CM | POA: Diagnosis not present

## 2021-05-17 DIAGNOSIS — Q909 Down syndrome, unspecified: Secondary | ICD-10-CM | POA: Diagnosis not present

## 2021-05-17 DIAGNOSIS — Z7982 Long term (current) use of aspirin: Secondary | ICD-10-CM

## 2021-05-17 DIAGNOSIS — T8140XA Infection following a procedure, unspecified, initial encounter: Secondary | ICD-10-CM | POA: Diagnosis present

## 2021-05-17 DIAGNOSIS — T8141XA Infection following a procedure, superficial incisional surgical site, initial encounter: Principal | ICD-10-CM | POA: Diagnosis present

## 2021-05-17 DIAGNOSIS — E8809 Other disorders of plasma-protein metabolism, not elsewhere classified: Secondary | ICD-10-CM | POA: Diagnosis present

## 2021-05-17 DIAGNOSIS — K219 Gastro-esophageal reflux disease without esophagitis: Secondary | ICD-10-CM | POA: Diagnosis present

## 2021-05-17 DIAGNOSIS — J4540 Moderate persistent asthma, uncomplicated: Secondary | ICD-10-CM | POA: Diagnosis not present

## 2021-05-17 DIAGNOSIS — Z79899 Other long term (current) drug therapy: Secondary | ICD-10-CM | POA: Diagnosis not present

## 2021-05-17 MED ORDER — OXYCODONE-ACETAMINOPHEN 5-325 MG PO TABS
1.0000 | ORAL_TABLET | ORAL | Status: DC | PRN
  Administered 2021-05-18 – 2021-05-23 (×12): 1 via ORAL
  Filled 2021-05-17 (×12): qty 1

## 2021-05-17 MED ORDER — MORPHINE SULFATE (PF) 2 MG/ML IV SOLN: 2.0000 mg | INTRAVENOUS | Status: DC | PRN

## 2021-05-17 NOTE — ED Provider Notes (Signed)
[facility]   CSN: [id number] Arrival date & time: 05/17/21  2049     History Chief Complaint  Patient presents with  . Post-op Problem    Marissa Gray is a 40 y.o. female.  The history is provided by the patient, a parent and medical records.   Marissa Gray is a 40 y.o. female who presents to the Emergency Department complaining of wound problem. She had a hip replacement performed on April 27. On May 8 she had drainage that quickly resolved. Yesterday she started having recurrent drainage. Today the drainage increased and she developed redness and skin changes to the inferior portion of the wound. The fluid is also starting to look more yellow today. She has no associated fever, chest pain, shortness of breath, nausea, vomiting. He was initially evaluated at a no bond facility and treated with vancomycin. She was transferred to the emergency department here at the request of orthopedics for further evaluation.  Has previously been on methotrexate - has not taken since 4/21.  She is taking Xarelto for DVT ppx.      Past Medical History:  Diagnosis Date  . Asthma   . 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 Closed bicondylar fracture of right tibial plateau 06/20/2017  . 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 Dermatitis    left leg   . Down's syndrome   . GERD (gastroesophageal reflux disease)   . Hashimoto's disease   . Heart murmur   . Hypothyroidism   . Pneumonia    hx of years ago   . Rheumatoid arthritis (HCC)   . Strabismus     Patient Active Problem List   Diagnosis Date Noted  . Post-operative infection 05/17/2021  . OA (osteoarthritis) of hip 04/23/2021  . Primary osteoarthritis of left hip 04/23/2021  . Hashimoto's thyroiditis 09/12/2017  . Nodular thyroid disease 08/03/2017  . Chronic pain of left knee 07/04/2017  . Moderate asthma without complication 06/28/2017  . Hypokalemia  06/28/2017  . Constipation 06/28/2017  . 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 Gastroesophageal reflux disease without esophagitis   . 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 Down's syndrome   . Rheumatoid arthritis involving multiple sites (HCC)   . Closed bicondylar fracture of right tibial plateau 06/20/2017    Past Surgical History:  Procedure Laterality Date  . CARDIAC SURGERY     ASD and vSD repair   . FOOT ARTHRODESIS, TRIPLE     bilateral   . ORIF TIBIA PLATEAU Right 06/22/2017   Procedure: OPEN REDUCTION INTERNAL FIXATION (ORIF) TIBIAL PLATEAU;  Surgeon: Myrene Galas, MD;  Location: [facility] OR;  Service: Orthopedics;  Laterality: Right;  . STRABISMUS SURGERY    . TOTAL HIP ARTHROPLASTY Left 04/23/2021   Procedure: TOTAL HIP ARTHROPLASTY-Posterior;  Surgeon: Ollen Gross, MD;  Location: [facility] ORS;  Service: Orthopedics;  Laterality: Left;   . ulcer surgery        OB History   No obstetric history on file.     No family history on file.  Social History   Tobacco Use  . Smoking status: Never Smoker  . Smokeless tobacco: Never Used  Vaping Use  . Vaping Use: Never used  Substance Use Topics  . Alcohol use: No  . Drug use: No    Home Medications Prior to Admission medications   Medication Sig Start Date End Date Taking? Authorizing Provider  aspirin 81 MG chewable  tablet Chew 81 mg by mouth daily. For heart health for 3 weeks   Yes [provider]  clotrimazole-betamethasone (LOTRISONE) cream Apply 1 application topically 2 (two) times daily.   Yes [provider]  esomeprazole (NEXIUM) 40 MG capsule Take 40 mg by mouth in the morning. (0700)   Yes [provider]  Eyelid Cleansers (OCUSOFT EYELID CLEANSING) PADS Place 1 application into both eyes in the morning and at bedtime. Apply to Eyelid and eyelash topically two times a day for Dry eyes   Yes [provider]  folic acid (FOLVITE) 1 MG  tablet Take 1 mg by mouth in the morning. (0700)   Yes [provider]  HYDROcodone-acetaminophen (NORCO/VICODIN) 5-325 MG tablet Take 1-2 tablets by mouth every 6 (six) hours as needed for severe pain. 04/24/21  Yes Nelia Shi D, PA-C  levonorgestrel-ethinyl estradiol (ALESSE) 0.1-20 MG-MCG tablet Take 1 tablet by mouth in the morning. (0700)   Yes [provider]  loratadine (CLARITIN) 10 MG tablet Take 10 mg by mouth daily as needed for allergies.   Yes [provider]  methocarbamol (ROBAXIN) 500 MG tablet Take 1 tablet (500 mg total) by mouth every 6 (six) hours as needed for muscle spasms. 04/24/21  Yes Nelia Shi D, PA-C  potassium chloride (MICRO-K) 10 MEQ CR capsule Take 10 mEq by mouth 2 (two) times daily. (0700 & 2000)   Yes [provider]  rivaroxaban (XARELTO) 10 MG TABS tablet Take 1 tablet (10 mg total) by mouth daily with breakfast. 04/24/21  Yes Nelia Shi D, PA-C  sodium chloride (OCEAN) 0.65 % nasal spray Place 1 spray into the nose 2 (two) times daily. (0700 & 2000)   Yes [provider]    Allergies    Cefaclor, Penicillins, and Sulfa antibiotics  Review of Systems   Review of Systems  All other systems reviewed and are negative.   Physical Exam Updated Vital Signs BP (!) 103/50   Pulse 93   Temp 98 F (36.7 C) (Oral)   SpO2 96%   Physical Exam Vitals and nursing note reviewed.  Constitutional:      Appearance: She is well-developed.  HENT:     Head: Normocephalic and atraumatic.  Cardiovascular:     Rate and Rhythm: Normal rate and regular rhythm.     Heart sounds: No murmur heard.   Pulmonary:     Effort: Pulmonary effort is normal. No respiratory distress.     Breath sounds: Normal breath sounds.  Abdominal:     Palpations: Abdomen is soft.     Tenderness: There is no abdominal tenderness. There is no guarding or rebound.  Musculoskeletal:        General: Swelling present. No tenderness.      Comments: There is swelling and erythema to the left hip wound with scant serous drainage, mild local tenderness to palpation.      Skin:    General: Skin is warm and dry.  Neurological:     Mental Status: She is alert and oriented to person, place, and time.  Psychiatric:        Behavior: Behavior normal.     ED Results / Procedures / Treatments   Labs (all labs ordered are listed, but only abnormal results are displayed) Labs Reviewed  SARS CORONAVIRUS 2 (TAT 6-24 HRS)  SEDIMENTATION RATE  C-REACTIVE PROTEIN  CBC WITH DIFFERENTIAL/PLATELET  COMPREHENSIVE METABOLIC PANEL  I-STAT BETA HCG BLOOD, ED ([facility], [facility], [facility] ONLY)  TYPE AND  SCREEN    EKG None  Radiology No results found.  Procedures Procedures   Medications Ordered in ED Medications  oxyCODONE-acetaminophen (PERCOCET/ROXICET) 5-325 MG per tablet 1 tablet (has no administration in time range)    Or  morphine 2 MG/ML injection 2 mg (has no administration in time range)    ED Course  I have reviewed the triage vital signs and the nursing notes.  Pertinent labs & imaging results that were available during my care of the patient were reviewed by me and considered in my medical decision making (see chart for details).    MDM Rules/Calculators/A&P                         patient referred to the emergency department for hip wound. She has been treated with antibiotics prior to ED presentation. Dr. Linna Caprice would like admission to the medicine service, NPO after midnight for aspiration of the area tomorrow by IR. Hospitalist consulted for admission.    Final Clinical Impression(s) / ED Diagnoses Final diagnoses:  Surgical site infection  Status post total hip replacement, left    Rx / DC Orders ED Discharge Orders    None       Tilden Fossa, MD 05/17/21 2355

## 2021-05-17 NOTE — ED Notes (Signed)
Paged Dr. Linna Caprice

## 2021-05-17 NOTE — ED Triage Notes (Signed)
Pt had left hip surgery 4/27 at [facility], has been in rehab and wound nurse noticed redness and brown drainage from post op site today.  Pt sent to [facility] and transferred here.

## 2021-05-17 NOTE — Progress Notes (Addendum)
Patient transferred from outside ED for SSI following L THA by Dr. Wynelle Link 04/23/2021. Afebrile VSS. WBC, ESR, CRP pending. Pictures of wound noted. Requested [facility] consult for admission & medical management. I have ordered fluoro guided left hip aspiration: send fluid for cell count with diff, gram stain & culture. Hold abx until aspiration performed. Formal consult and surgical plan to follow.

## 2021-05-18 ENCOUNTER — Encounter (HOSPITAL_COMMUNITY): Payer: Self-pay | Admitting: Internal Medicine

## 2021-05-18 ENCOUNTER — Inpatient Hospital Stay (HOSPITAL_COMMUNITY): Payer: Medicaid Other

## 2021-05-18 LAB — CBC WITH DIFFERENTIAL/PLATELET
Abs Immature Granulocytes: 0.04 10*3/uL (ref 0.00–0.07)
Basophils Absolute: 0 10*3/uL (ref 0.0–0.1)
Basophils Relative: 0 %
Eosinophils Absolute: 0.1 10*3/uL (ref 0.0–0.5)
Eosinophils Relative: 2 %
HCT: 30.5 % — ABNORMAL LOW (ref 36.0–46.0)
Hemoglobin: 9.7 g/dL — ABNORMAL LOW (ref 12.0–15.0)
Immature Granulocytes: 1 %
Lymphocytes Relative: 18 %
Lymphs Abs: 1.3 10*3/uL (ref 0.7–4.0)
MCH: 34.4 pg — ABNORMAL HIGH (ref 26.0–34.0)
MCHC: 31.8 g/dL (ref 30.0–36.0)
MCV: 108.2 fL — ABNORMAL HIGH (ref 80.0–100.0)
Monocytes Absolute: 0.4 10*3/uL (ref 0.1–1.0)
Monocytes Relative: 5 %
Neutro Abs: 5.3 10*3/uL (ref 1.7–7.7)
Neutrophils Relative %: 74 %
Platelets: 486 10*3/uL — ABNORMAL HIGH (ref 150–400)
RBC: 2.82 MIL/uL — ABNORMAL LOW (ref 3.87–5.11)
RDW: 13.7 % (ref 11.5–15.5)
WBC: 7.2 10*3/uL (ref 4.0–10.5)
nRBC: 0 % (ref 0.0–0.2)

## 2021-05-18 LAB — COMPREHENSIVE METABOLIC PANEL
ALT: 18 U/L (ref 0–44)
AST: 16 U/L (ref 15–41)
Albumin: 2.4 g/dL — ABNORMAL LOW (ref 3.5–5.0)
Alkaline Phosphatase: 203 U/L — ABNORMAL HIGH (ref 38–126)
Anion gap: 6 (ref 5–15)
BUN: 9 mg/dL (ref 6–20)
CO2: 24 mmol/L (ref 22–32)
Calcium: 8.1 mg/dL — ABNORMAL LOW (ref 8.9–10.3)
Chloride: 109 mmol/L (ref 98–111)
Creatinine, Ser: 0.57 mg/dL (ref 0.44–1.00)
GFR, Estimated: >60 mL/min (ref >60)
Glucose, Bld: 91 mg/dL (ref 70–99)
Potassium: 4 mmol/L (ref 3.5–5.1)
Sodium: 139 mmol/L (ref 135–145)
Total Bilirubin: 0.5 mg/dL (ref 0.3–1.2)
Total Protein: 6.8 g/dL (ref 6.5–8.1)

## 2021-05-18 LAB — SYNOVIAL CELL COUNT + DIFF, W/ CRYSTALS
Crystals, Fluid: NONE SEEN
Eosinophils-Synovial: 0 % (ref 0–1)
Lymphocytes-Synovial Fld: 12 % (ref 0–20)
Monocyte-Macrophage-Synovial Fluid: 10 % — ABNORMAL LOW (ref 50–90)
Neutrophil, Synovial: 78 % — ABNORMAL HIGH (ref 0–25)
WBC, Synovial: 2040 /mm3 — ABNORMAL HIGH (ref 0–200)

## 2021-05-18 LAB — PROTIME-INR
INR: 1.2 (ref 0.8–1.2)
Prothrombin Time: 15 seconds (ref 11.4–15.2)

## 2021-05-18 LAB — APTT: aPTT: 37 seconds — ABNORMAL HIGH (ref 24–36)

## 2021-05-18 LAB — SARS CORONAVIRUS 2 (TAT 6-24 HRS): SARS Coronavirus 2: NEGATIVE

## 2021-05-18 LAB — HIV ANTIBODY (ROUTINE TESTING W REFLEX): HIV Screen 4th Generation wRfx: NONREACTIVE

## 2021-05-18 LAB — MRSA PCR SCREENING: MRSA by PCR: POSITIVE — AB

## 2021-05-18 LAB — SEDIMENTATION RATE: Sed Rate: 137 mm/hr — ABNORMAL HIGH (ref 0–22)

## 2021-05-18 LAB — C-REACTIVE PROTEIN: CRP: 22.5 mg/dL — ABNORMAL HIGH (ref <1.0)

## 2021-05-18 LAB — I-STAT BETA HCG BLOOD, ED (MC, WL, AP ONLY): I-stat hCG, quantitative: <5 m[IU]/mL (ref <5)

## 2021-05-18 MED ORDER — LORATADINE 10 MG PO TABS: 10.0000 mg | ORAL_TABLET | Freq: Every day | ORAL | Status: DC | PRN

## 2021-05-18 MED ORDER — POLYETHYLENE GLYCOL 3350 17 G PO PACK: 17.0000 g | PACK | Freq: Every day | ORAL | Status: DC | PRN

## 2021-05-18 MED ORDER — ACETAMINOPHEN 325 MG PO TABS: 650.0000 mg | ORAL_TABLET | ORAL | Status: DC | PRN

## 2021-05-18 MED ORDER — SODIUM CHLORIDE 0.9 % IV SOLN
1.0000 g | Freq: Three times a day (TID) | INTRAVENOUS | Status: DC
  Administered 2021-05-18 – 2021-05-21 (×9): 1 g via INTRAVENOUS
  Filled 2021-05-18 (×10): qty 1

## 2021-05-18 MED ORDER — FOLIC ACID 1 MG PO TABS
1.0000 mg | ORAL_TABLET | Freq: Every day | ORAL | Status: DC
  Administered 2021-05-18 – 2021-05-23 (×6): 1 mg via ORAL
  Filled 2021-05-18 (×6): qty 1

## 2021-05-18 MED ORDER — VANCOMYCIN HCL IN DEXTROSE 1-5 GM/200ML-% IV SOLN: 1000.0000 mg | Freq: Once | INTRAVENOUS | Status: DC

## 2021-05-18 MED ORDER — SALINE SPRAY 0.65 % NA SOLN
1.0000 | Freq: Two times a day (BID) | NASAL | Status: DC
  Administered 2021-05-18 – 2021-05-23 (×11): 1 via NASAL
  Filled 2021-05-18: qty 44

## 2021-05-18 MED ORDER — METHOCARBAMOL 500 MG PO TABS
500.0000 mg | ORAL_TABLET | Freq: Four times a day (QID) | ORAL | Status: DC | PRN
  Administered 2021-05-18 – 2021-05-23 (×5): 500 mg via ORAL
  Filled 2021-05-18 (×6): qty 1

## 2021-05-18 MED ORDER — RIVAROXABAN 10 MG PO TABS: 10.0000 mg | ORAL_TABLET | Freq: Every day | ORAL | Status: DC

## 2021-05-18 MED ORDER — ONDANSETRON HCL 4 MG/2ML IJ SOLN: 4.0000 mg | Freq: Four times a day (QID) | INTRAMUSCULAR | Status: DC | PRN

## 2021-05-18 MED ORDER — CHLORHEXIDINE GLUCONATE CLOTH 2 % EX PADS
6.0000 | MEDICATED_PAD | Freq: Every day | CUTANEOUS | Status: DC
  Administered 2021-05-19: 6 via TOPICAL

## 2021-05-18 MED ORDER — SODIUM CHLORIDE 0.9 % IV SOLN: INTRAVENOUS | Status: DC

## 2021-05-18 MED ORDER — LIP MEDEX EX OINT
TOPICAL_OINTMENT | CUTANEOUS | Status: AC
  Administered 2021-05-18: 1 via TOPICAL
  Filled 2021-05-18: qty 7

## 2021-05-18 MED ORDER — LIDOCAINE HCL (PF) 1 % IJ SOLN
INTRAMUSCULAR | Status: AC
  Filled 2021-05-18: qty 5

## 2021-05-18 MED ORDER — MUPIROCIN 2 % EX OINT
1.0000 "application " | TOPICAL_OINTMENT | Freq: Two times a day (BID) | CUTANEOUS | Status: DC
  Administered 2021-05-18 – 2021-05-19 (×2): 1 via NASAL
  Filled 2021-05-18: qty 22

## 2021-05-18 MED ORDER — VANCOMYCIN HCL 750 MG/150ML IV SOLN
750.0000 mg | INTRAVENOUS | Status: DC
  Administered 2021-05-18 – 2021-05-22 (×5): 750 mg via INTRAVENOUS
  Filled 2021-05-18 (×5): qty 150

## 2021-05-18 MED ORDER — PANTOPRAZOLE SODIUM 40 MG PO TBEC
40.0000 mg | DELAYED_RELEASE_TABLET | Freq: Every day | ORAL | Status: DC
  Administered 2021-05-18 – 2021-05-23 (×6): 40 mg via ORAL
  Filled 2021-05-18 (×6): qty 1

## 2021-05-18 NOTE — H&P (Signed)
History and Physical    Marissa Gray:[id number] DOB: [birth year] DOA: 05/17/2021  PCP: Merri Brunette, MD  Patient coming from: [facility] in [place]   Chief Complaint:  Chief Complaint  Patient presents with  . Post-op Problem     HPI:    40 year old female with past medical history of Down syndrome, hypothyroidism, asthma, gastroesophageal reflux disease, rheumatoid arthritis and recent left hip total arthroplasty on 4/27 who presents to [facility] long emergency department as a transfer from [facility] emergency department due to increasing drainage and tenderness at the surgical site.  Patient is a somewhat poor historian and therefore majority history is obtained from the mother who is at the bedside.  Patient's mother explains that the patient has been participating with physical therapy at [facility] rehabilitation and has been making gains ever since being admitted there several weeks ago postoperatively.  Yesterday, patient suddenly was found to have a increasing drainage coming from the wound.  This drainage was associated with rapid progression of redness surrounding the wound, seeming to extend down the thigh towards the knee.  These findings rapidly progressed throughout the day and were associated with significant pain.  The patient reports that she has been experiencing substantial moderate to severe pain of the left thigh particularly surrounding the surgical wound.  She states that the pain is worse with movement or palpation of the area.  Due to this concerning progression of significant drainage and redness of the area the patient was sent to [facility] health emergency department for evaluation.  Upon evaluation in the emergency department emergency department staff discussed the case with Dr. Linna Caprice with orthopedic surgery at [facility] health who recommended the patient be transferred from ED to ED with the  hospitalist group to admit the patient and orthopedic surgery to consult.  Tentative plan is for patient to undergo IR guided drainage of the area.  Upon arrival to [facility] emergency department the hospitalist group is now been called to assess patient for admission to the hospital.  Review of Systems:   Review of Systems  Unable to perform ROS: Mental acuity    Past Medical History:  Diagnosis Date  . Asthma   . 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 Closed bicondylar fracture of right tibial plateau 06/20/2017  . 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 Dermatitis    left leg   . Down's syndrome   . GERD (gastroesophageal reflux disease)   . Hashimoto's disease   . Heart murmur   . Hypothyroidism   . Pneumonia    hx of years ago   . Rheumatoid arthritis (HCC)   . Strabismus     Past Surgical History:  Procedure Laterality Date  . CARDIAC SURGERY     ASD and vSD repair   . FOOT ARTHRODESIS, TRIPLE     bilateral   . ORIF TIBIA PLATEAU Right 06/22/2017   Procedure: OPEN REDUCTION INTERNAL FIXATION (ORIF) TIBIAL PLATEAU;  Surgeon: Myrene Galas, MD;  Location: [facility] OR;  Service: Orthopedics;  Laterality: Right;  . STRABISMUS SURGERY    . TOTAL HIP ARTHROPLASTY Left 04/23/2021   Procedure: TOTAL HIP ARTHROPLASTY-Posterior;  Surgeon: Ollen Gross, MD;  Location: [facility] ORS;  Service: Orthopedics;  Laterality: Left;   . ulcer surgery        reports that she has never smoked. She has never used smokeless tobacco. She reports that she does not drink alcohol and  does not use drugs.  Allergies  Allergen Reactions  . Cefaclor Hives and Rash    Other reaction(s): Unknown  . Penicillins Hives     Has patient had a PCN reaction causing immediate rash, facial/tongue/throat swelling, SOB or lightheadedness with hypotension: Unknown Has patient had a PCN reaction causing severe rash involving mucus membranes or skin necrosis: Unknown Has  patient had a PCN reaction that required hospitalization: Unknown Has patient had a PCN reaction occurring within the last 10 years: Unknown If all of the above answers are "NO", then may proceed with Cephalosporin use. Other reaction(s): rash, Unknown  . Sulfa Antibiotics Hives and Other (See Comments)    FEVER Other reaction(s): Unknown    Family History  Problem Relation Age of Onset  . Heart disease Neg Hx      Prior to Admission medications   Medication Sig Start Date End Date Taking? Authorizing Provider  aspirin 81 MG chewable tablet Chew 81 mg by mouth daily. For heart health for 3 weeks   Yes [provider]  clotrimazole-betamethasone (LOTRISONE) cream Apply 1 application topically 2 (two) times daily.   Yes [provider]  esomeprazole (NEXIUM) 40 MG capsule Take 40 mg by mouth in the morning. (0700)   Yes [provider]  Eyelid Cleansers (OCUSOFT EYELID CLEANSING) PADS Place 1 application into both eyes in the morning and at bedtime. Apply to Eyelid and eyelash topically two times a day for Dry eyes   Yes [provider]  folic acid (FOLVITE) 1 MG tablet Take 1 mg by mouth in the morning. (0700)   Yes [provider]  HYDROcodone-acetaminophen (NORCO/VICODIN) 5-325 MG tablet Take 1-2 tablets by mouth every 6 (six) hours as needed for severe pain. 04/24/21  Yes Nelia Shi D, PA-C  levonorgestrel-ethinyl estradiol (ALESSE) 0.1-20 MG-MCG tablet Take 1 tablet by mouth in the morning. (0700)   Yes [provider]  loratadine (CLARITIN) 10 MG tablet Take 10 mg by mouth daily as needed for allergies.   Yes [provider]  methocarbamol (ROBAXIN) 500 MG tablet Take 1 tablet (500 mg total) by mouth every 6 (six) hours as needed for muscle spasms. 04/24/21  Yes Nelia Shi D, PA-C  potassium chloride (MICRO-K) 10 MEQ CR capsule Take 10 mEq by mouth 2 (two) times daily. (0700 & 2000)   Yes [provider]   rivaroxaban (XARELTO) 10 MG TABS tablet Take 1 tablet (10 mg total) by mouth daily with breakfast. 04/24/21  Yes Nelia Shi D, PA-C  sodium chloride (OCEAN) 0.65 % nasal spray Place 1 spray into the nose 2 (two) times daily. (0700 & 2000)   Yes [provider]    Physical Exam: Vitals:   05/17/21 2100 05/17/21 2106 05/17/21 2215  BP: 113/70  (!) 103/50  Pulse: 97  93  Temp:  98 F (36.7 C)   TempSrc:  Oral   SpO2: 100%  96%    Constitutional: Awake alert and oriented, no associated distress.   Skin: Significant redness and induration noted around the left hip surgical wound.  Surgical wound itself is exhibiting a significant amount of purulent drainage from the surgical site without foul smell.  There is also a significant amount of serosanguineous drainage coming from the wound as well.  2 small notable blisters noted to be near surgical incision without associated crepitus.   Eyes: Pupils are equally reactive to light.  No evidence of scleral icterus or conjunctival pallor.  ENMT: Moist mucous membranes  noted.  Posterior pharynx clear of any exudate or lesions.   Neck: normal, supple, no masses, no thyromegaly.  No evidence of jugular venous distension.   Respiratory: clear to auscultation bilaterally, no wheezing, no crackles. Normal respiratory effort. No accessory muscle use.  Cardiovascular: Regular rate and rhythm, no murmurs / rubs / gallops. No extremity edema. 2+ pedal pulses. No carotid bruits.  Chest:   Nontender without crepitus or deformity.   Back:   Nontender without crepitus or deformity. Abdomen: Abdomen is soft and nontender.  No evidence of intra-abdominal masses.  Positive bowel sounds noted in all quadrants.   Musculoskeletal: Please see skin examination findings above.  Notable tenderness around the left thigh and left hip surgical wound.   no contractures. Normal muscle tone.  Neurologic: CN 2-12 grossly intact. Sensation intact.  Patient moving all 4  extremities spontaneously.  Patient is following all commands.  Patient is responsive to verbal stimuli.   Psychiatric: Patient exhibits normal mood with appropriate affect.  Patient seems to possess insight as to their current situation.     Labs on Admission: I have personally reviewed following labs and imaging studies -   CBC: No results for input(s): WBC, NEUTROABS, HGB, HCT, MCV, PLT in the last 168 hours. Basic Metabolic Panel: No results for input(s): NA, K, CL, CO2, GLUCOSE, BUN, CREATININE, CALCIUM, MG, PHOS in the last 168 hours. GFR: CrCl cannot be calculated (Patient's most recent lab result is older than the maximum 21 days allowed.). Liver Function Tests: No results for input(s): AST, ALT, ALKPHOS, BILITOT, PROT, ALBUMIN in the last 168 hours. No results for input(s): LIPASE, AMYLASE in the last 168 hours. No results for input(s): AMMONIA in the last 168 hours. Coagulation Profile: No results for input(s): INR, PROTIME in the last 168 hours. Cardiac Enzymes: No results for input(s): CKTOTAL, CKMB, CKMBINDEX, TROPONINI in the last 168 hours. BNP (last 3 results) No results for input(s): PROBNP in the last 8760 hours. HbA1C: No results for input(s): HGBA1C in the last 72 hours. CBG: No results for input(s): GLUCAP in the last 168 hours. Lipid Profile: No results for input(s): CHOL, HDL, LDLCALC, TRIG, CHOLHDL, LDLDIRECT in the last 72 hours. Thyroid Function Tests: No results for input(s): TSH, T4TOTAL, FREET4, T3FREE, THYROIDAB in the last 72 hours. Anemia Panel: No results for input(s): VITAMINB12, FOLATE, FERRITIN, TIBC, IRON, RETICCTPCT in the last 72 hours. Urine analysis:    Component Value Date/Time   COLORURINE YELLOW 06/20/2017 1400   APPEARANCEUR CLEAR 06/20/2017 1400   LABSPEC 1.018 06/20/2017 1400   PHURINE 5.0 06/20/2017 1400   GLUCOSEU NEGATIVE 06/20/2017 1400   HGBUR MODERATE (A) 06/20/2017 1400   BILIRUBINUR NEGATIVE 06/20/2017 1400   KETONESUR  NEGATIVE 06/20/2017 1400   PROTEINUR NEGATIVE 06/20/2017 1400   NITRITE NEGATIVE 06/20/2017 1400   LEUKOCYTESUR NEGATIVE 06/20/2017 1400    Radiological Exams on Admission - Personally Reviewed: DG HIP PORT UNILAT WITH PELVIS 1V LEFT  Result Date: 05/18/2021 CLINICAL DATA:  Status post total hip replacement. EXAM: DG HIP (WITH OR WITHOUT PELVIS) 1V PORT LEFT COMPARISON:  Pelvic radiograph 04/23/2021 FINDINGS: Left hip arthroplasty in expected alignment no periprosthetic lucency or fracture. Some developing heterotopic calcification is noted adjacent to the greater trochanter and lateral acetabulum. Previous postsurgical soft tissue air and edema has resolved. Remainder of the bony pelvis is intact. IMPRESSION: 1. Left hip arthroplasty in expected alignment. No periprosthetic lucency or fracture. 2. Developing heterotopic calcification adjacent to the greater trochanter and lateral acetabulum. Electronically  Signed   By: Narda Rutherford M.D.   On: 05/18/2021 01:06    EKG: Personally reviewed.  Rhythm is normal sinus rhythm with heart rate of 82 bpm.  No dynamic ST segment changes appreciated.  Assessment/Plan Principal Problem:   Post-operative infection of the left hip surgical wound with significant surrounding cellulitis   Rapid progression of infection of the surgical wound with evidence of significant purulent drainage as well as significant surrounding erythema and induration.  Despite 2 small blisters noted on examination, mother states that these blisters are unchanged from yesterday.  Otherwise there is no clinical evidence of a necrotizing infection  Orthopedic surgery is recommending abstaining from antibiotic therapy until IR is able to obtain cultures.  Orthopedic surgery's input is appreciated.  Patient did receive a dose of intravenous vancomycin at [facility] emergency department however.  Patient currently n.p.o. in preparation for procedure  Patient requires  broad-spectrum  antibiotic coverage which would include coverage for MRSA and anaerobic organisms that should be initiated as soon as possible to avoid further progression of infection.  Blood cultures were additionally being obtained  As needed opiate-based analgesics for associated substantial pain Active Problems:   Rheumatoid arthritis involving multiple sites (HCC)   Longstanding history of rheumatoid arthritis and methotrexate use placing patient in an immunocompromised state.  Patient did recently stop her methotrexate regimen several weeks ago.    Moderate asthma without complication   No evidence of asthma exacerbation  As needed bronchodilator therapy for shortness of breath and wheezing    Gastroesophageal reflux disease without esophagitis   Continue home regimen of PPI  Code Status:  Full code Family Communication: Mother is at bedside who has been updated on plan of care.  Status is: Inpatient  Remains inpatient appropriate because:IV treatments appropriate due to intensity of illness or inability to take PO and Inpatient level of care appropriate due to severity of illness   Dispo: The patient is from: SNF              Anticipated d/c is to: SNF              Patient currently is not medically stable to d/c.   Difficult to place patient No        Marinda Elk MD [facility] Pager [phone]  If 7PM-7AM, please contact night-coverage [email or web address] Use universal [place] password for that web site. If you do not have the password, please call the hospital operator.  05/18/2021, 1:15 AM

## 2021-05-18 NOTE — Progress Notes (Signed)
PROGRESS NOTE    MACIEL KEGG  JJK:[id number] DOB: 1981-05-15 DOA: 05/17/2021 PCP: Merri Brunette, MD   Brief Narrative: hpi per dr Leafy Half  40 year old female with past medical history of Down syndrome, hypothyroidism, asthma, gastroesophageal reflux disease, rheumatoid arthritis and recent left hip total arthroplasty on 4/27 who presents to [facility] long emergency department as a transfer from [facility] emergency department due to increasing drainage and tenderness at the surgical site.  Patient is a somewhat poor historian and therefore majority history is obtained from the mother who is at the bedside.  Patient's mother explains that the patient has been participating with physical therapy at [facility] rehabilitation and has been making gains ever since being admitted there several weeks ago postoperatively.  Yesterday, patient suddenly was found to have a increasing drainage coming from the wound.  This drainage was associated with rapid progression of redness surrounding the wound, seeming to extend down the thigh towards the knee.  These findings rapidly progressed throughout the day and were associated with significant pain.  The patient reports that she has been experiencing substantial moderate to severe pain of the left thigh particularly surrounding the surgical wound.  She states that the pain is worse with movement or palpation of the area.  Due to this concerning progression of significant drainage and redness of the area the patient was sent to [facility] health emergency department for evaluation.  Upon evaluation in the emergency department emergency department staff discussed the case with Dr. Linna Caprice with orthopedic surgery at [facility] health who recommended the patient be transferred from ED to ED with the hospitalist group to admit the patient and orthopedic surgery to consult.  Tentative plan is for patient to undergo IR guided  drainage of the area.  Upon arrival to [facility] emergency department the hospitalist group is now been called to assess patient for admission to the hospital.  Assessment & Plan:   Principal Problem:   Post-operative infection Active Problems:   Rheumatoid arthritis involving multiple sites [facility])   Gastroesophageal reflux disease without esophagitis   Moderate asthma without complication   Hypothyroidism   #1 left hip infection status post left total hip arthroplasty 04/23/2021 Dr. Lequita Halt.  Patient admitted with increasing left hip drainage and increasing pain.  Seen by Ortho Patient to have IR guided drainage done today I&D tomorrow On vanco and merepenem  #2 asthma -stable  #3 gerd on ppi  #4 obesity counciled mom and patient   #5 downs syndrome-  #6 hypoalbuminemia cc/s dietary     Estimated body mass index is 40.08 kg/m as calculated from the following:   Height as of 04/23/21: 4\' 6"  (1.372 m).   Weight as of 04/23/21: 75.4 kg.  DVT prophylaxis:none pending surgery  Code Status: full Family Communication: Discussed with mother  disposition Plan:  Status is: Inpatient  Dispo: The patient is from: Home              Anticipated d/c is to: SNF              Patient currently not medically stable   difficult to place patient no  Consultants: ortho  Procedures Antimicrobials:vancomycin and meropenem   Subjective: Blood-tinged purulent drainage from the left hip this morning.  Awaiting to see Ortho.  Objective: Vitals:   05/18/21 0215 05/18/21 0313 05/18/21 0554 05/18/21 1351  BP: 102/66 (!) 120/97 124/70 120/74  Pulse: 80 81 72 79  Resp: 15 16 16  18  Temp:  97.8 F (36.6 C) 98 F (36.7 C) 98.3 F (36.8 C)  TempSrc:  Oral Axillary   SpO2: 97% 96% 97% 99%    Intake/Output Summary (Last 24 hours) at 05/18/2021 1351 Last data filed at 05/18/2021 1319 Gross per 24 hour  Intake 745.5 ml  Output 0 ml  Net 745.5 ml   There were no vitals filed  for this visit.  Examination:  General exam: Appears calm and comfortable  Respiratory system: Clear to auscultation. Respiratory effort normal. Cardiovascular system: S1 & S2 heard, RRR. No JVD, murmurs, rubs, gallops or clicks. No pedal edema. Gastrointestinal system: Abdomen is nondistended, soft and nontender. No organomegaly or masses felt. Normal bowel sounds heard. Central nervous system: Alert and oriented. No focal neurological deficits. Extremities: Left hip incision is covered with dressing but does have purulent drainage and erythema   Skin: left hip erythema Psychiatry: Judgement and insight appear normal. Mood & affect appropriate.     Data Reviewed: I have personally reviewed following labs and imaging studies  CBC: Recent Labs  Lab 05/17/21 2209  WBC 7.2  NEUTROABS 5.3  HGB 9.7*  HCT 30.5*  MCV 108.2*  PLT 486*   Basic Metabolic Panel: Recent Labs  Lab 05/18/21 0352  NA 139  K 4.0  CL 109  CO2 24  GLUCOSE 91  BUN 9  CREATININE 0.57  CALCIUM 8.1*   GFR: CrCl cannot be calculated (Unknown ideal weight.). Liver Function Tests: Recent Labs  Lab 05/18/21 0352  AST 16  ALT 18  ALKPHOS 203*  BILITOT 0.5  PROT 6.8  ALBUMIN 2.4*   No results for input(s): LIPASE, AMYLASE in the last 168 hours. No results for input(s): AMMONIA in the last 168 hours. Coagulation Profile: Recent Labs  Lab 05/18/21 0352  INR 1.2   Cardiac Enzymes: No results for input(s): CKTOTAL, CKMB, CKMBINDEX, TROPONINI in the last 168 hours. BNP (last 3 results) No results for input(s): PROBNP in the last 8760 hours. HbA1C: No results for input(s): HGBA1C in the last 72 hours. CBG: No results for input(s): GLUCAP in the last 168 hours. Lipid Profile: No results for input(s): CHOL, HDL, LDLCALC, TRIG, CHOLHDL, LDLDIRECT in the last 72 hours. Thyroid Function Tests: No results for input(s): TSH, T4TOTAL, FREET4, T3FREE, THYROIDAB in the last 72 hours. Anemia Panel: No  results for input(s): VITAMINB12, FOLATE, FERRITIN, TIBC, IRON, RETICCTPCT in the last 72 hours. Sepsis Labs: No results for input(s): PROCALCITON, LATICACIDVEN in the last 168 hours.  Recent Results (from the past 240 hour(s))  SARS CORONAVIRUS 2 (TAT 6-24 HRS) Nasopharyngeal Nasopharyngeal Swab     Status: None   Collection Time: 05/17/21 10:04 PM   Specimen: Nasopharyngeal Swab  Result Value Ref Range Status   SARS Coronavirus 2 NEGATIVE NEGATIVE Final    Comment: (NOTE) SARS-CoV-2 target nucleic acids are NOT DETECTED.  The SARS-CoV-2 RNA is generally detectable in upper and lower respiratory specimens during the acute phase of infection. Negative results do not preclude SARS-CoV-2 infection, do not rule out co-infections with other pathogens, and should not be used as the sole basis for treatment or other patient management decisions. Negative results must be combined with clinical observations, patient history, and epidemiological information. The expected result is Negative.  Fact Sheet for Patients: HairSlick.no  Fact Sheet for Healthcare Providers: quierodirigir.com  This test is not yet approved or cleared by the [place] FDA and  has been authorized for detection and/or diagnosis of SARS-CoV-2 by FDA under an  Emergency Use Authorization (EUA). This EUA will remain  in effect (meaning this test can be used) for the duration of the COVID-19 declaration under Se ction 564(b)(1) of the Act, 21 U.S.C. section 360bbb-3(b)(1), unless the authorization is terminated or revoked sooner.  Performed at [facility], [address], [place], [place] [address]          Radiology Studies: DG FLUORO GUIDED NEEDLE [facility] ASPIRATION/INJECTION LOC  Result Date: 05/18/2021 CLINICAL DATA:  Patient with drainage from the lateral left hip. Patient had a total left hip arthroplasty performed on 04/23/2021. 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 EXAM: LEFT HIP INJECTION UNDER FLUOROSCOPY COMPARISON:  Current left hip radiographs. FLUOROSCOPY TIME:  Fluoroscopy Time:  0 minutes and 48 seconds. Radiation Exposure Index (if provided by the fluoroscopic device): 12.3 mGy Number of Acquired Spot Images: 1 PROCEDURE: Overlying skin prepped with Betadine, draped in the usual sterile fashion, and infiltrated locally with buffered Lidocaine. A 20 gauge spinal needle advanced to the junction of the left femoral prosthesis head and neck. 1 ml of Lidocaine injected easily. The hip was then aspirated with a total of 2 mL of bloody fluid acquired. No additional fluid could be aspirated. Fluid was sent for laboratory analysis. IMPRESSION: Fluoroscopic guided aspiration of the left hip without complication. 2 mL of bloody fluid acquired. Electronically Signed   By: Amie Portland M.D.   On: 05/18/2021 12:40   DG HIP PORT UNILAT WITH PELVIS 1V LEFT  Result Date: 05/18/2021 CLINICAL DATA:  Status post total hip replacement. EXAM: DG HIP (WITH OR WITHOUT PELVIS) 1V PORT LEFT COMPARISON:  Pelvic radiograph 04/23/2021 FINDINGS: Left hip arthroplasty in expected alignment no periprosthetic lucency or fracture. Some developing heterotopic calcification is noted adjacent to the greater trochanter and lateral acetabulum. Previous postsurgical soft tissue air and edema has resolved. Remainder of the bony pelvis is intact. IMPRESSION: 1. Left hip arthroplasty in expected alignment. No periprosthetic lucency or fracture. 2. Developing heterotopic calcification adjacent to the greater trochanter and lateral acetabulum. Electronically Signed   By: Narda Rutherford M.D.   On: 05/18/2021 01:06        Scheduled Meds: . folic acid  1 mg Oral Daily  . lidocaine (PF)      . pantoprazole  40 mg Oral Daily  . sodium chloride  1 spray Nasal BID   Continuous Infusions: . sodium chloride 100 mL/hr at 05/18/21 1319  . vancomycin        LOS: 1 day   Alwyn Ren, MD 05/18/2021, 1:51 PM

## 2021-05-18 NOTE — Plan of Care (Signed)
  Problem: Coping: Goal: Level of anxiety will decrease Outcome: Progressing   Problem: Pain Managment: Goal: General experience of comfort will improve Outcome: Progressing   Problem: Safety: Goal: Ability to remain free from injury will improve Outcome: Progressing   Problem: Coping: Goal: Level of anxiety will decrease Outcome: Progressing

## 2021-05-18 NOTE — Progress Notes (Signed)
CRITICAL VALUE STICKER  CRITICAL VALUE: mrsa pcr positive  RECEIVER (on-site recipient of call): Metallurgist rn  DATE & TIME NOTIFIED: 05-18-21 1500  MESSENGER (representative from lab): lab tech  MD NOTIFIED: elizabeth mathews  TIME OF NOTIFICATION: 1508  RESPONSE: rn placed mrsa pcr protocol

## 2021-05-18 NOTE — H&P (View-Only) (Signed)
ORTHOPAEDIC CONSULTATION  REQUESTING PHYSICIAN: Alwyn Ren, MD  PCP:  Merri Brunette, MD  Chief Complaint: Left hip drainage  HPI: Marissa Gray is a 40 y.o. female who was transferred to [facility] ED on 05/17/21 from [place] secondary to left hip wound drainage. She is a patient of Dr. Deri Fuelling, s/p left posterior hip replacement on 04/23/21. Per ED notes, she had drainage on May 8th which quickly resolved. She then began having recurrent drainage on 5/19, which significantly worsened on 5/20 & 5/21. Dr. Linna Caprice was consulted for orthopaedic management and she was admitted for suspected left hip superficial vs deep wound infection.   Today, she is resting in bed with her mom at the bedside. She is well known to me. She is in good spirits this morning, but concerned about next steps. She reports that she was doing very well at her rehab facility after the hip surgery, but began noticing a small amount of drainage on Thursday, 5/19. This worsened on Friday and she went to the ED for evaluation. She denies increased pain in the hip, as well as fever or chills. She reports she had still been taking Xarelto for DVT prophylaxis.   Past Medical History:  Diagnosis Date  . Asthma   . 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 Closed bicondylar fracture of right tibial plateau 06/20/2017  . 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 Dermatitis    left leg   . Down's syndrome   . GERD (gastroesophageal reflux disease)   . Hashimoto's disease   . Heart murmur   . Hypothyroidism   . Pneumonia    hx of years ago   . Rheumatoid arthritis (HCC)   . Strabismus    Past Surgical History:  Procedure Laterality Date  . CARDIAC SURGERY     ASD and vSD repair   . FOOT ARTHRODESIS, TRIPLE     bilateral   . ORIF TIBIA PLATEAU Right 06/22/2017   Procedure: OPEN REDUCTION INTERNAL FIXATION (ORIF) TIBIAL PLATEAU;  Surgeon: Myrene Galas, MD;  Location: [facility] OR;  Service:  Orthopedics;  Laterality: Right;  . STRABISMUS SURGERY    . TOTAL HIP ARTHROPLASTY Left 04/23/2021   Procedure: TOTAL HIP ARTHROPLASTY-Posterior;  Surgeon: Ollen Gross, MD;  Location: [facility];  Service: Orthopedics;  Laterality: Left;   . ulcer surgery      Social History   Socioeconomic History  . Marital status: Single    Spouse name: Not on file  . Number of children: Not on file  . Years of education: Not on file  . Highest education level: Not on file  Occupational History  . Not on file  Tobacco Use  . Smoking status: Never Smoker  . Smokeless tobacco: Never Used  Vaping Use  . Vaping Use: Never used  Substance and Sexual Activity  . Alcohol use: No  . Drug use: No  . Sexual activity: Not on file  Other Topics Concern  . Not on file  Social History Narrative  . Not on file   Social Determinants of Health   Financial Resource Strain: Not on file  Food Insecurity: Not on file  Transportation Needs: Not on file  Physical Activity: Not on file  Stress: Not on file  Social Connections: Not on file   Family History  Problem Relation Age of Onset  . Heart disease Neg Hx    Allergies  Allergen Reactions  . Cefaclor Hives and Rash  Other reaction(s): Unknown  . Penicillins Hives     Has patient had a PCN reaction causing immediate rash, facial/tongue/throat swelling, SOB or lightheadedness with hypotension: Unknown Has patient had a PCN reaction causing severe rash involving mucus membranes or skin necrosis: Unknown Has patient had a PCN reaction that required hospitalization: Unknown Has patient had a PCN reaction occurring within the last 10 years: Unknown If all of the above answers are "NO", then may proceed with Cephalosporin use. Other reaction(s): rash, Unknown  . Sulfa Antibiotics Hives and Other (See Comments)    FEVER Other reaction(s): Unknown   Prior to Admission medications   Medication Sig Start Date End Date Taking? Authorizing  Provider  aspirin 81 MG chewable tablet Chew 81 mg by mouth daily. For heart health for 3 weeks   Yes [provider]  clotrimazole-betamethasone (LOTRISONE) cream Apply 1 application topically 2 (two) times daily.   Yes [provider]  esomeprazole (NEXIUM) 40 MG capsule Take 40 mg by mouth in the morning. (0700)   Yes [provider]  Eyelid Cleansers (OCUSOFT EYELID CLEANSING) PADS Place 1 application into both eyes in the morning and at bedtime. Apply to Eyelid and eyelash topically two times a day for Dry eyes   Yes [provider]  folic acid (FOLVITE) 1 MG tablet Take 1 mg by mouth in the morning. (0700)   Yes [provider]  HYDROcodone-acetaminophen (NORCO/VICODIN) 5-325 MG tablet Take 1-2 tablets by mouth every 6 (six) hours as needed for severe pain. 04/24/21  Yes Nelia Shi D, PA-C  levonorgestrel-ethinyl estradiol (ALESSE) 0.1-20 MG-MCG tablet Take 1 tablet by mouth in the morning. (0700)   Yes [provider]  loratadine (CLARITIN) 10 MG tablet Take 10 mg by mouth daily as needed for allergies.   Yes [provider]  methocarbamol (ROBAXIN) 500 MG tablet Take 1 tablet (500 mg total) by mouth every 6 (six) hours as needed for muscle spasms. 04/24/21  Yes Nelia Shi D, PA-C  potassium chloride (MICRO-K) 10 MEQ CR capsule Take 10 mEq by mouth 2 (two) times daily. (0700 & 2000)   Yes [provider]  rivaroxaban (XARELTO) 10 MG TABS tablet Take 1 tablet (10 mg total) by mouth daily with breakfast. 04/24/21  Yes Nelia Shi D, PA-C  sodium chloride (OCEAN) 0.65 % nasal spray Place 1 spray into the nose 2 (two) times daily. (0700 & 2000)   Yes [provider]   DG HIP PORT UNILAT WITH PELVIS 1V LEFT  Result Date: 05/18/2021 CLINICAL DATA:  Status post total hip replacement. EXAM: DG HIP (WITH OR WITHOUT PELVIS) 1V PORT LEFT COMPARISON:  Pelvic radiograph 04/23/2021 FINDINGS: Left hip arthroplasty in  expected alignment no periprosthetic lucency or fracture. Some developing heterotopic calcification is noted adjacent to the greater trochanter and lateral acetabulum. Previous postsurgical soft tissue air and edema has resolved. Remainder of the bony pelvis is intact. IMPRESSION: 1. Left hip arthroplasty in expected alignment. No periprosthetic lucency or fracture. 2. Developing heterotopic calcification adjacent to the greater trochanter and lateral acetabulum. Electronically Signed   By: Narda Rutherford M.D.   On: 05/18/2021 01:06    Positive ROS: All other systems have been reviewed and were otherwise negative with the exception of those mentioned in the HPI and as above.  Physical Exam: General: Alert, no acute distress Cardiovascular: No pedal edema Respiratory: No cyanosis, no use of accessory musculature GI: No organomegaly, abdomen is soft and non-tender Skin: No lesions in the  area of chief complaint Neurologic: Sensation intact distally Psychiatric: Patient is competent for consent with normal mood and affect Lymphatic: No axillary or cervical lymphadenopathy  MUSCULOSKELETAL:  Left hip Exam: Posterior hip incision with no dehiscence. Several small areas about the distal incision which appear to have been draining recently, no active drainage now. Erythema about the peri-incisional area with mild warmth. She tolerates passive hip rolling and flexion without significant pain. Distal sensation and foot/ankle ROM intact.   Assessment: Left hip infection, s/p left total hip arthroplasty on 04/23/21 by Dr. Lequita Halt  Plan:  Remain NPO for IR guided left hip aspiration today. Plan for likely I&D tomorrow afternoon with Dr. Charlann Boxer. May eat after the aspiration, and then NPO again after midnight. Continue pain control. Hold anti-coagulants.     Tommie Ard, PA-C Cell [phone]   05/18/2021 10:20 AM

## 2021-05-18 NOTE — Progress Notes (Signed)
Pt to radiology in stable condition for left hip aspiration. No needs at time of transport.

## 2021-05-18 NOTE — Progress Notes (Signed)
Pharmacy Antibiotic Note  Marissa Gray is a 40 y.o. female admitted on 05/17/2021 with post-op infection.  Pharmacy has been consulted for vancomycin and meropenem dosing.  Pt is 18 yoF with PMH significant for Down's syndrome, RA. Recently underwent left total hip arthroplasty on 04/23/21. Pt having increased tenderness/drainage from surgical site, presented to [facility] ED and subsequently transferred to [facility].   Pharmacy consulted to delay initiation of antibiotics until after IR guided surgical wound drainage completed on 5/22. Of note, patient did receive a dose of vancomycin on 5/21 PM prior to transfer to [facility].  Today, 05/18/21  WBC WNL  SCr WNL. CrCl > 60 mL/min using Adj BW of 50 kg  Recorded TBW 04/23/21 of 75.4 kg used  CRP 22.5, Sed rate 137 - both elevated  Fluoroscopic guided aspiration of left hip completed 5/22  Plan:  Meropenem 1 g IV q8h  Vancomycin 750 mg IV q24  Goal vancomycin AUC 400-550.   Monitor renal function, culture data. Check vancomycin levels at steady state as needed    Temp (24hrs), Avg:98 F (36.7 C), Min:97.8 F (36.6 C), Max:98.3 F (36.8 C)  Recent Labs  Lab 05/17/21 2209 05/18/21 0352  WBC 7.2  --   CREATININE  --  0.57    CrCl cannot be calculated (Unknown ideal weight.).    Allergies  Allergen Reactions  . Cefaclor Hives and Rash    Other reaction(s): Unknown  . Penicillins Hives     Has patient had a PCN reaction causing immediate rash, facial/tongue/throat swelling, SOB or lightheadedness with hypotension: Unknown Has patient had a PCN reaction causing severe rash involving mucus membranes or skin necrosis: Unknown Has patient had a PCN reaction that required hospitalization: Unknown Has patient had a PCN reaction occurring within the last 10 years: Unknown If all of the above answers are "NO", then may proceed with Cephalosporin use. Other reaction(s): rash, Unknown  . Sulfa Antibiotics Hives and Other (See Comments)     FEVER Other reaction(s): Unknown    Antimicrobials this admission: vancomycin 5/21 >>  meropenem 5/22 >>   Dose adjustments this admission:  Microbiology results: 5/22 BCx:  5/22 Synovial fluid:   5/22 MRSA PCR: Positive  Thank you for allowing pharmacy to be a part of this patient's care.  Cindi Carbon, PharmD 05/18/2021 2:54 PM

## 2021-05-18 NOTE — ED Notes (Signed)
Pt returned from procedure

## 2021-05-18 NOTE — Consult Note (Signed)
ORTHOPAEDIC CONSULTATION  REQUESTING PHYSICIAN: Alwyn Ren, MD  PCP:  Merri Brunette, MD  Chief Complaint: Left hip drainage  HPI: Marissa Gray is a 40 y.o. female who was transferred to [facility] ED on 05/17/21 from [place] secondary to left hip wound drainage. She is a patient of Dr. Deri Fuelling, s/p left posterior hip replacement on 04/23/21. Per ED notes, she had drainage on May 8th which quickly resolved. She then began having recurrent drainage on 5/19, which significantly worsened on 5/20 & 5/21. Dr. Linna Caprice was consulted for orthopaedic management and she was admitted for suspected left hip superficial vs deep wound infection.   Today, she is resting in bed with her mom at the bedside. She is well known to me. She is in good spirits this morning, but concerned about next steps. She reports that she was doing very well at her rehab facility after the hip surgery, but began noticing a small amount of drainage on Thursday, 5/19. This worsened on Friday and she went to the ED for evaluation. She denies increased pain in the hip, as well as fever or chills. She reports she had still been taking Xarelto for DVT prophylaxis.   Past Medical History:  Diagnosis Date  . Asthma   . 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 Closed bicondylar fracture of right tibial plateau 06/20/2017  . 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 Dermatitis    left leg   . Down's syndrome   . GERD (gastroesophageal reflux disease)   . Hashimoto's disease   . Heart murmur   . Hypothyroidism   . Pneumonia    hx of years ago   . Rheumatoid arthritis (HCC)   . Strabismus    Past Surgical History:  Procedure Laterality Date  . CARDIAC SURGERY     ASD and vSD repair   . FOOT ARTHRODESIS, TRIPLE     bilateral   . ORIF TIBIA PLATEAU Right 06/22/2017   Procedure: OPEN REDUCTION INTERNAL FIXATION (ORIF) TIBIAL PLATEAU;  Surgeon: Myrene Galas, MD;  Location: [facility] OR;  Service:  Orthopedics;  Laterality: Right;  . STRABISMUS SURGERY    . TOTAL HIP ARTHROPLASTY Left 04/23/2021   Procedure: TOTAL HIP ARTHROPLASTY-Posterior;  Surgeon: Ollen Gross, MD;  Location: [facility];  Service: Orthopedics;  Laterality: Left;   . ulcer surgery      Social History   Socioeconomic History  . Marital status: Single    Spouse name: Not on file  . Number of children: Not on file  . Years of education: Not on file  . Highest education level: Not on file  Occupational History  . Not on file  Tobacco Use  . Smoking status: Never Smoker  . Smokeless tobacco: Never Used  Vaping Use  . Vaping Use: Never used  Substance and Sexual Activity  . Alcohol use: No  . Drug use: No  . Sexual activity: Not on file  Other Topics Concern  . Not on file  Social History Narrative  . Not on file   Social Determinants of Health   Financial Resource Strain: Not on file  Food Insecurity: Not on file  Transportation Needs: Not on file  Physical Activity: Not on file  Stress: Not on file  Social Connections: Not on file   Family History  Problem Relation Age of Onset  . Heart disease Neg Hx    Allergies  Allergen Reactions  . Cefaclor Hives and Rash  Other reaction(s): Unknown  . Penicillins Hives     Has patient had a PCN reaction causing immediate rash, facial/tongue/throat swelling, SOB or lightheadedness with hypotension: Unknown Has patient had a PCN reaction causing severe rash involving mucus membranes or skin necrosis: Unknown Has patient had a PCN reaction that required hospitalization: Unknown Has patient had a PCN reaction occurring within the last 10 years: Unknown If all of the above answers are "NO", then may proceed with Cephalosporin use. Other reaction(s): rash, Unknown  . Sulfa Antibiotics Hives and Other (See Comments)    FEVER Other reaction(s): Unknown   Prior to Admission medications   Medication Sig Start Date End Date Taking? Authorizing  Provider  aspirin 81 MG chewable tablet Chew 81 mg by mouth daily. For heart health for 3 weeks   Yes [provider]  clotrimazole-betamethasone (LOTRISONE) cream Apply 1 application topically 2 (two) times daily.   Yes [provider]  esomeprazole (NEXIUM) 40 MG capsule Take 40 mg by mouth in the morning. (0700)   Yes [provider]  Eyelid Cleansers (OCUSOFT EYELID CLEANSING) PADS Place 1 application into both eyes in the morning and at bedtime. Apply to Eyelid and eyelash topically two times a day for Dry eyes   Yes [provider]  folic acid (FOLVITE) 1 MG tablet Take 1 mg by mouth in the morning. (0700)   Yes [provider]  HYDROcodone-acetaminophen (NORCO/VICODIN) 5-325 MG tablet Take 1-2 tablets by mouth every 6 (six) hours as needed for severe pain. 04/24/21  Yes Nelia Shi D, PA-C  levonorgestrel-ethinyl estradiol (ALESSE) 0.1-20 MG-MCG tablet Take 1 tablet by mouth in the morning. (0700)   Yes [provider]  loratadine (CLARITIN) 10 MG tablet Take 10 mg by mouth daily as needed for allergies.   Yes [provider]  methocarbamol (ROBAXIN) 500 MG tablet Take 1 tablet (500 mg total) by mouth every 6 (six) hours as needed for muscle spasms. 04/24/21  Yes Nelia Shi D, PA-C  potassium chloride (MICRO-K) 10 MEQ CR capsule Take 10 mEq by mouth 2 (two) times daily. (0700 & 2000)   Yes [provider]  rivaroxaban (XARELTO) 10 MG TABS tablet Take 1 tablet (10 mg total) by mouth daily with breakfast. 04/24/21  Yes Nelia Shi D, PA-C  sodium chloride (OCEAN) 0.65 % nasal spray Place 1 spray into the nose 2 (two) times daily. (0700 & 2000)   Yes [provider]   DG HIP PORT UNILAT WITH PELVIS 1V LEFT  Result Date: 05/18/2021 CLINICAL DATA:  Status post total hip replacement. EXAM: DG HIP (WITH OR WITHOUT PELVIS) 1V PORT LEFT COMPARISON:  Pelvic radiograph 04/23/2021 FINDINGS: Left hip arthroplasty in  expected alignment no periprosthetic lucency or fracture. Some developing heterotopic calcification is noted adjacent to the greater trochanter and lateral acetabulum. Previous postsurgical soft tissue air and edema has resolved. Remainder of the bony pelvis is intact. IMPRESSION: 1. Left hip arthroplasty in expected alignment. No periprosthetic lucency or fracture. 2. Developing heterotopic calcification adjacent to the greater trochanter and lateral acetabulum. Electronically Signed   By: Narda Rutherford M.D.   On: 05/18/2021 01:06    Positive ROS: All other systems have been reviewed and were otherwise negative with the exception of those mentioned in the HPI and as above.  Physical Exam: General: Alert, no acute distress Cardiovascular: No pedal edema Respiratory: No cyanosis, no use of accessory musculature GI: No organomegaly, abdomen is soft and non-tender Skin: No lesions in the  area of chief complaint Neurologic: Sensation intact distally Psychiatric: Patient is competent for consent with normal mood and affect Lymphatic: No axillary or cervical lymphadenopathy  MUSCULOSKELETAL:  Left hip Exam: Posterior hip incision with no dehiscence. Several small areas about the distal incision which appear to have been draining recently, no active drainage now. Erythema about the peri-incisional area with mild warmth. She tolerates passive hip rolling and flexion without significant pain. Distal sensation and foot/ankle ROM intact.   Assessment: Left hip infection, s/p left total hip arthroplasty on 04/23/21 by Dr. Aluisio  Plan:  Remain NPO for IR guided left hip aspiration today. Plan for likely I&D tomorrow afternoon with Dr. Olin. May eat after the aspiration, and then NPO again after midnight. Continue pain control. Hold anti-coagulants.     Chassidy Layson R Tray Klayman, PA-C Cell [phone]   05/18/2021 10:20 AM  

## 2021-05-18 NOTE — Plan of Care (Signed)
  Problem: Clinical Measurements: Goal: Respiratory complications will improve Outcome: Progressing   Problem: Clinical Measurements: Goal: Cardiovascular complication will be avoided Outcome: Progressing   Problem: Elimination: Goal: Will not experience complications related to bowel motility Outcome: Progressing   Problem: Elimination: Goal: Will not experience complications related to urinary retention Outcome: Progressing   Problem: Skin Integrity: Goal: Risk for impaired skin integrity will decrease Outcome: Progressing   

## 2021-05-18 NOTE — Progress Notes (Addendum)
Pharmacy Antibiotic Note  Marissa Gray is a 40 y.o. female admitted on 05/17/2021 with wound problem.  PT had hip replacement performed on 4/27 and now has developed drainage, redness and skin changes to the inferior portion of the wound.  Pharmacy has been consulted for vancomycin dosing.  Plan: Start vancomycin 750mg  q24h (AUC 460.2, used Scr 0.8) after hip aspiration performed.   Follow renal function and clinical course     Temp (24hrs), Avg:98 F (36.7 C), Min:98 F (36.7 C), Max:98 F (36.7 C)  Recent Labs  Lab 05/17/21 2209  WBC 7.2    CrCl cannot be calculated (Patient's most recent lab result is older than the maximum 21 days allowed.).    Allergies  Allergen Reactions  . Cefaclor Hives and Rash    Other reaction(s): Unknown  . Penicillins Hives     Has patient had a PCN reaction causing immediate rash, facial/tongue/throat swelling, SOB or lightheadedness with hypotension: Unknown Has patient had a PCN reaction causing severe rash involving mucus membranes or skin necrosis: Unknown Has patient had a PCN reaction that required hospitalization: Unknown Has patient had a PCN reaction occurring within the last 10 years: Unknown If all of the above answers are "NO", then may proceed with Cephalosporin use. Other reaction(s): rash, Unknown  . Sulfa Antibiotics Hives and Other (See Comments)    FEVER Other reaction(s): Unknown    Antimicrobials this admission: 5/22 vanc >>  Dose adjustments this admission:   Microbiology results:  Thank you for allowing pharmacy to be a part of this patient's care.  6/22 RPh 05/18/2021, 2:23 AM

## 2021-05-18 NOTE — ED Notes (Signed)
Sbar reviewed, ready for patient

## 2021-05-18 NOTE — Consult Note (Signed)
WOC Nurse Consult Note: Reason for Consult: WOC Nursing is simultaneously consulted with orthopedics for recurrent drainage from left hip wound. Patient is total left hip arthroplasty on 04/23/21 with Dr. Despina Hick. Currently with no active drainage and several small open areas near the distal portion of the incisions.  Peri incisional erythema and warmth.  Plan is for possible guided aspiration today with Dr. Charlann Boxer. Wound type:Surgical, infectious Pressure Injury POA: N/A Measurement: As noted above and as per photo, few partial thickness open area at distal end of incision. Wound bed:pin, moist Drainage (amount, consistency, odor) As noted above Periwound:As noted above Dressing procedure/placement/frequency: I will defer wound care to left hip to Orthopedics, but advise Nursing to use a dry dressing and paper tape should wound drainage again present  WOC nursing team will not follow, but will remain available to this patient, the nursing and medical teams.  Please re-consult if needed. Thanks, Ladona Mow, MSN, RN, GNP, [facility] [place]  Pager# [phone]

## 2021-05-19 ENCOUNTER — Inpatient Hospital Stay (HOSPITAL_COMMUNITY): Payer: Medicaid Other | Admitting: Certified Registered Nurse Anesthetist

## 2021-05-19 ENCOUNTER — Encounter (HOSPITAL_COMMUNITY)
Admission: EM | Disposition: A | Discharge status: Skilled Nursing Facility | Payer: Self-pay | Source: Other Acute Inpatient Hospital | Attending: Internal Medicine

## 2021-05-19 ENCOUNTER — Other Ambulatory Visit: Payer: Self-pay

## 2021-05-19 ENCOUNTER — Encounter (HOSPITAL_COMMUNITY): Payer: Self-pay | Admitting: Internal Medicine

## 2021-05-19 HISTORY — PX: I & D EXTREMITY: SHX5045

## 2021-05-19 LAB — POCT I-STAT EG7
Acid-base deficit: 4 mmol/L — ABNORMAL HIGH (ref 0.0–2.0)
Bicarbonate: 21.6 mmol/L (ref 20.0–28.0)
Calcium, Ion: 1.12 mmol/L — ABNORMAL LOW (ref 1.15–1.40)
HCT: 28 % — ABNORMAL LOW (ref 36.0–46.0)
Hemoglobin: 9.5 g/dL — ABNORMAL LOW (ref 12.0–15.0)
O2 Saturation: 75 %
Potassium: 4.4 mmol/L (ref 3.5–5.1)
Sodium: 140 mmol/L (ref 135–145)
TCO2: 23 mmol/L (ref 22–32)
pCO2, Ven: 39.4 mmHg — ABNORMAL LOW (ref 44.0–60.0)
pH, Ven: 7.348 (ref 7.250–7.430)
pO2, Ven: 42 mmHg (ref 32.0–45.0)

## 2021-05-19 LAB — COMPREHENSIVE METABOLIC PANEL
ALT: 23 U/L (ref 0–44)
AST: 20 U/L (ref 15–41)
Albumin: 2.3 g/dL — ABNORMAL LOW (ref 3.5–5.0)
Alkaline Phosphatase: 187 U/L — ABNORMAL HIGH (ref 38–126)
Anion gap: 6 (ref 5–15)
BUN: 10 mg/dL (ref 6–20)
CO2: 25 mmol/L (ref 22–32)
Calcium: 7.7 mg/dL — ABNORMAL LOW (ref 8.9–10.3)
Chloride: 109 mmol/L (ref 98–111)
Creatinine, Ser: 0.7 mg/dL (ref 0.44–1.00)
GFR, Estimated: >60 mL/min (ref >60)
Glucose, Bld: 99 mg/dL (ref 70–99)
Potassium: 4 mmol/L (ref 3.5–5.1)
Sodium: 140 mmol/L (ref 135–145)
Total Bilirubin: 0.3 mg/dL (ref 0.3–1.2)
Total Protein: 6.3 g/dL — ABNORMAL LOW (ref 6.5–8.1)

## 2021-05-19 LAB — CBC
HCT: 27.2 % — ABNORMAL LOW (ref 36.0–46.0)
Hemoglobin: 8.7 g/dL — ABNORMAL LOW (ref 12.0–15.0)
MCH: 34.9 pg — ABNORMAL HIGH (ref 26.0–34.0)
MCHC: 32 g/dL (ref 30.0–36.0)
MCV: 109.2 fL — ABNORMAL HIGH (ref 80.0–100.0)
Platelets: 442 10*3/uL — ABNORMAL HIGH (ref 150–400)
RBC: 2.49 MIL/uL — ABNORMAL LOW (ref 3.87–5.11)
RDW: 13.9 % (ref 11.5–15.5)
WBC: 4.4 10*3/uL (ref 4.0–10.5)
nRBC: 0.7 % — ABNORMAL HIGH (ref 0.0–0.2)

## 2021-05-19 LAB — PATHOLOGIST SMEAR REVIEW

## 2021-05-19 SURGERY — IRRIGATION AND DEBRIDEMENT EXTREMITY
Anesthesia: General | Laterality: Left

## 2021-05-19 MED ORDER — METHOCARBAMOL 500 MG IVPB - SIMPLE MED
INTRAVENOUS | Status: AC
  Filled 2021-05-19: qty 50

## 2021-05-19 MED ORDER — FENTANYL CITRATE (PF) 250 MCG/5ML IJ SOLN
INTRAMUSCULAR | Status: AC
  Filled 2021-05-19: qty 5

## 2021-05-19 MED ORDER — BUPIVACAINE-EPINEPHRINE (PF) 0.25% -1:200000 IJ SOLN
INTRAMUSCULAR | Status: AC
  Filled 2021-05-19: qty 30

## 2021-05-19 MED ORDER — SODIUM CHLORIDE 0.9 % IR SOLN
Status: DC | PRN
  Administered 2021-05-19 (×2): 3000 mL

## 2021-05-19 MED ORDER — FENTANYL CITRATE (PF) 100 MCG/2ML IJ SOLN
INTRAMUSCULAR | Status: AC
  Filled 2021-05-19: qty 2

## 2021-05-19 MED ORDER — KETOROLAC TROMETHAMINE 30 MG/ML IJ SOLN
INTRAMUSCULAR | Status: AC
  Filled 2021-05-19: qty 1

## 2021-05-19 MED ORDER — FENTANYL CITRATE (PF) 100 MCG/2ML IJ SOLN
25.0000 ug | INTRAMUSCULAR | Status: DC | PRN
  Administered 2021-05-19: 50 ug via INTRAVENOUS

## 2021-05-19 MED ORDER — FENTANYL CITRATE (PF) 250 MCG/5ML IJ SOLN
INTRAMUSCULAR | Status: DC | PRN
  Administered 2021-05-19: 25 ug via INTRAVENOUS
  Administered 2021-05-19 (×3): 50 ug via INTRAVENOUS
  Administered 2021-05-19: 75 ug via INTRAVENOUS

## 2021-05-19 MED ORDER — DEXAMETHASONE SODIUM PHOSPHATE 10 MG/ML IJ SOLN
INTRAMUSCULAR | Status: DC | PRN
  Administered 2021-05-19: 5 mg via INTRAVENOUS

## 2021-05-19 MED ORDER — TRANEXAMIC ACID-NACL 1000-0.7 MG/100ML-% IV SOLN
INTRAVENOUS | Status: AC
  Filled 2021-05-19: qty 100

## 2021-05-19 MED ORDER — TRANEXAMIC ACID-NACL 1000-0.7 MG/100ML-% IV SOLN
INTRAVENOUS | Status: DC | PRN
  Administered 2021-05-19: 1000 mg via INTRAVENOUS

## 2021-05-19 MED ORDER — ALBUTEROL SULFATE HFA 108 (90 BASE) MCG/ACT IN AERS
INHALATION_SPRAY | RESPIRATORY_TRACT | Status: DC | PRN
  Administered 2021-05-19: 2 via RESPIRATORY_TRACT

## 2021-05-19 MED ORDER — DOCUSATE SODIUM 100 MG PO CAPS
100.0000 mg | ORAL_CAPSULE | Freq: Two times a day (BID) | ORAL | Status: DC
  Administered 2021-05-19 – 2021-05-23 (×7): 100 mg via ORAL
  Filled 2021-05-19 (×8): qty 1

## 2021-05-19 MED ORDER — ALBUTEROL SULFATE HFA 108 (90 BASE) MCG/ACT IN AERS
INHALATION_SPRAY | RESPIRATORY_TRACT | Status: AC
  Filled 2021-05-19: qty 6.7

## 2021-05-19 MED ORDER — MIDAZOLAM HCL 2 MG/2ML IJ SOLN
INTRAMUSCULAR | Status: DC | PRN
  Administered 2021-05-19 (×2): 1 mg via INTRAVENOUS

## 2021-05-19 MED ORDER — LACTATED RINGERS IV SOLN: INTRAVENOUS | Status: DC

## 2021-05-19 MED ORDER — SENNA 8.6 MG PO TABS
1.0000 | ORAL_TABLET | Freq: Two times a day (BID) | ORAL | Status: DC
  Administered 2021-05-19 – 2021-05-23 (×8): 8.6 mg via ORAL
  Filled 2021-05-19 (×8): qty 1

## 2021-05-19 MED ORDER — PHENYLEPHRINE HCL-NACL 10-0.9 MG/250ML-% IV SOLN
INTRAVENOUS | Status: DC | PRN
  Administered 2021-05-19: 25 ug/min via INTRAVENOUS

## 2021-05-19 MED ORDER — MIDAZOLAM HCL 2 MG/2ML IJ SOLN
INTRAMUSCULAR | Status: AC
  Filled 2021-05-19: qty 2

## 2021-05-19 MED ORDER — ONDANSETRON HCL 4 MG PO TABS: 4.0000 mg | ORAL_TABLET | Freq: Four times a day (QID) | ORAL | Status: DC | PRN

## 2021-05-19 MED ORDER — METHOCARBAMOL 500 MG IVPB - SIMPLE MED
500.0000 mg | Freq: Once | INTRAVENOUS | Status: AC
  Administered 2021-05-19: 500 mg via INTRAVENOUS

## 2021-05-19 MED ORDER — OXYCODONE HCL 5 MG/5ML PO SOLN: 5.0000 mg | Freq: Once | ORAL | Status: DC | PRN

## 2021-05-19 MED ORDER — ALBUMIN HUMAN 5 % IV SOLN: INTRAVENOUS | Status: DC | PRN

## 2021-05-19 MED ORDER — PROPOFOL 10 MG/ML IV BOLUS
INTRAVENOUS | Status: DC | PRN
  Administered 2021-05-19: 130 mg via INTRAVENOUS

## 2021-05-19 MED ORDER — ONDANSETRON HCL 4 MG/2ML IJ SOLN
INTRAMUSCULAR | Status: DC | PRN
  Administered 2021-05-19: 4 mg via INTRAVENOUS

## 2021-05-19 MED ORDER — PROMETHAZINE HCL 25 MG/ML IJ SOLN: 6.2500 mg | INTRAMUSCULAR | Status: DC | PRN

## 2021-05-19 MED ORDER — CHLORHEXIDINE GLUCONATE 0.12 % MT SOLN
15.0000 mL | Freq: Once | OROMUCOSAL | Status: AC
  Administered 2021-05-19: 15 mL via OROMUCOSAL

## 2021-05-19 MED ORDER — ENOXAPARIN SODIUM 40 MG/0.4ML IJ SOSY
40.0000 mg | PREFILLED_SYRINGE | INTRAMUSCULAR | Status: DC
  Administered 2021-05-20 – 2021-05-23 (×4): 40 mg via SUBCUTANEOUS
  Filled 2021-05-19 (×4): qty 0.4

## 2021-05-19 MED ORDER — OXYCODONE HCL 5 MG PO TABS: 5.0000 mg | ORAL_TABLET | Freq: Once | ORAL | Status: DC | PRN

## 2021-05-19 MED ORDER — ACETAMINOPHEN 10 MG/ML IV SOLN
INTRAVENOUS | Status: AC
  Filled 2021-05-19: qty 100

## 2021-05-19 MED ORDER — SODIUM CHLORIDE 0.9 % IR SOLN
Status: DC | PRN
  Administered 2021-05-19: 1000 mL

## 2021-05-19 MED ORDER — METOCLOPRAMIDE HCL 5 MG PO TABS: 5.0000 mg | ORAL_TABLET | Freq: Three times a day (TID) | ORAL | Status: DC | PRN

## 2021-05-19 MED ORDER — METOCLOPRAMIDE HCL 5 MG/ML IJ SOLN
5.0000 mg | Freq: Three times a day (TID) | INTRAMUSCULAR | Status: DC | PRN
Start: 2021-05-19 — End: 2021-05-23

## 2021-05-19 MED ORDER — ROCURONIUM BROMIDE 10 MG/ML (PF) SYRINGE
PREFILLED_SYRINGE | INTRAVENOUS | Status: DC | PRN
  Administered 2021-05-19: 50 mg via INTRAVENOUS

## 2021-05-19 MED ORDER — ONDANSETRON HCL 4 MG/2ML IJ SOLN: 4.0000 mg | Freq: Four times a day (QID) | INTRAMUSCULAR | Status: DC | PRN

## 2021-05-19 MED ORDER — IRRISEPT - 450ML BOTTLE WITH 0.05% CHG IN STERILE WATER, USP 99.95% OPTIME
TOPICAL | Status: DC | PRN
  Administered 2021-05-19: 450 mL

## 2021-05-19 MED ORDER — ACETAMINOPHEN 10 MG/ML IV SOLN
1000.0000 mg | Freq: Once | INTRAVENOUS | Status: DC | PRN
  Administered 2021-05-19: 1000 mg via INTRAVENOUS

## 2021-05-19 MED ORDER — ISOPROPYL ALCOHOL 70 % SOLN
Status: AC
  Filled 2021-05-19: qty 480

## 2021-05-19 SURGICAL SUPPLY — 58 items
APL PRP STRL LF DISP 70% ISPRP (MISCELLANEOUS) ×2
BAG SPEC THK2 15X12 ZIP CLS (MISCELLANEOUS) ×1
BAG ZIPLOCK 12X15 (MISCELLANEOUS) ×2 IMPLANT
BANDAGE ESMARK 6X9 LF (GAUZE/BANDAGES/DRESSINGS) ×1 IMPLANT
BNDG CMPR 9X6 STRL LF SNTH (GAUZE/BANDAGES/DRESSINGS) ×1
BNDG ESMARK 6X9 LF (GAUZE/BANDAGES/DRESSINGS) ×2
BNDG GAUZE ELAST 4 BULKY (GAUZE/BANDAGES/DRESSINGS) ×1 IMPLANT
CANISTER PREVENA PLUS 150 (CANNISTER) ×1 IMPLANT
CANISTER WOUNDNEG PRESSURE 500 (CANNISTER) ×1 IMPLANT
CHLORAPREP W/TINT 26 (MISCELLANEOUS) ×3 IMPLANT
COVER SURGICAL LIGHT HANDLE (MISCELLANEOUS) ×2 IMPLANT
COVER WAND RF STERILE (DRAPES) IMPLANT
CUFF TOURN SGL QUICK 18X4 (TOURNIQUET CUFF) ×1 IMPLANT
CUFF TOURN SGL QUICK 24 (TOURNIQUET CUFF)
CUFF TOURN SGL QUICK 34 (TOURNIQUET CUFF)
CUFF TRNQT CYL 24X4X16.5-23 (TOURNIQUET CUFF) ×1 IMPLANT
CUFF TRNQT CYL 34X4.125X (TOURNIQUET CUFF) ×1 IMPLANT
DRAIN CHANNEL 10F 3/8 F FF (DRAIN) ×1 IMPLANT
DRAIN PENROSE 0.5X18 (DRAIN) ×1 IMPLANT
DRESSING PREVENA PLUS CUSTOM (GAUZE/BANDAGES/DRESSINGS) IMPLANT
DRSG PAD ABDOMINAL 8X10 ST (GAUZE/BANDAGES/DRESSINGS) ×1 IMPLANT
DRSG PREVENA PLUS CUSTOM (GAUZE/BANDAGES/DRESSINGS) ×2
DRSG TEGADERM 4X4.75 (GAUZE/BANDAGES/DRESSINGS) ×1 IMPLANT
ELECT REM PT RETURN 15FT ADLT (MISCELLANEOUS) ×2 IMPLANT
EVACUATOR SILICONE 100CC (DRAIN) ×1 IMPLANT
GAUZE SPONGE 4X4 12PLY STRL (GAUZE/BANDAGES/DRESSINGS) ×2 IMPLANT
GLOVE SRG 8 PF TXTR STRL LF DI (GLOVE) ×1 IMPLANT
GLOVE SURG ENC TEXT LTX SZ7 (GLOVE) IMPLANT
GLOVE SURG ENC TEXT LTX SZ7.5 (GLOVE) ×4 IMPLANT
GLOVE SURG LTX SZ8 (GLOVE) IMPLANT
GLOVE SURG ORTHO LTX SZ7.5 (GLOVE) ×4 IMPLANT
GLOVE SURG ORTHO LTX SZ8 (GLOVE) ×2 IMPLANT
GLOVE SURG UNDER POLY LF SZ7.5 (GLOVE) ×2 IMPLANT
GLOVE SURG UNDER POLY LF SZ8 (GLOVE) ×2
GLOVE SURG UNDER POLY LF SZ8.5 (GLOVE) ×2 IMPLANT
GOWN STRL REUS W/TWL LRG LVL3 (GOWN DISPOSABLE) ×2 IMPLANT
GOWN STRL REUS W/TWL XL LVL3 (GOWN DISPOSABLE) ×4 IMPLANT
HANDPIECE INTERPULSE COAX TIP (DISPOSABLE) ×2
JET LAVAGE IRRISEPT WOUND (IRRIGATION / IRRIGATOR) ×2
KIT BASIN OR (CUSTOM PROCEDURE TRAY) ×2 IMPLANT
KIT TURNOVER KIT A (KITS) ×2 IMPLANT
LAVAGE JET IRRISEPT WOUND (IRRIGATION / IRRIGATOR) IMPLANT
MANIFOLD NEPTUNE II (INSTRUMENTS) ×2 IMPLANT
PACK ORTHO EXTREMITY (CUSTOM PROCEDURE TRAY) ×2 IMPLANT
PAD CAST 4YDX4 CTTN HI CHSV (CAST SUPPLIES) ×1 IMPLANT
PADDING CAST COTTON 4X4 STRL (CAST SUPPLIES)
PENCIL SMOKE EVACUATOR (MISCELLANEOUS) ×1 IMPLANT
PROTECTOR NERVE ULNAR (MISCELLANEOUS) ×2 IMPLANT
SET HNDPC FAN SPRY TIP SCT (DISPOSABLE) ×1 IMPLANT
SOL PREP PROV IODINE SCRUB 4OZ (MISCELLANEOUS) ×1 IMPLANT
SPONGE DRAIN TRACH 4X4 STRL 2S (GAUZE/BANDAGES/DRESSINGS) ×1 IMPLANT
SUT MON AB 2-0 CT1 36 (SUTURE) ×2 IMPLANT
SUT NYLON 3 0 (SUTURE) ×3 IMPLANT
SUT VLOC 180 0 9IN  GS21 (SUTURE) ×2
SUT VLOC 180 0 9IN GS21 (SUTURE) IMPLANT
SWAB CULTURE ESWAB REG 1ML (MISCELLANEOUS) ×2 IMPLANT
SYR CONTROL 10ML LL (SYRINGE) ×2 IMPLANT
TOWEL OR 17X26 10 PK STRL BLUE (TOWEL DISPOSABLE) ×4 IMPLANT

## 2021-05-19 NOTE — Progress Notes (Signed)
Initial Nutrition Assessment  INTERVENTION:   Once diet advanced: - Juven Fruit Punch BID, each serving provides 95kcal and 2.5g of protein (amino acids glutamine and arginine)  NUTRITION DIAGNOSIS:   Increased nutrient needs related to acute illness as evidenced by estimated needs.  GOAL:   Patient will meet greater than or equal to 90% of their needs  MONITOR:   PO intake,Supplement acceptance,Diet advancement,Labs,Weight trends,I & O's,Skin  REASON FOR ASSESSMENT:   Consult Assessment of nutrition requirement/status  ASSESSMENT:   40 year old female with past medical history of Down syndrome, hypothyroidism, asthma, gastroesophageal reflux disease, rheumatoid arthritis and recent left hip total arthroplasty on 4/27 who presents to [facility] long emergency department as a transfer from [facility] emergency department due to increasing drainage and tenderness at the surgical site.  Patient awaiting I&D today for left hip infection. S/p IR guided drainage 5/22. Currently NPO. Recommend Juven  once diet advanced. Was on a regular diet yesterday, consumed 100% of all meals.  Per weight records, last recorded weight is from 4/27 (165 lbs). Needs a weight for this admission.  Medications: Folic acid  Labs reviewed.  NUTRITION - FOCUSED PHYSICAL EXAM:  Unable to complete at this time.  Diet Order:   Diet Order            Diet NPO time specified  Diet effective now                 EDUCATION NEEDS:   No education needs have been identified at this time  Skin:  Skin Assessment: Skin Integrity Issues: Skin Integrity Issues:: Incisions Incisions: 4/27 hip  Last BM:  5/20  Height:   Ht Readings from Last 1 Encounters:  04/23/21 4\' 6"  (1.372 m)    Weight:   Wt Readings from Last 1 Encounters:  04/23/21 75.4 kg    BMI:  There is no height or weight on file to calculate BMI.  Estimated Nutritional Needs:   Kcal:   1200-1400  Protein:  60-75g  Fluid:  1.5L/day  04/25/21, MS, RD, LDN Inpatient Clinical Dietitian Contact information available via Amion

## 2021-05-19 NOTE — Progress Notes (Signed)
PROGRESS NOTE    Marissa Gray  DGU:[id number] DOB: 1981/05/07 DOA: 05/17/2021 PCP: Merri Brunette, MD   Brief Narrative: hpi per dr Leafy Half  40 year old female with past medical history of Down syndrome, hypothyroidism, asthma, gastroesophageal reflux disease, rheumatoid arthritis and recent left hip total arthroplasty on 4/27 who presents to [facility] emergency department as a transfer from [facility] emergency department due to increasing drainage and tenderness at the surgical site.  Patient is a somewhat poor historian and therefore majority history is obtained from the mother who is at the bedside.  Patient's mother explains that the patient has been participating with physical therapy at [facility] rehabilitation and has been making gains ever since being admitted there several weeks ago postoperatively.  Yesterday, patient suddenly was found to have a increasing drainage coming from the wound.  This drainage was associated with rapid progression of redness surrounding the wound, seeming to extend down the thigh towards the knee.  These findings rapidly progressed throughout the day and were associated with significant pain.  The patient reports that she has been experiencing substantial moderate to severe pain of the left thigh particularly surrounding the surgical wound.  She states that the pain is worse with movement or palpation of the area.  Due to this concerning progression of significant drainage and redness of the area the patient was sent to [facility] emergency department for evaluation.  Upon evaluation in the emergency department emergency department staff discussed the case with Dr. Linna Caprice with orthopedic surgery at [facility] health who recommended the patient be transferred from ED to ED with the hospitalist group to admit the patient and orthopedic surgery to consult.  Tentative plan is for patient to undergo IR guided  drainage of the area.  Upon arrival to [facility] emergency department the hospitalist group is now been called to assess patient for admission to the hospital.  Assessment & Plan:   Principal Problem:   Post-operative infection Active Problems:   Rheumatoid arthritis involving multiple sites [facility])   Gastroesophageal reflux disease without esophagitis   Moderate asthma without complication   Hypothyroidism   #1 left hip infection status post left total hip arthroplasty 04/23/2021 Dr. Lequita Halt.  Patient admitted with increasing left hip drainage and increasing pain.  Seen by Ortho Patient had IR guided drainage done 5/22 Planning for I&D today by Ortho. On vanco and merepenem  #2 asthma -stable  #3 gerd on ppi  #4 obesity counciled mom and patient   #5 downs syndrome-  #6 hypoalbuminemia cc/s dietary     Estimated body mass index is 40.08 kg/m as calculated from the following:   Height as of 04/23/21: 4\' 6"  (1.372 m).   Weight as of 04/23/21: 75.4 kg.  DVT prophylaxis:none pending surgery  Code Status: full Family Communication: Discussed with mother  disposition Plan:  Status is: Inpatient  Dispo: The patient is from: Home              Anticipated d/c is to: SNF              Patient currently not medically stable   difficult to place patient no  Consultants: ortho  Procedures Antimicrobials:vancomycin and meropenem   Subjective: Blood-tinged purulent drainage from the left hip this morning.  Awaiting to see Ortho.  Objective: Vitals:   05/19/21 0214 05/19/21 0532 05/19/21 1022 05/19/21 1400  BP: 107/66 107/67 112/90 130/88  Pulse: 78 87 81 79  Resp: 16 16  16 16  Temp: 98 F (36.7 C) (!) 97.2 F (36.2 C)  98.2 F (36.8 C)  TempSrc: Oral Oral    SpO2: 96% (!) 89% 100% 99%    Intake/Output Summary (Last 24 hours) at 05/19/2021 1438 Last data filed at 05/19/2021 1100 Gross per 24 hour  Intake 2746.64 ml  Output --  Net 2746.64 ml   There were  no vitals filed for this visit.  Examination:  General exam: Appears calm and comfortable  Respiratory system: Clear to auscultation. Respiratory effort normal. Cardiovascular system: S1 & S2 heard, RRR. No JVD, murmurs, rubs, gallops or clicks. No pedal edema. Gastrointestinal system: Abdomen is nondistended, soft and nontender. No organomegaly or masses felt. Normal bowel sounds heard. Central nervous system: Alert and oriented. No focal neurological deficits. Extremities: Left hip incision is covered with dressing but does have purulent drainage and erythema   Skin: left hip erythema Psychiatry: Judgement and insight appear normal. Mood & affect appropriate.     Data Reviewed: I have personally reviewed following labs and imaging studies  CBC: Recent Labs  Lab 05/17/21 2209 05/19/21 0254  WBC 7.2 4.4  NEUTROABS 5.3  --   HGB 9.7* 8.7*  HCT 30.5* 27.2*  MCV 108.2* 109.2*  PLT 486* 442*   Basic Metabolic Panel: Recent Labs  Lab 05/18/21 0352 05/19/21 0254  NA 139 140  K 4.0 4.0  CL 109 109  CO2 24 25  GLUCOSE 91 99  BUN 9 10  CREATININE 0.57 0.70  CALCIUM 8.1* 7.7*   GFR: CrCl cannot be calculated (Unknown ideal weight.). Liver Function Tests: Recent Labs  Lab 05/18/21 0352 05/19/21 0254  AST 16 20  ALT 18 23  ALKPHOS 203* 187*  BILITOT 0.5 0.3  PROT 6.8 6.3*  ALBUMIN 2.4* 2.3*   No results for input(s): LIPASE, AMYLASE in the last 168 hours. No results for input(s): AMMONIA in the last 168 hours. Coagulation Profile: Recent Labs  Lab 05/18/21 0352  INR 1.2   Cardiac Enzymes: No results for input(s): CKTOTAL, CKMB, CKMBINDEX, TROPONINI in the last 168 hours. BNP (last 3 results) No results for input(s): PROBNP in the last 8760 hours. HbA1C: No results for input(s): HGBA1C in the last 72 hours. CBG: No results for input(s): GLUCAP in the last 168 hours. Lipid Profile: No results for input(s): CHOL, HDL, LDLCALC, TRIG, CHOLHDL, LDLDIRECT in  the last 72 hours. Thyroid Function Tests: No results for input(s): TSH, T4TOTAL, FREET4, T3FREE, THYROIDAB in the last 72 hours. Anemia Panel: No results for input(s): VITAMINB12, FOLATE, FERRITIN, TIBC, IRON, RETICCTPCT in the last 72 hours. Sepsis Labs: No results for input(s): PROCALCITON, LATICACIDVEN in the last 168 hours.  Recent Results (from the past 240 hour(s))  SARS CORONAVIRUS 2 (TAT 6-24 HRS) Nasopharyngeal Nasopharyngeal Swab     Status: None   Collection Time: 05/17/21 10:04 PM   Specimen: Nasopharyngeal Swab  Result Value Ref Range Status   SARS Coronavirus 2 NEGATIVE NEGATIVE Final    Comment: (NOTE) SARS-CoV-2 target nucleic acids are NOT DETECTED.  The SARS-CoV-2 RNA is generally detectable in upper and lower respiratory specimens during the acute phase of infection. Negative results do not preclude SARS-CoV-2 infection, do not rule out co-infections with other pathogens, and should not be used as the sole basis for treatment or other patient management decisions. Negative results must be combined with clinical observations, patient history, and epidemiological information. The expected result is Negative.  Fact Sheet for Patients: HairSlick.no  Fact Sheet for  Healthcare Providers: quierodirigir.com  This test is not yet approved or cleared by the [place] and  has been authorized for detection and/or diagnosis of SARS-CoV-2 by FDA under an Emergency Use Authorization (EUA). This EUA will remain  in effect (meaning this test can be used) for the duration of the COVID-19 declaration under Se ction 564(b)(1) of the Act, 21 U.S.C. section 360bbb-3(b)(1), unless the authorization is terminated or revoked sooner.  Performed at [facility], [address], [place], [place] [address]   Culture, blood (routine x 2)     Status: None (Preliminary result)   Collection Time: 05/18/21  7:55 AM    Specimen: BLOOD  Result Value Ref Range Status   Specimen Description   Final    BLOOD RIGHT ANTECUBITAL Performed at [facility], [address], [place], [place] [address]    Special Requests   Final    BOTTLES DRAWN AEROBIC ONLY Blood Culture adequate volume Performed at [facility], [address], [place], [place] [address]    Culture   Final    NO GROWTH < 24 HOURS Performed at [facility], [address], [place], [place] [address]    Report Status PENDING  Incomplete  Culture, blood (routine x 2)     Status: None (Preliminary result)   Collection Time: 05/18/21  7:55 AM   Specimen: BLOOD  Result Value Ref Range Status   Specimen Description   Final    BLOOD RIGHT ANTECUBITAL Performed at [facility], [address], [place], [place] [address]    Special Requests   Final    BOTTLES DRAWN AEROBIC ONLY Blood Culture adequate volume Performed at [facility], [address], [place], [place] [address]    Culture   Final    NO GROWTH < 24 HOURS Performed at [facility], [address], [place], [place] [address]    Report Status PENDING  Incomplete  Body fluid culture w Gram Stain     Status: None (Preliminary result)   Collection Time: 05/18/21 12:00 PM   Specimen: PATH Cytology Misc. fluid; Synovial Fluid  Result Value Ref Range Status   Specimen Description HIP  Final   Special Requests LEFT HIP  Final   Gram Stain   Final    RARE WBC PRESENT, PREDOMINANTLY MONONUCLEAR NO ORGANISMS SEEN    Culture   Final    NO GROWTH < 24 HOURS Performed at [facility], [address] [place], [place] [address]    Report Status PENDING  Incomplete  MRSA PCR Screening     Status: Abnormal   Collection Time: 05/18/21 12:30 PM   Specimen: Nasal Mucosa; Nasopharyngeal  Result Value Ref Range Status   MRSA by PCR POSITIVE (A) NEGATIVE Final    Comment:        The GeneXpert MRSA  Assay (FDA approved for NASAL specimens only), is one component of a comprehensive MRSA colonization surveillance program. It is not intended to diagnose MRSA infection nor to guide or monitor treatment for MRSA infections. RESULT CALLED TO, READ BACK BY AND VERIFIED WITH: MILLER,J. RN AT 1436 05/18/21 MULLINS,T Performed at [facility], [address], [place], [place] [address]          Radiology Studies: DG FLUORO GUIDED NEEDLE [facility] ASPIRATION/INJECTION LOC  Result Date: 05/18/2021 CLINICAL DATA:  Patient with drainage from the lateral left hip. Patient had a  total left hip arthroplasty performed on 04/23/2021. 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 EXAM: LEFT HIP INJECTION UNDER FLUOROSCOPY COMPARISON:  Current left hip radiographs. FLUOROSCOPY TIME:  Fluoroscopy Time:  0 minutes and 48 seconds. Radiation Exposure Index (if provided by the fluoroscopic device): 12.3 mGy Number of Acquired Spot Images: 1 PROCEDURE: Overlying skin prepped with Betadine, draped in the usual sterile fashion, and infiltrated locally with buffered Lidocaine. A 20 gauge spinal needle advanced to the junction of the left femoral prosthesis head and neck. 1 ml of Lidocaine injected easily. The hip was then aspirated with a total of 2 mL of bloody fluid acquired. No additional fluid could be aspirated. Fluid was sent for laboratory analysis. IMPRESSION: Fluoroscopic guided aspiration of the left hip without complication. 2 mL of bloody fluid acquired. Electronically Signed   By: Amie Portland M.D.   On: 05/18/2021 12:40   DG HIP PORT UNILAT WITH PELVIS 1V LEFT  Result Date: 05/18/2021 CLINICAL DATA:  Status post total hip replacement. EXAM: DG HIP (WITH OR WITHOUT PELVIS) 1V PORT LEFT COMPARISON:  Pelvic radiograph 04/23/2021 FINDINGS: Left hip arthroplasty in expected alignment no periprosthetic lucency or fracture. Some developing heterotopic calcification is noted adjacent  to the greater trochanter and lateral acetabulum. Previous postsurgical soft tissue air and edema has resolved. Remainder of the bony pelvis is intact. IMPRESSION: 1. Left hip arthroplasty in expected alignment. No periprosthetic lucency or fracture. 2. Developing heterotopic calcification adjacent to the greater trochanter and lateral acetabulum. Electronically Signed   By: Narda Rutherford M.D.   On: 05/18/2021 01:06        Scheduled Meds: . Chlorhexidine Gluconate Cloth  6 each Topical Q0600  . folic acid  1 mg Oral Daily  . mupirocin ointment  1 application Nasal BID  . pantoprazole  40 mg Oral Daily  . sodium chloride  1 spray Nasal BID   Continuous Infusions: . sodium chloride 100 mL/hr at 05/19/21 0639  . meropenem (MERREM) IV 1 g (05/19/21 1411)  . vancomycin Stopped (05/18/21 1803)     LOS: 2 days   Alwyn Ren, MD 05/19/2021, 2:38 PM

## 2021-05-19 NOTE — Op Note (Signed)
OPERATIVE REPORT   05/19/2021  7:23 PM  PATIENT:  Marissa Gray   SURGEON:  Jonette Pesa, MD  ASSISTANT:  Barrie Dunker, PA-C.   PREOPERATIVE DIAGNOSIS:  LEFT HIP SURGICAL SITE INFECTION  POSTOPERATIVE DIAGNOSIS:  Same.  PROCEDURE:  Excisional debridement of skin and subcutaneous tissue left hip.  ANESTHESIA:   GETA.  ANTIBIOTICS:  750 g Vancomycin.  IMPLANTS:  None.  EXPLANTS: None.  TUBES AND DRAINS: 1. 10 mm flat JP drain in subcutaneous tissue. 2. Prevena incisional negative pressure dressing.  SPECIMENS:  Left hip superficial abscess fluid swap for culture.  COMPLICATIONS:  None.  DISPOSITION:  Stable to PACU.  SURGICAL INDICATIONS:  Marissa Gray is a 40 y.o. female who underwent primary left total hip arthroplasty posterior approach by Dr. Lequita Halt on 04/23/2021.  She was discharged to a SNF and has been taking Xarelto for DVT prophylaxis.  She developed serous wound drainage on May 8 which resolved.  She began having drainage again on 05/15/2021 which significantly worsened over the next couple of days.  She was brought to the emergency department at an outside hospital and then transferred to [facility].  She was seen in the emergency department by the hospitalist and admitted.  She was started on IV vancomycin and meropenem.  Interventional radiology was consulted for fluoroscopic guided aspiration of the left hip.  Synovial fluid analysis was consistent with normal postoperative fluid per the Musculoskeletal Infection Society protocol.  She was then indicated for I&D of the left hip, surgical exploration, and possible head ball and liner exchange.  The risk, benefits, and alternatives were discussed with her mother, and she elected to proceed.  PROCEDURE IN DETAIL: The patient was identified in the holding area using 2 identifiers.  The surgical site was marked by myself.  She was taken to the operating room, placed supine on the operating room table.   General anesthesia was induced.  She was then flipped to the right lateral decubitus position.  An axillary roll was placed.  All bony prominences were well-padded.  She was secured to the table with lateral hip positioner.  The left hip was prepped and draped in the normal sterile surgical fashion.  Timeout was called, verifying side and site of surgery.  She did receive IV vancomycin within 60 minutes of beginning the procedure.  I examined her left hip incision.  There was no dehiscence.  She had significant erythema distally.  She had active seropurulent drainage from the distal third of the incision.  Using a #10 blade, I ellipsed the skin and subcutaneous tissue from the area of drainage.  I excised her previous incision approximately as well.  Upon entering the subcutaneous tissue, there was a pocket of seropurulent fluid that I encountered.  Representative swab was sent for culture.  Suction was used to evacuate all of the fluid.  I then created full-thickness skin flaps and proceeded down to the level of the fascia.  All nonviable subcutaneous fatty tissue was excisionally debrided with a #10 blade and a rongeur.  I then examined her fascial closure, which was watertight without any compromise.  The wound was then irrigated with 3 L of normal saline using pulse lavage.  Irrisept was used to irrigate the wound.  Another 3 L of normal saline irrigant was used with pulse lavage.  I was satisfied at this point that all nonviable tissue had been removed.  I did not violate the fascial repair which was intact.  The  deep fatty tissue was closed with 0 V-Loc over a 10 mm flat JP drain which was brought out through a separate stab wound in line with the incision distally.  The JP drain was then sewn in with a 3-0 nylon stitch.  The deep dermal layer was closed with 2-0 Monocryl.  Skin was closed with 2-0 nylon using vertical mattress sutures.  A customizable Prevena negative pressure incisional dressing was  applied according to manufacturer's instructions.  Suction was applied at 125 mmHg without any leak.  The patient was then flipped supine, extubated, and taken to the PACU in stable condition.  Sponge, needle, and instrument counts were correct at the end of the case x2.  There were no known complications.  Debridement type: Excisional Debridement  Side: right  Body Location: hip   Tools used for debridement: scalpel, curette and rongeur  Pre-debridement Wound size (cm):   Length: N/A        Width: N/A     Depth: N/A   Post-debridement Wound size (cm):   Length: N/A        Width: N/A     Depth: N/A   Debridement depth beyond dead/damaged tissue down to healthy viable tissue: yes  Tissue layer involved: skin, subcutaneous tissue  Nature of tissue removed: Non-viable tissue and Purulence  Irrigation volume: 6 L     Irrigation fluid type: Normal Saline Irrisept  POSTOPERATIVE PLAN: Postoperatively, the patient will be readmitted to the hospitalist service.  She may weight-bear as tolerated left lower extremity with a walker.  Continue posterior hip precautions.  Continue IV vancomycin and meropenem for now.  Follow intraoperative culture, and tailor antibiotics appropriately.  Patient will need infectious disease consult for antibiotic selection and duration.  She will likely need a PICC line for long-term IV antibiotics.  Beginning tomorrow morning, start Lovenox for DVT prophylaxis.  Mobilize out of bed with PT/OT.  She will undergo disposition planning.  Upon discharge, exchange house wound VAC for portable Prevena incisional wound VAC.  We will plan for discharge with JP drain, likely to be removed at her first postop visit.  She will need to follow-up in the office within 7 days of discharge for wound VAC removal.

## 2021-05-19 NOTE — Anesthesia Preprocedure Evaluation (Signed)
Anesthesia Evaluation  Patient identified by MRN, date of birth, ID band Patient awake    Reviewed: Allergy & Precautions, NPO status , Patient's Chart, lab work & pertinent test results  Airway Mallampati: III  TM Distance: >3 FB Neck ROM: Full    Dental  (+) Teeth Intact   Pulmonary asthma ,    Pulmonary exam normal        Cardiovascular negative cardio ROS   Rhythm:Regular Rate:Normal     Neuro/Psych negative neurological ROS  negative psych ROS   GI/Hepatic Neg liver ROS, GERD  Medicated,  Endo/Other  Hypothyroidism   Renal/GU negative Renal ROS  negative genitourinary   Musculoskeletal  (+) Arthritis , Rheumatoid disorders,    Abdominal (+)  Abdomen: soft. Bowel sounds: normal.  Peds  Hematology negative hematology ROS (+)   Anesthesia Other Findings   Reproductive/Obstetrics                             Anesthesia Physical Anesthesia Plan  ASA: III  Anesthesia Plan: General   Post-op Pain Management:    Induction: Intravenous  PONV Risk Score and Plan: 3 and Ondansetron, Dexamethasone, Midazolam and Treatment may vary due to age or medical condition  Airway Management Planned: Mask and Oral ETT  Additional Equipment: None  Intra-op Plan:   Post-operative Plan: Extubation in OR  Informed Consent: I have reviewed the patients History and Physical, chart, labs and discussed the procedure including the risks, benefits and alternatives for the proposed anesthesia with the patient or authorized representative who has indicated his/her understanding and acceptance.     Dental advisory given  Plan Discussed with:   Anesthesia Plan Comments:         Anesthesia Quick Evaluation

## 2021-05-19 NOTE — Interval H&P Note (Signed)
History and Physical Interval Note:  05/19/2021 4:55 PM  Marissa Gray  has presented today for surgery, with the diagnosis of LEFT HIP SURGICAL SITE INFECTION.  The various methods of treatment have been discussed with the patient and family. After consideration of risks, benefits and other options for treatment, the patient has consented to  Procedure(s): IRRIGATION AND DEBRIDEMENT LEFT HIP WITH POSSIBLE HEAD AND LINER EXCHANGE (Left) as a surgical intervention.  The patient's history has been reviewed, patient examined, no change in status, stable for surgery.  I have reviewed the patient's chart and labs.  Questions were answered to the patient's satisfaction.    Discussed situation with the patient's mother. Hip aspiration suggests the infection is superficial. Plan to explore fascial closure intraop. Head and liner exchange if necessary. We discussed biggest risk includes recurrence of infection.   Marissa Gray

## 2021-05-19 NOTE — Transfer of Care (Signed)
Immediate Anesthesia Transfer of Care Note  Patient: Marissa Gray  Procedure(s) Performed: Procedure(s): IRRIGATION AND DEBRIDEMENT LEFT HIP WITH POSSIBLE HEAD AND LINER EXCHANGE (Left)  Patient Location: PACU  Anesthesia Type:General  Level of Consciousness: Alert, Awake, Oriented  Airway & Oxygen Therapy: Patient Spontanous Breathing  Post-op Assessment: Report given to RN  Post vital signs: Reviewed and stable  Last Vitals:  Vitals:   05/19/21 1022 05/19/21 1400  BP: 112/90 130/88  Pulse: 81 79  Resp: 16 16  Temp:  36.8 C  SpO2: 100% 99%    Complications: No apparent anesthesia complications

## 2021-05-19 NOTE — Anesthesia Procedure Notes (Signed)
Procedure Name: Intubation Date/Time: 05/19/2021 5:36 PM Performed by: Minerva Ends, CRNA Pre-anesthesia Checklist: Patient identified, Emergency Drugs available, Suction available and Patient being monitored Patient Re-evaluated:Patient Re-evaluated prior to induction Oxygen Delivery Method: Circle System Utilized Preoxygenation: Pre-oxygenation with 100% oxygen Induction Type: IV induction Ventilation: Mask ventilation without difficulty Laryngoscope Size: Glidescope Grade View: Grade I Tube type: Oral Tube size: 6.5 mm Number of attempts: 1 Airway Equipment and Method: Stylet Placement Confirmation: ETT inserted through vocal cords under direct vision,  positive ETCO2 and breath sounds checked- equal and bilateral Secured at: 20 cm Tube secured with: Tape Dental Injury: Teeth and Oropharynx as per pre-operative assessment  Difficulty Due To: Difficulty was anticipated, Difficult Airway- due to anterior larynx, Difficult Airway- due to limited oral opening and Difficult Airway-  due to neck instability Future Recommendations: Recommend- induction with short-acting agent, and alternative techniques readily available Comments: IV induction Stoltzfus-- intubation AM CRNA atraumatic-- teeth and mouth as preop- neck maintained in neutral position for intubation with Glidescope- bilat BS

## 2021-05-19 NOTE — Discharge Instructions (Signed)
Keep VAC dressing clean and dry. Do NOT remove. Charge VAC unit nightly with included cord. Every 8 hours, empty JP drain, record drainage on log sheet, and recharge drain. Bring drainage log sheet to follow up appointment. Call [phone] ASAP to schedule a follow up appointment with 7 days of leaving [facility].

## 2021-05-20 LAB — CBC
HCT: 23.3 % — ABNORMAL LOW (ref 36.0–46.0)
HCT: 23.6 % — ABNORMAL LOW (ref 36.0–46.0)
Hemoglobin: 7.4 g/dL — ABNORMAL LOW (ref 12.0–15.0)
Hemoglobin: 7.4 g/dL — ABNORMAL LOW (ref 12.0–15.0)
MCH: 34.1 pg — ABNORMAL HIGH (ref 26.0–34.0)
MCH: 34.4 pg — ABNORMAL HIGH (ref 26.0–34.0)
MCHC: 31.4 g/dL (ref 30.0–36.0)
MCHC: 31.8 g/dL (ref 30.0–36.0)
MCV: 108.4 fL — ABNORMAL HIGH (ref 80.0–100.0)
MCV: 108.8 fL — ABNORMAL HIGH (ref 80.0–100.0)
Platelets: 379 10*3/uL (ref 150–400)
Platelets: 381 10*3/uL (ref 150–400)
RBC: 2.15 MIL/uL — ABNORMAL LOW (ref 3.87–5.11)
RBC: 2.17 MIL/uL — ABNORMAL LOW (ref 3.87–5.11)
RDW: 13.5 % (ref 11.5–15.5)
RDW: 13.7 % (ref 11.5–15.5)
WBC: 5 10*3/uL (ref 4.0–10.5)
WBC: 5.2 10*3/uL (ref 4.0–10.5)
nRBC: 0.6 % — ABNORMAL HIGH (ref 0.0–0.2)
nRBC: 0.6 % — ABNORMAL HIGH (ref 0.0–0.2)

## 2021-05-20 LAB — BASIC METABOLIC PANEL
Anion gap: 7 (ref 5–15)
BUN: 5 mg/dL — ABNORMAL LOW (ref 6–20)
CO2: 25 mmol/L (ref 22–32)
Calcium: 8 mg/dL — ABNORMAL LOW (ref 8.9–10.3)
Chloride: 106 mmol/L (ref 98–111)
Creatinine, Ser: 0.52 mg/dL (ref 0.44–1.00)
GFR, Estimated: >60 mL/min (ref >60)
Glucose, Bld: 114 mg/dL — ABNORMAL HIGH (ref 70–99)
Potassium: 4.4 mmol/L (ref 3.5–5.1)
Sodium: 138 mmol/L (ref 135–145)

## 2021-05-20 LAB — CREATININE, SERUM
Creatinine, Ser: 0.53 mg/dL (ref 0.44–1.00)
GFR, Estimated: >60 mL/min (ref >60)

## 2021-05-20 MED ORDER — JUVEN PO PACK
1.0000 | PACK | Freq: Two times a day (BID) | ORAL | Status: DC
  Administered 2021-05-20 – 2021-05-23 (×7): 1 via ORAL
  Filled 2021-05-20 (×8): qty 1

## 2021-05-20 NOTE — Evaluation (Signed)
Occupational Therapy Evaluation Patient Details Name: Marissa Gray MRN: [id number] DOB: 1981/03/22 Today's Date: 05/20/2021    History of Present Illness Patient is a 40 year old woman with Down's Syndrome s/p recent  left posterior THA per Dr Lequita Halt. admitted with surgical site infection now s/p Excisional debridement of skin and subcutaneous tissue left hip per Dr Linna Caprice on 05/19/21   Clinical Impression   Pt admitted with the diagnosis listed above and has the deficits listed below. Pt would benefit from cont OT to increase independence with basic adls and adl transfers so pt can eventually return to her group home.  Pt is very motivated but will need rehab before going to group home. Pt's mother very supportive and pt recalls much of what she learned in therapy prior to previous d/c to SNF.  Will follow acutely for adls in standing and use of adaptive equipment for all adls.    Follow Up Recommendations  SNF;Supervision/Assistance - 24 hour    Equipment Recommendations  None recommended by OT    Recommendations for Other Services       Precautions / Restrictions Precautions Precautions: Posterior Hip Precaution Booklet Issued: Yes (comment) Precaution Comments: Pt recalls hip precautions but required explanation of not breaking 90 degrees hip flexion. Restrictions Weight Bearing Restrictions: Yes LLE Weight Bearing: Weight bearing as tolerated Other Position/Activity Restrictions: Pt with hemovac in L hip and wound vac.      Mobility Bed Mobility Overal bed mobility: Needs Assistance Bed Mobility: Supine to Sit     Supine to sit: Mod assist;HOB elevated     General bed mobility comments: given extra time and encouraging mom to step back, pt was able to get to EOB with assist to get feet on the floor with use of pad.    Transfers Overall transfer level: Needs assistance Equipment used: Rolling walker (2 wheeled) Transfers: Sit to/from [employer] Sit to Stand: Mod assist Stand pivot transfers: Mod assist       General transfer comment: cues for hand placement on walker. Feel with pediatric walker, pt may have had better success.    Balance Overall balance assessment: Needs assistance Sitting-balance support: No upper extremity supported;Feet supported Sitting balance-Leahy Scale: Good     Standing balance support: Bilateral upper extremity supported;During functional activity Standing balance-Leahy Scale: Poor Standing balance comment: Pt heavily reliant on walker.                           ADL either performed or assessed with clinical judgement   ADL Overall ADL's : Needs assistance/impaired Eating/Feeding: Independent   Grooming: Wash/dry hands;Wash/dry face;Oral care;Set up;Sitting Grooming Details (indicate cue type and reason): Pt not ready to stand for extended period of time during this session. Upper Body Bathing: Set up;Sitting   Lower Body Bathing: Moderate assistance;Sit to/from stand;Cueing for compensatory techniques   Upper Body Dressing : Set up;Sitting   Lower Body Dressing: Maximal assistance;Sit to/from stand;Sitting/lateral leans;Cueing for compensatory techniques Lower Body Dressing Details (indicate cue type and reason): Pt may benefit from pediatric reacher as hands are so small it is hard to use regular reacher for dressing. Toilet Transfer: Moderate assistance;Stand-pivot;BSC;RW Toilet Transfer Details (indicate cue type and reason): pivot to [facility].  Needs regular BSC in room due to pt being 4'6" Toileting- Clothing Manipulation and Hygiene: Maximal assistance;Sit to/from stand Toileting - Clothing Manipulation Details (indicate cue type and reason): assist to clean self. Pt has toilet aid  that is at the SNF     Functional mobility during ADLs: Moderate assistance;Rolling walker (just pivoted to chair.) General ADL Comments: Pt very motivated to be independent.  Mother  states pt did not get a lot of OT at SNF but would benefit. Feel, with therapy, pt can make good progress.     Vision Baseline Vision/History: Wears glasses Wears Glasses: At all times Patient Visual Report: No change from baseline Vision Assessment?: No apparent visual deficits     Perception Perception Perception Tested?: No   Praxis Praxis Praxis tested?: Within functional limits    Pertinent Vitals/Pain Pain Assessment: Faces Faces Pain Scale: Hurts little more Pain Location: L hip Pain Descriptors / Indicators: Tender;Tightness;Operative site guarding Pain Intervention(s): Limited activity within patient's tolerance;Monitored during session;Repositioned;Premedicated before session     Hand Dominance Right   Extremity/Trunk Assessment Upper Extremity Assessment Upper Extremity Assessment: LUE deficits/detail LUE Deficits / Details: Shoulder flexion and abduction to 90 due to rheumatoid arthritis. LUE: Unable to fully assess due to pain LUE Sensation: WNL LUE Coordination: decreased gross motor   Lower Extremity Assessment Lower Extremity Assessment: Defer to PT evaluation   Cervical / Trunk Assessment Cervical / Trunk Assessment: Normal   Communication Communication Communication: No difficulties   Cognition Arousal/Alertness: Awake/alert Behavior During Therapy: WFL for tasks assessed/performed Overall Cognitive Status: Within Functional Limits for tasks assessed                                 General Comments: Pt with good memory and recall of previous surgeries and how to do things the way therapists have instructed.  Very motivated.   General Comments  Pt very motivated and works hard.  Does well directing her care recalling what she has learned in therapy so far.    Exercises     Shoulder Instructions      Home Living Family/patient expects to be discharged to:: Skilled nursing facility                                  Additional Comments: Pt lives in group home.  Trying to become independent enough to return there.  Supportive family.      Prior Functioning/Environment Level of Independence: Needs assistance  Gait / Transfers Assistance Needed: walked with walker prior to surgery but since surgery has only walked a few times ADL's / Homemaking Assistance Needed: assistance with bathing/dressing and toileting pre surgery due to hip pain and since surgery.            OT Problem List: Decreased strength;Decreased range of motion;Decreased activity tolerance;Impaired balance (sitting and/or standing);Pain;Decreased knowledge of precautions;Decreased knowledge of use of DME or AE      OT Treatment/Interventions: Self-care/ADL training;DME and/or AE instruction;Therapeutic activities;Balance training;Patient/family education;Therapeutic exercise    OT Goals(Current goals can be found in the care plan section) Acute Rehab OT Goals Patient Stated Goal: To be more independent in order to return to Group Home OT Goal Formulation: With patient/family Time For Goal Achievement: 06/03/21 Potential to Achieve Goals: Good ADL Goals Pt Will Perform Grooming: with supervision;standing Pt Will Perform Lower Body Bathing: with supervision;sit to/from stand;with adaptive equipment Pt Will Perform Lower Body Dressing: with supervision;sit to/from stand;with adaptive equipment Pt Will Transfer to Toilet: with min assist;ambulating;regular height toilet;grab bars Pt Will Perform Toileting - Clothing Manipulation and hygiene: with min guard  assist;with adaptive equipment;sit to/from stand  OT Frequency: Min 2X/week   Barriers to D/C: Decreased caregiver support  lives in group home. Will need SNF rehab before d/c back to group home.       Co-evaluation              AM-PAC OT "6 Clicks" Daily Activity     Outcome Measure Help from another person eating meals?: None Help from another person taking care of  personal grooming?: A Little Help from another person toileting, which includes using toliet, bedpan, or urinal?: A Lot Help from another person bathing (including washing, rinsing, drying)?: A Lot Help from another person to put on and taking off regular upper body clothing?: A Little Help from another person to put on and taking off regular lower body clothing?: A Lot 6 Click Score: 16   End of Session Equipment Utilized During Treatment: Rolling walker Nurse Communication: Mobility status  Activity Tolerance: Patient tolerated treatment well Patient left: in chair;with call bell/phone within reach;with family/visitor present  OT Visit Diagnosis: Other abnormalities of gait and mobility (R26.89);Pain Pain - Right/Left: Left Pain - part of body: Hip                Time: 6644-0347 OT Time Calculation (min): 32 min Charges:  OT General Charges $OT Visit: 1 Visit OT Evaluation $OT Eval Moderate Complexity: 1 Mod OT Treatments $Self Care/Home Management : 8-22 mins  Hope Budds 05/20/2021, 10:30 AM

## 2021-05-20 NOTE — Progress Notes (Signed)
    Subjective:  Patient reports pain as mild.  Denies N/V/CP/SOB.  Objective:   VITALS:   Vitals:   05/19/21 2331 05/20/21 0153 05/20/21 0619 05/20/21 1010  BP: 106/63 111/62 114/61 123/63  Pulse: 84 76 72 85  Resp: 18 16 16 18   Temp: 98.2 F (36.8 C)  (!) 97.5 F (36.4 C)   TempSrc: Oral  Oral   SpO2: 100% 97% 100% 98%    NAD ABD soft Neurovascular intact Sensation intact distally Intact pulses distally Dorsiflexion/Plantar flexion intact Incision: Incisional vac and JP drain in place House vac was off.  Power was connected but was found to be nonfunctional. Unit was connected to wall outlet and functioned properly  Lab Results  Component Value Date   WBC 5.2 05/20/2021   WBC 5.0 05/20/2021   HGB 7.4 (L) 05/20/2021   HGB 7.4 (L) 05/20/2021   HCT 23.6 (L) 05/20/2021   HCT 23.3 (L) 05/20/2021   MCV 108.8 (H) 05/20/2021   MCV 108.4 (H) 05/20/2021   PLT 381 05/20/2021   PLT 379 05/20/2021   BMET    Component Value Date/Time   NA 138 05/20/2021 0325   NA 139 07/30/2017 0000   K 4.4 05/20/2021 0325   CL 106 05/20/2021 0325   CO2 25 05/20/2021 0325   GLUCOSE 114 (H) 05/20/2021 0325   BUN 5 (L) 05/20/2021 0325   BUN 18 07/30/2017 0000   CREATININE 0.53 05/20/2021 0325   CREATININE 0.52 05/20/2021 0325   CALCIUM 8.0 (L) 05/20/2021 0325   CALCIUM 7.5 (L) 06/22/2017 0607   GFRNONAA >60 05/20/2021 0325   GFRNONAA >60 05/20/2021 0325   GFRAA >60 06/24/2017 06/26/2017     Assessment/Plan: 1 Day Post-Op   Principal Problem:   Post-operative infection Active Problems:   Rheumatoid arthritis involving multiple sites (HCC)   Gastroesophageal reflux disease without esophagitis   Moderate asthma without complication   Hypothyroidism   WBAT with walker DVT ppx: Lovenox, SCDs, TEDS PO pain control PT/OT Continue to monitor cultures for growth.  Infectious disease needed to direct ABX.  PICC line needed for outpatient ABX therapy. ABLA: Asymptomatic this  AM Dispo: D/C to rehab once PICC line and ABX determined     8413 05/20/2021, 11:22 AM  [facility] is now [facility] [address], [place], [place] Phone: [phone] [email or web address] Facebook  [phone]

## 2021-05-20 NOTE — Anesthesia Postprocedure Evaluation (Signed)
Anesthesia Post Note  Patient: Marissa Gray  Procedure(s) Performed: IRRIGATION AND DEBRIDEMENT LEFT HIP WITH POSSIBLE HEAD AND LINER EXCHANGE (Left )     Patient location during evaluation: PACU Anesthesia Type: General Level of consciousness: awake and alert Pain management: pain level controlled Vital Signs Assessment: post-procedure vital signs reviewed and stable Respiratory status: spontaneous breathing, nonlabored ventilation, respiratory function stable and patient connected to nasal cannula oxygen Cardiovascular status: blood pressure returned to baseline and stable Postop Assessment: no apparent nausea or vomiting Anesthetic complications: no   No complications documented.  Last Vitals:  Vitals:   05/20/21 1409 05/20/21 1808  BP: (!) 102/59 (!) 102/59  Pulse: 80 82  Resp: 16 18  Temp: 36.4 C 36.8 C  SpO2: 97% 100%    Last Pain:  Vitals:   05/20/21 1808  TempSrc: Oral  PainSc:                  Nelle Don Tristian Sickinger

## 2021-05-20 NOTE — Progress Notes (Signed)
PROGRESS NOTE    Marissa Gray  LFY:[id number] DOB: January 26, 1981 DOA: 05/17/2021 PCP: Merri Brunette, MD   Brief Narrative: 40 year old female with a past medical history significant for Down syndrome hypothyroidism obesity asthma GERD rheumatoid arthritis recent left hip total arthroplasty 04/23/2021 admitted with increasing drainage and tenderness at the surgical site.  Patient has been [place] in a facility prior to admission.   Patient is status post IR guided drainage of the area and irrigation and debridement of the left hip 05/20/2021.  Assessment & Plan:   Principal Problem:   Post-operative infection Active Problems:   Rheumatoid arthritis involving multiple sites (HCC)   Gastroesophageal reflux disease without esophagitis   Moderate asthma without complication   Hypothyroidism   #1 left hip surgical site infection status post left total hip arthroplasty 04/23/2021 Dr. Lequita Halt.  Patient admitted with increasing left hip drainage and increasing pain.  Seen by Ortho Patient had IR guided drainage done 5/22 Status post excisional debridement of skin and subcutaneous tissue left hip 05/19/2021 On vanco and merepenem Please consult infectious disease once culture and sensitivity back For choice and duration of antibiotic.  She might need a PICC line. Continue DVT prophylaxis with Lovenox per Ortho.  She was on Xarelto prior to admission. Weightbearing as tolerated with walker.  #2 asthma -stable  #3 gerd on ppi  #4 obesity counciled mom and patient   #5 downs syndrome-  #6 hypoalbuminemia cc/s dietary  #7 ABLA/Chronic anemia-hb 7.4 from 9.5.  We will recheck labs in a.m. if hemoglobin drops less than 7 will transfuse her with packed RBC discussed with mother.  Interventions: Raechel Chute (each supplement provides 350kcal and 20 grams of protein)  Estimated body mass index is 40.08 kg/m as calculated from the following:   Height as of 04/23/21: 4\' 6"  (1.372 m).    Weight as of 04/23/21: 75.4 kg.  DVT prophylaxis: Xarelto Code Status: full Family Communication: Discussed with mother in the room disposition Plan:  Status is: Inpatient  Dispo: The patient is from: Home              Anticipated d/c is to: SNF              Patient currently not medically stable   difficult to place patient no  Consultants: ortho  Procedures Antimicrobials:vancomycin and meropenem   Subjective: She is resting in bed awake and alert she tells me her pain level is 4 out of 10 she denies any new complaints Drain in place  Objective: Vitals:   05/19/21 2331 05/20/21 0153 05/20/21 0619 05/20/21 1010  BP: 106/63 111/62 114/61 123/63  Pulse: 84 76 72 85  Resp: 18 16 16 18   Temp: 98.2 F (36.8 C)  (!) 97.5 F (36.4 C)   TempSrc: Oral  Oral   SpO2: 100% 97% 100% 98%    Intake/Output Summary (Last 24 hours) at 05/20/2021 1404 Last data filed at 05/20/2021 1109 Gross per 24 hour  Intake 5408.83 ml  Output 390 ml  Net 5018.83 ml   There were no vitals filed for this visit.  Examination:  General exam: Appears calm and comfortable  Respiratory system: Clear to auscultation. Respiratory effort normal. Cardiovascular system: S1 & S2 heard, RRR. No JVD, murmurs, rubs, gallops or clicks. No pedal edema. Gastrointestinal system: Abdomen is nondistended, soft and nontender. No organomegaly or masses felt. Normal bowel sounds heard. Central nervous system: Alert and oriented. No focal neurological deficits. Extremities: Left hip incision is covered with dressing  but does have purulent drainage and erythema drain in place with bloody drainage  Skin: left hip erythema Psychiatry: Judgement and insight appear normal. Mood & affect appropriate.     Data Reviewed: I have personally reviewed following labs and imaging studies  CBC: Recent Labs  Lab 05/17/21 2209 05/19/21 0254 05/19/21 2006 05/20/21 0325  WBC 7.2 4.4  --  5.0  5.2  NEUTROABS 5.3  --   --   --    HGB 9.7* 8.7* 9.5* 7.4*  7.4*  HCT 30.5* 27.2* 28.0* 23.3*  23.6*  MCV 108.2* 109.2*  --  108.4*  108.8*  PLT 486* 442*  --  379  381   Basic Metabolic Panel: Recent Labs  Lab 05/18/21 0352 05/19/21 0254 05/19/21 2006 05/20/21 0325  NA 139 140 140 138  K 4.0 4.0 4.4 4.4  CL 109 109  --  106  CO2 24 25  --  25  GLUCOSE 91 99  --  114*  BUN 9 10  --  5*  CREATININE 0.57 0.70  --  0.52  0.53  CALCIUM 8.1* 7.7*  --  8.0*   GFR: CrCl cannot be calculated (Unknown ideal weight.). Liver Function Tests: Recent Labs  Lab 05/18/21 0352 05/19/21 0254  AST 16 20  ALT 18 23  ALKPHOS 203* 187*  BILITOT 0.5 0.3  PROT 6.8 6.3*  ALBUMIN 2.4* 2.3*   No results for input(s): LIPASE, AMYLASE in the last 168 hours. No results for input(s): AMMONIA in the last 168 hours. Coagulation Profile: Recent Labs  Lab 05/18/21 0352  INR 1.2   Cardiac Enzymes: No results for input(s): CKTOTAL, CKMB, CKMBINDEX, TROPONINI in the last 168 hours. BNP (last 3 results) No results for input(s): PROBNP in the last 8760 hours. HbA1C: No results for input(s): HGBA1C in the last 72 hours. CBG: No results for input(s): GLUCAP in the last 168 hours. Lipid Profile: No results for input(s): CHOL, HDL, LDLCALC, TRIG, CHOLHDL, LDLDIRECT in the last 72 hours. Thyroid Function Tests: No results for input(s): TSH, T4TOTAL, FREET4, T3FREE, THYROIDAB in the last 72 hours. Anemia Panel: No results for input(s): VITAMINB12, FOLATE, FERRITIN, TIBC, IRON, RETICCTPCT in the last 72 hours. Sepsis Labs: No results for input(s): PROCALCITON, LATICACIDVEN in the last 168 hours.  Recent Results (from the past 240 hour(s))  SARS CORONAVIRUS 2 (TAT 6-24 HRS) Nasopharyngeal Nasopharyngeal Swab     Status: None   Collection Time: 05/17/21 10:04 PM   Specimen: Nasopharyngeal Swab  Result Value Ref Range Status   SARS Coronavirus 2 NEGATIVE NEGATIVE Final    Comment: (NOTE) SARS-CoV-2 target nucleic acids are NOT  DETECTED.  The SARS-CoV-2 RNA is generally detectable in upper and lower respiratory specimens during the acute phase of infection. Negative results do not preclude SARS-CoV-2 infection, do not rule out co-infections with other pathogens, and should not be used as the sole basis for treatment or other patient management decisions. Negative results must be combined with clinical observations, patient history, and epidemiological information. The expected result is Negative.  Fact Sheet for Patients: HairSlick.no  Fact Sheet for Healthcare Providers: quierodirigir.com  This test is not yet approved or cleared by the [place] FDA and  has been authorized for detection and/or diagnosis of SARS-CoV-2 by FDA under an Emergency Use Authorization (EUA). This EUA will remain  in effect (meaning this test can be used) for the duration of the COVID-19 declaration under Se ction 564(b)(1) of the Act, 21 U.S.C. section 360bbb-3(b)(1), unless  the authorization is terminated or revoked sooner.  Performed at [facility], [address], [place], [place] [address]   Culture, blood (routine x 2)     Status: None (Preliminary result)   Collection Time: 05/18/21  7:55 AM   Specimen: BLOOD  Result Value Ref Range Status   Specimen Description   Final    BLOOD RIGHT ANTECUBITAL Performed at [facility], [address] [place], [place] [address]    Special Requests   Final    BOTTLES DRAWN AEROBIC ONLY Blood Culture adequate volume Performed at [facility], [address], [place], [place] [address]    Culture   Final    NO GROWTH 2 DAYS Performed at [facility], [address], [place], [place] [address]    Report Status PENDING  Incomplete  Culture, blood (routine x 2)     Status: None (Preliminary result)   Collection Time: 05/18/21  7:55 AM   Specimen: BLOOD  Result Value Ref  Range Status   Specimen Description   Final    BLOOD RIGHT ANTECUBITAL Performed at [facility], [address], [place], [place] [address]    Special Requests   Final    BOTTLES DRAWN AEROBIC ONLY Blood Culture adequate volume Performed at [facility], [address] [place], [place] [address]    Culture   Final    NO GROWTH 2 DAYS Performed at [facility], [address], [place], [place] [address]    Report Status PENDING  Incomplete  Body fluid culture w Gram Stain     Status: None (Preliminary result)   Collection Time: 05/18/21 12:00 PM   Specimen: PATH Cytology Misc. fluid; Synovial Fluid  Result Value Ref Range Status   Specimen Description HIP  Final   Special Requests LEFT HIP  Final   Gram Stain   Final    RARE WBC PRESENT, PREDOMINANTLY MONONUCLEAR NO ORGANISMS SEEN    Culture   Final    NO GROWTH 2 DAYS Performed at [facility], [address] [place], [place] [address]    Report Status PENDING  Incomplete  MRSA PCR Screening     Status: Abnormal   Collection Time: 05/18/21 12:30 PM   Specimen: Nasal Mucosa; Nasopharyngeal  Result Value Ref Range Status   MRSA by PCR POSITIVE (A) NEGATIVE Final    Comment:        The GeneXpert MRSA Assay (FDA approved for NASAL specimens only), is one component of a comprehensive MRSA colonization surveillance program. It is not intended to diagnose MRSA infection nor to guide or monitor treatment for MRSA infections. RESULT CALLED TO, READ BACK BY AND VERIFIED WITH: MILLER,J. RN AT 1436 05/18/21 MULLINS,T Performed at [facility], [address], [place], [place] [address]   Aerobic/Anaerobic Culture w Gram Stain (surgical/deep wound)     Status: None (Preliminary result)   Collection Time: 05/19/21  6:15 PM   Specimen: Synovial, Left Hip; Body Fluid  Result Value Ref Range Status   Specimen Description   Final    SYNOVIAL LEFT HIP Performed  at [facility], [address], [place], [place] [address]    Special Requests   Final    NONE Performed at [facility], [address], [place], [place] [address]    Gram Stain   Final    RARE WBC PRESENT, PREDOMINANTLY PMN FEW  GRAM POSITIVE COCCI IN PAIRS IN CLUSTERS    Culture   Final    TOO YOUNG TO READ Performed at [facility], [address] [place], [place] [address]    Report Status PENDING  Incomplete         Radiology Studies: No results found.      Scheduled Meds: . docusate sodium  100 mg Oral BID  . enoxaparin (LOVENOX) injection  40 mg Subcutaneous Q24H  . folic acid  1 mg Oral Daily  . nutrition supplement (JUVEN)  1 packet Oral BID BM  . pantoprazole  40 mg Oral Daily  . senna  1 tablet Oral BID  . sodium chloride  1 spray Nasal BID   Continuous Infusions: . sodium chloride 100 mL/hr at 05/20/21 0851  . meropenem (MERREM) IV 1 g (05/20/21 1336)  . vancomycin 0 mg (05/18/21 1803)     LOS: 3 days   Alwyn Ren, MD 05/20/2021, 2:04 PM

## 2021-05-20 NOTE — Evaluation (Signed)
Physical Therapy Evaluation Patient Details Name: Marissa Gray MRN: [id number] DOB: Dec 22, 1981 Today's Date: 05/20/2021   History of Present Illness  Patient is a 40 year old woman with Down's Syndrome s/p recent  left posterior THA per Dr Lequita Halt. admitted with surgical site infection now s/p Excisional debridement of skin and subcutaneous tissue left hip per Dr Linna Caprice on 05/19/21  Clinical Impression  Pt admitted with above diagnosis. Pt motivated with good recall of prior sessions with PT on previous admission.  Requires cues to avoid  hip flexion beyond 90 degrees. amb 16' with RW and min assist  today  Continue to follow in acute setting. Pt currently with functional limitations due to the deficits listed below (see PT Problem List). Pt will benefit from skilled PT to increase their independence and safety with mobility to allow discharge to the venue listed below.       Follow Up Recommendations SNF    Equipment Recommendations  None recommended by PT    Recommendations for Other Services       Precautions / Restrictions Precautions Precautions: Posterior Hip Precaution Booklet Issued: Yes (comment) Precaution Comments: Pt recalls hip precautions but required explanation of not breaking 90 degrees hip flexion. Restrictions Weight Bearing Restrictions: Yes LLE Weight Bearing: Weight bearing as tolerated Other Position/Activity Restrictions: pt with JP drain and VAC L hip      Mobility  Bed Mobility Overal bed mobility: Needs Assistance Bed Mobility: Sit to Supine     Supine to sit: Mod assist;HOB elevated Sit to supine: Min assist;Mod assist   General bed mobility comments: cues for technique and THP, incr time, assist with LEs    Transfers Overall transfer level: Needs assistance Equipment used: Rolling walker (2 wheeled) Transfers: Sit to/from [employer] Sit to Stand: Min assist Stand pivot transfers: Mod assist       General transfer  comment: cues for hand placement and LLE position; transferred from recliner, bed and  BSC  Ambulation/Gait Ambulation/Gait assistance: Min guard;Min assist Gait Distance (Feet): 80 Feet Assistive device: Rolling walker (2 wheeled) Gait Pattern/deviations: Step-to pattern;Decreased step length - right;Decreased step length - left;Trunk flexed Gait velocity: decreased   General Gait Details: cues for sequence and RW position, for THP--avoiding IR L hip with turns  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                             Pertinent Vitals/Pain Pain Assessment: Faces Faces Pain Scale: Hurts little more Pain Location: L hip Pain Descriptors / Indicators: Tightness Pain Intervention(s): Limited activity within patient's tolerance;Monitored during session;Premedicated before session;Repositioned;Ice applied    Home Living Family/patient expects to be discharged to:: Skilled nursing facility                 Additional Comments: Pt lives in group home.  Trying to become independent enough to return there.  Supportive family.    Prior Function Level of Independence: Needs assistance   Gait / Transfers Assistance Needed: walked with walker prior to surgery but since surgery has only walked  a few times  ADL's / Homemaking Assistance Needed: assistance with bathing/dressing and toileting pre surgery due to hip pain and since surgery.        Hand Dominance   Dominant Hand: Right    Extremity/Trunk Assessment   Upper Extremity Assessment Upper Extremity  Assessment: LUE deficits/detail LUE Deficits / Details: Shoulder flexion and abduction to 90 due to rheumatoid arthritis. LUE: Unable to fully assess due to pain LUE Sensation: WNL LUE Coordination: decreased gross motor    Lower Extremity Assessment Lower Extremity Assessment: Defer to PT evaluation    Cervical / Trunk Assessment Cervical / Trunk Assessment: Normal  Communication   Communication: No difficulties  Cognition Arousal/Alertness: Awake/alert Behavior During Therapy: WFL for tasks assessed/performed Overall Cognitive Status: Within Functional Limits for tasks assessed                                 General Comments: Pt with good memory and recall of previous surgeries and how to do things the way therapists have instructed.  Very motivated.      General Comments General comments (skin integrity, edema, etc.): Pt very motivated and works hard.  Does well directing her care recalling what she has learned in therapy so far.    Exercises Total Joint Exercises Ankle Circles/Pumps: AROM;Both;10 reps Long Arc Quad: AROM;Both;5 reps   Assessment/Plan    PT Assessment    PT Problem List         PT Treatment Interventions      PT Goals (Current goals can be found in the Care Plan section)  Acute Rehab PT Goals Patient Stated Goal: To be more independent in order to return to Group Home PT Goal Formulation: With patient Time For Goal Achievement: 05/01/21 Potential to Achieve Goals: Good    Frequency 7X/week   Barriers to discharge        Co-evaluation               AM-PAC PT "6 Clicks" Mobility  Outcome Measure Help needed turning from your back to your side while in a flat bed without using bedrails?: A Lot Help needed moving from lying on your back to sitting on the side of a flat bed without using bedrails?: A Lot Help needed moving to and from a bed to a chair (including a wheelchair)?: A Little Help needed standing up from a  chair using your arms (e.g., wheelchair or bedside chair)?: A Little Help needed to walk in hospital room?: A Little Help needed climbing 3-5 steps with a railing? : A Lot 6 Click Score: 15    End of Session Equipment Utilized During Treatment: Gait belt Activity Tolerance: Patient tolerated treatment well Patient left: in bed;with call bell/phone within reach;with bed alarm set;with family/visitor present   PT Visit Diagnosis: Difficulty in walking, not elsewhere classified (R26.2)    Time: 3846-6599 PT Time Calculation (min) (ACUTE ONLY): 43 min   Charges:   PT Evaluation $PT Eval Low Complexity: 1 Low PT Treatments $Gait Training: 8-22 mins $Therapeutic Activity: 8-22 mins        Delice Bison, PT  Acute Rehab Dept ([facility]) [phone] Pager [phone]  05/20/2021   [facility] 05/20/2021, 1:40 PM

## 2021-05-20 NOTE — Progress Notes (Signed)
Subjective: 1 Day Post-Op Procedure(s) (LRB): IRRIGATION AND DEBRIDEMENT LEFT HIP WITH POSSIBLE HEAD AND LINER EXCHANGE (Left) Patient reports pain as mild.   Patient seen in rounds for Dr. Lequita Halt. Patient is well, and has had no acute complaints or problems. No acute overnight events. Patient resting comfortably in bed. Denies SOB, chest pain, or calf pain.  We will continue therapy today.   Objective: Vital signs in last 24 hours: Temp:  [97 F (36.1 C)-98.2 F (36.8 C)] 97.5 F (36.4 C) (05/24 0619) Pulse Rate:  [72-105] 85 (05/24 1010) Resp:  [14-21] 18 (05/24 1010) BP: (90-130)/(57-88) 123/63 (05/24 1010) SpO2:  [93 %-100 %] 98 % (05/24 1010)  Intake/Output from previous day:  Intake/Output Summary (Last 24 hours) at 05/20/2021 1127 Last data filed at 05/20/2021 1109 Gross per 24 hour  Intake 5408.83 ml  Output 390 ml  Net 5018.83 ml     Intake/Output this shift: Total I/O In: 410 [P.O.:410] Out: 0   Labs: Recent Labs    05/17/21 2209 05/19/21 0254 05/19/21 2006 05/20/21 0325  HGB 9.7* 8.7* 9.5* 7.4*  7.4*   Recent Labs    05/19/21 0254 05/19/21 2006 05/20/21 0325  WBC 4.4  --  5.0  5.2  RBC 2.49*  --  2.15*  2.17*  HCT 27.2* 28.0* 23.3*  23.6*  PLT 442*  --  379  381   Recent Labs    05/19/21 0254 05/19/21 2006 05/20/21 0325  NA 140 140 138  K 4.0 4.4 4.4  CL 109  --  106  CO2 25  --  25  BUN 10  --  5*  CREATININE 0.70  --  0.52  0.53  GLUCOSE 99  --  114*  CALCIUM 7.7*  --  8.0*   Recent Labs    05/18/21 0352  INR 1.2    Exam: General - Patient is Alert and Oriented Extremity - Neurologically intact Neurovascular intact Intact pulses distally Dorsiflexion/Plantar flexion intact Dressing - dressing C/D/I and drain is intact, with minimal regular appearing blood present.  Motor Function - intact, moving foot and toes well on exam.   Past Medical History:  Diagnosis Date  . Asthma   . 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 Closed bicondylar fracture of right tibial plateau 06/20/2017  . 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 Dermatitis    left leg   . Down's syndrome   . GERD (gastroesophageal reflux disease)   . Hashimoto's disease   . Heart murmur   . Hypothyroidism   . Pneumonia    hx of years ago   . Rheumatoid arthritis (HCC)   . Strabismus     Assessment/Plan: 1 Day Post-Op Procedure(s) (LRB): IRRIGATION AND DEBRIDEMENT LEFT HIP WITH POSSIBLE HEAD AND LINER EXCHANGE (Left) Principal Problem:   Post-operative infection Active Problems:   Rheumatoid arthritis involving multiple sites (HCC)   Gastroesophageal reflux disease without esophagitis   Moderate asthma without complication   Hypothyroidism  Estimated body mass index is 40.08 kg/m as calculated from the following:   Height as of 04/23/21: 4\' 6"  (1.372 m).   Weight as of 04/23/21: 75.4 kg. Up with therapy  DVT Prophylaxis - Xarelto Weight bearing as tolerated D/C knee immobilizer Hemovac pulled without difficulty Begin therapy Hip precautions discussed with patient  Plan is to go Skilled nursing facility after hospital stay. Still awaiting cultures at this time -- Will potentially consult ID regarding antibiotic regiment.  Plan for wound  vac and drain to be removed at post op visit in seven days, in clinic.   Patient to be seen by physical therapy twice today.   Nelia Shi, MBA, PA-C Orthopedic Surgery 05/20/2021, 11:27 AM

## 2021-05-20 NOTE — TOC Initial Note (Signed)
Transition of Care [facility]) - Initial/Assessment Note   Patient Details  Name: Marissa Gray MRN: [id number] Date of Birth: 02-21-1981  Transition of Care [facility]) CM/SW Contact:    Ewing Schlein, LCSW Phone Number: 05/20/2021, 2:22 PM  Clinical Narrative: Patient is a 40 year old female who was admitted for a post-operative infection. PT and OT evaluations recommended SNF. CSW spoke with patient's mother, [name], to discuss recommendations. Mother wants patient to return to [facility] in [place].  CSW called Clydie Braun in admissions at [facility] who confirmed patient can return and will need an updated FL2 and discharge summary. Patient's 30 day PASRR does not expire until 05/25/21, so a new PASRR cannot be requested at this time. TOC to follow.               Expected Discharge Plan: Skilled Nursing Facility Barriers to Discharge: Continued Medical Work up  Patient Goals and CMS Choice Patient states their goals for this hospitalization and ongoing recovery are:: Discharge to [facility] in [email or web address] Compare Post Acute Care list provided to:: Patient Represenative (must comment) Choice offered to / list presented to : Patient,Parent  Expected Discharge Plan and Services Expected Discharge Plan: Skilled Nursing Facility In-house Referral: Clinical Social Work Post Acute Care Choice: Skilled Nursing Facility Living arrangements for the past 2 months: Group Home             DME Arranged: N/A DME Agency: NA  Prior Living Arrangements/Services Living arrangements for the past 2 months: Group Home Lives with:: Facility Resident Patient language and need for interpreter reviewed:: Yes Do you feel safe going back to the place where you live?: Yes      Need for Family Participation in Patient Care: Yes (Comment) Care giver support system in place?: Yes (comment) Criminal Activity/Legal Involvement Pertinent to Current Situation/Hospitalization: No - Comment  as needed  Activities of Daily Living Home Assistive Devices/Equipment: Eyeglasses,Bedside commode/3-in-1,Walker (specify type) ADL Screening (condition at time of admission) Patient's cognitive ability adequate to safely complete daily activities?: Yes Is the patient deaf or have difficulty hearing?: No Does the patient have difficulty seeing, even when wearing glasses/contacts?: No Does the patient have difficulty concentrating, remembering, or making decisions?: No Patient able to express need for assistance with ADLs?: Yes Does the patient have difficulty dressing or bathing?: Yes Independently performs ADLs?: Yes (appropriate for developmental age) Does the patient have difficulty walking or climbing stairs?: Yes Weakness of Legs: Left Weakness of Arms/Hands: None  Permission Sought/Granted Permission sought to share information with : Oceanographer granted to share information with : Yes, Verbal Permission Granted Share Information with NAME: Clydie Braun (admissions) Permission granted to share info w AGENCY: [facility]  Emotional Assessment Appearance:: Appears stated age Orientation: : Oriented to Self,Oriented to Place,Oriented to  Time,Oriented to Situation Alcohol / Substance Use: Not Applicable Psych Involvement: No (comment)  Admission diagnosis:  Post-operative infection [T81.40XA] Status post total hip replacement, 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 Post-operative infection 05/17/2021  . OA (osteoarthritis) of hip 04/23/2021  . Primary osteoarthritis of left hip 04/23/2021  . Hashimoto's thyroiditis 09/12/2017  . Hypothyroidism 08/03/2017  . Nodular thyroid disease 08/03/2017  . Chronic pain of left knee 07/04/2017  . Moderate asthma without complication 06/28/2017  . Hypokalemia 06/28/2017  . Constipation 06/28/2017  . 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  Gastroesophageal reflux disease without esophagitis   . 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 Down's syndrome   . Rheumatoid arthritis involving multiple sites (HCC)   . Closed bicondylar fracture of right tibial plateau 06/20/2017   PCP:  Merri Brunette, MD Pharmacy:   [facility] [address] [place] [address] Phone: [phone] Fax: [phone]  Readmission Risk Interventions No flowsheet data found.

## 2021-05-21 DIAGNOSIS — Q909 Down syndrome, unspecified: Secondary | ICD-10-CM

## 2021-05-21 DIAGNOSIS — T8141XA Infection following a procedure, superficial incisional surgical site, initial encounter: Secondary | ICD-10-CM | POA: Diagnosis not present

## 2021-05-21 DIAGNOSIS — R627 Adult failure to thrive: Secondary | ICD-10-CM

## 2021-05-21 DIAGNOSIS — J4540 Moderate persistent asthma, uncomplicated: Secondary | ICD-10-CM

## 2021-05-21 LAB — CBC
HCT: 22 % — ABNORMAL LOW (ref 36.0–46.0)
Hemoglobin: 7 g/dL — ABNORMAL LOW (ref 12.0–15.0)
MCH: 34.3 pg — ABNORMAL HIGH (ref 26.0–34.0)
MCHC: 31.8 g/dL (ref 30.0–36.0)
MCV: 107.8 fL — ABNORMAL HIGH (ref 80.0–100.0)
Platelets: 399 10*3/uL (ref 150–400)
RBC: 2.04 MIL/uL — ABNORMAL LOW (ref 3.87–5.11)
RDW: 13.9 % (ref 11.5–15.5)
WBC: 5.6 10*3/uL (ref 4.0–10.5)
nRBC: 1.4 % — ABNORMAL HIGH (ref 0.0–0.2)

## 2021-05-21 LAB — CREATININE, SERUM
Creatinine, Ser: 0.76 mg/dL (ref 0.44–1.00)
GFR, Estimated: >60 mL/min (ref >60)

## 2021-05-21 LAB — OCCULT BLOOD X 1 CARD TO LAB, STOOL: Fecal Occult Bld: NEGATIVE

## 2021-05-21 MED ORDER — MUPIROCIN 2 % EX OINT
1.0000 "application " | TOPICAL_OINTMENT | Freq: Two times a day (BID) | CUTANEOUS | Status: DC
  Administered 2021-05-21 – 2021-05-22 (×3): 1 via NASAL
  Filled 2021-05-21: qty 22

## 2021-05-21 MED ORDER — CHLORHEXIDINE GLUCONATE CLOTH 2 % EX PADS
6.0000 | MEDICATED_PAD | Freq: Every day | CUTANEOUS | Status: DC
  Administered 2021-05-22 – 2021-05-23 (×2): 6 via TOPICAL

## 2021-05-21 NOTE — Progress Notes (Signed)
Pharmacy Antibiotic Note  Marissa Gray is a 40 y.o. female admitted on 05/17/2021 with post-op infection.  Pharmacy has been consulted for vancomycin and meropenem dosing.  Pt is 46 yoF with PMH significant for Down's syndrome, RA. Recently underwent left total hip arthroplasty on 04/23/21. Pt having increased tenderness/drainage from surgical site, presented to [facility] ED and subsequently transferred to [facility].   Pharmacy consulted to delay initiation of antibiotics until after IR guided surgical wound drainage completed on 5/22. Of note, patient did receive a dose of vancomycin on 5/21 PM prior to transfer to [facility].  Today, 05/21/21  WBC WNL  SCr WNL though increased somewhat  CRP 22.5, Sed rate 137 - both elevated on admission  Fluoroscopic guided aspiration of left hip completed 5/22  Plan:  Meropenem 1 g IV q8h  ID RPh recommends d/c  Vancomycin 750 mg IV q24  Goal vancomycin AUC 400-550.   Monitor renal function, culture data. Check vancomycin levels at steady state as needed  Daily SCr for now with plan to check levels soon depending on culture results and plans for antibiotics     Temp (24hrs), Avg:98.1 F (36.7 C), Min:97.6 F (36.4 C), Max:98.6 F (37 C)  Recent Labs  Lab 05/17/21 2209 05/18/21 0352 05/19/21 0254 05/20/21 0325 05/21/21 0317  WBC 7.2  --  4.4 5.0  5.2 5.6  CREATININE  --  0.57 0.70 0.52  0.53 0.76    CrCl cannot be calculated (Unknown ideal weight.).    Allergies  Allergen Reactions  . Cefaclor Hives and Rash    Other reaction(s): Unknown  . Penicillins Hives     Has patient had a PCN reaction causing immediate rash, facial/tongue/throat swelling, SOB or lightheadedness with hypotension: Unknown Has patient had a PCN reaction causing severe rash involving mucus membranes or skin necrosis: Unknown Has patient had a PCN reaction that required hospitalization: Unknown Has patient had a PCN reaction occurring within the last 10  years: Unknown If all of the above answers are "NO", then may proceed with Cephalosporin use. Other reaction(s): rash, Unknown  . Sulfa Antibiotics Hives and Other (See Comments)    FEVER Other reaction(s): Unknown    Antimicrobials this admission: vancomycin 5/21 >>  meropenem 5/22 >> 5/23?  Dose adjustments this admission:  Microbiology results: 5/22 BCx: ngtd 5/22 Synovial fluid: few Staph aureus  5/22 MRSA PCR: Positive  Thank you for allowing pharmacy to be a part of this patient's care.  Valentina Gu 05/21/2021 12:59 PM

## 2021-05-21 NOTE — Consult Note (Signed)
[facility]       Reason for Consult: surgical site infection    Referring Physician: Dr. Roda Shutters  Principal Problem:   Post-operative infection Active Problems:   Rheumatoid arthritis involving multiple sites (HCC)   Gastroesophageal reflux disease without esophagitis   Moderate asthma without complication   Hypothyroidism   . docusate sodium  100 mg Oral BID  . enoxaparin (LOVENOX) injection  40 mg Subcutaneous Q24H  . folic acid  1 mg Oral Daily  . nutrition supplement (JUVEN)  1 packet Oral BID BM  . pantoprazole  40 mg Oral Daily  . senna  1 tablet Oral BID  . sodium chloride  1 spray Nasal BID    Recommendations:  continue vancomycin Will stop meropenem Will determine appropriate oral therapy once sensitivities known tomorrow Plan for 2-3 weeks  Assessment: She has a surgical wound infection with non-viable skin and subcutaneous tissue now s/p excisional debridment down to healthy tissue.  Bacteria with Staph aureus with sensitivities pending.  No joint involvement/concerns noted.    Antibiotics: Vancomycin and meropenem day 4  HPI: Marissa Gray is a 40 y.o. female with a history of Down's syndrome, rheumatoid arthritis with a left total hip arthroplasty done by Dr. Despina Hick on 04/23/21.  On 5/22 she developed acute, new drainage with redness around the wound and increased pain and went to an outside ED and transferred to [facility].  She was taken to the OR on 05/19/21 by Dr. Linna Caprice and noted seroppurulent drainage from the distal third of the incision.  A pocket of seropurulent fluid was found under the subcutaneous tissue and sent for culture, which is now growing Staph aureus.  All non-viable tissue was removed and drain placed.  No issues in the joint.     Review of Systems:  Constitutional: negative for fevers and chills Gastrointestinal: negative for diarrhea Integument/breast: negative for rash All other systems reviewed and are negative     Past Medical History:  Diagnosis Date  . Asthma   . 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 Closed bicondylar fracture of right tibial plateau 06/20/2017  . 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 Dermatitis    left leg   . Down's syndrome   . GERD (gastroesophageal reflux disease)   . Hashimoto's disease   . Heart murmur   . Hypothyroidism   . Pneumonia    hx of years ago   . Rheumatoid arthritis (HCC)   . Strabismus     Social History   Tobacco Use  . Smoking status: Never Smoker  . Smokeless tobacco: Never Used  Vaping Use  . Vaping Use: Never used  Substance Use Topics  . Alcohol use: No  . Drug use: No    Family History  Problem Relation Age of Onset  . Heart disease Neg Hx     Allergies  Allergen Reactions  . Cefaclor Hives and Rash    Other reaction(s): Unknown  . Penicillins Hives     Has patient had a PCN reaction causing immediate rash, facial/tongue/throat swelling, SOB or lightheadedness with hypotension: Unknown Has patient had a PCN reaction causing severe rash involving mucus membranes or skin necrosis: Unknown Has patient had a PCN reaction that required hospitalization: Unknown Has patient had a PCN reaction occurring within the last 10 years: Unknown If all of the above answers are "NO", then may proceed with Cephalosporin use. Other reaction(s): rash, Unknown  . Sulfa Antibiotics Hives  and Other (See Comments)    FEVER Other reaction(s): Unknown    Physical Exam: Constitutional: in no apparent distress  Vitals:   05/21/21 0508 05/21/21 1339  BP: 110/71 (!) 105/52  Pulse: 90 75  Resp: 16 16  Temp: 98 F (36.7 C) (!) 97.5 F (36.4 C)  SpO2: 99% 98%   EYES: anicteric Cardiovascular: Cor RRR Respiratory: clear; Musculoskeletal: no pedal edema noted; bandaged, VAC in place Skin: negatives: no rash Neuro: non-focal  Lab Results  Component Value Date   WBC 5.6 05/21/2021   HGB 7.0 (L) 05/21/2021   HCT  22.0 (L) 05/21/2021   MCV 107.8 (H) 05/21/2021   PLT 399 05/21/2021    Lab Results  Component Value Date   CREATININE 0.76 05/21/2021   BUN 5 (L) 05/20/2021   NA 138 05/20/2021   K 4.4 05/20/2021   CL 106 05/20/2021   CO2 25 05/20/2021    Lab Results  Component Value Date   ALT 23 05/19/2021   AST 20 05/19/2021   ALKPHOS 187 (H) 05/19/2021     Microbiology: Recent Results (from the past 240 hour(s))  SARS CORONAVIRUS 2 (TAT 6-24 HRS) Nasopharyngeal Nasopharyngeal Swab     Status: None   Collection Time: 05/17/21 10:04 PM   Specimen: Nasopharyngeal Swab  Result Value Ref Range Status   SARS Coronavirus 2 NEGATIVE NEGATIVE Final    Comment: (NOTE) SARS-CoV-2 target nucleic acids are NOT DETECTED.  The SARS-CoV-2 RNA is generally detectable in upper and lower respiratory specimens during the acute phase of infection. Negative results do not preclude SARS-CoV-2 infection, do not rule out co-infections with other pathogens, and should not be used as the sole basis for treatment or other patient management decisions. Negative results must be combined with clinical observations, patient history, and epidemiological information. The expected result is Negative.  Fact Sheet for Patients: HairSlick.no  Fact Sheet for Healthcare Providers: quierodirigir.com  This test is not yet approved or cleared by the [place] FDA and  has been authorized for detection and/or diagnosis of SARS-CoV-2 by FDA under an Emergency Use Authorization (EUA). This EUA will remain  in effect (meaning this test can be used) for the duration of the COVID-19 declaration under Se ction 564(b)(1) of the Act, 21 U.S.C. section 360bbb-3(b)(1), unless the authorization is terminated or revoked sooner.  Performed at [facility], [address] [place], [place] [address]   Culture, blood (routine x 2)     Status: None (Preliminary result)    Collection Time: 05/18/21  7:55 AM   Specimen: BLOOD  Result Value Ref Range Status   Specimen Description   Final    BLOOD RIGHT ANTECUBITAL Performed at [facility], [address], [place], [place] [address]    Special Requests   Final    BOTTLES DRAWN AEROBIC ONLY Blood Culture adequate volume Performed at [facility], [address], [place], [place] [address]    Culture   Final    NO GROWTH 3 DAYS Performed at [facility], [address], [place], [place] [address]    Report Status PENDING  Incomplete  Culture, blood (routine x 2)     Status: None (Preliminary result)   Collection Time: 05/18/21  7:55 AM   Specimen: BLOOD  Result Value Ref Range Status   Specimen Description   Final    BLOOD RIGHT ANTECUBITAL Performed at [facility], [address] [place], [place] [address]  Special Requests   Final    BOTTLES DRAWN AEROBIC ONLY Blood Culture adequate volume Performed at [facility], [address], [place], [place] [address]    Culture   Final    NO GROWTH 3 DAYS Performed at [facility], [address] [place], [place] [address]    Report Status PENDING  Incomplete  Body fluid culture w Gram Stain     Status: None (Preliminary result)   Collection Time: 05/18/21 12:00 PM   Specimen: PATH Cytology Misc. fluid; Synovial Fluid  Result Value Ref Range Status   Specimen Description HIP  Final   Special Requests LEFT HIP  Final   Gram Stain   Final    RARE WBC PRESENT, PREDOMINANTLY MONONUCLEAR NO ORGANISMS SEEN    Culture   Final    NO GROWTH 3 DAYS Performed at [facility], [address] [place], [place] [address]    Report Status PENDING  Incomplete  MRSA PCR Screening     Status: Abnormal   Collection Time: 05/18/21 12:30 PM   Specimen: Nasal Mucosa; Nasopharyngeal  Result Value Ref Range Status   MRSA by PCR POSITIVE (A) NEGATIVE Final    Comment:         The GeneXpert MRSA Assay (FDA approved for NASAL specimens only), is one component of a comprehensive MRSA colonization surveillance program. It is not intended to diagnose MRSA infection nor to guide or monitor treatment for MRSA infections. RESULT CALLED TO, READ BACK BY AND VERIFIED WITH: MILLER,J. RN AT 1436 05/18/21 MULLINS,T Performed at [facility], [address] [place], [place] [address]   Aerobic/Anaerobic Culture w Gram Stain (surgical/deep wound)     Status: None (Preliminary result)   Collection Time: 05/19/21  6:15 PM   Specimen: Synovial, Left Hip; Body Fluid  Result Value Ref Range Status   Specimen Description   Final    SYNOVIAL LEFT HIP Performed at [facility], [address], [place], [place] [address]    Special Requests   Final    NONE Performed at [facility], [address] [place], [place] [address]    Gram Stain   Final    RARE WBC PRESENT, PREDOMINANTLY PMN FEW GRAM POSITIVE COCCI IN PAIRS IN CLUSTERS Performed at [facility], [address], [place], [place] [address]    Culture   Final    FEW STAPHYLOCOCCUS AUREUS SUSCEPTIBILITIES TO FOLLOW NO ANAEROBES ISOLATED; CULTURE IN PROGRESS FOR 5 DAYS    Report Status PENDING  Incomplete    Gardiner Barefoot, MD [facility] [email or web address] 05/21/2021, 3:43 PM

## 2021-05-21 NOTE — Progress Notes (Addendum)
Subjective: 2 Days Post-Op Procedure(s) (LRB): IRRIGATION AND DEBRIDEMENT LEFT HIP WITH POSSIBLE HEAD AND LINER EXCHANGE (Left) Patient reports pain as mild.   Patient seen in rounds for Dr. Lequita Halt. Patient is well, and has had no acute complaints or problems. Denies SOB, chest pain, or calf pain. No acute overnight events. Did well with PT yesterday.  We will continue therapy today.   Objective: Vital signs in last 24 hours: Temp:  [97.6 F (36.4 C)-98.6 F (37 C)] 98 F (36.7 C) (05/25 0508) Pulse Rate:  [80-90] 90 (05/25 0508) Resp:  [16-18] 16 (05/25 0508) BP: (102-123)/(59-71) 110/71 (05/25 0508) SpO2:  [97 %-100 %] 99 % (05/25 0508)  Intake/Output from previous day:  Intake/Output Summary (Last 24 hours) at 05/21/2021 0731 Last data filed at 05/20/2021 1600 Gross per 24 hour  Intake 1863.61 ml  Output 35 ml  Net 1828.61 ml     Intake/Output this shift: No intake/output data recorded.  Labs: Recent Labs    05/19/21 0254 05/19/21 2006 05/20/21 0325 05/21/21 0317  HGB 8.7* 9.5* 7.4*  7.4* 7.0*   Recent Labs    05/20/21 0325 05/21/21 0317  WBC 5.0  5.2 5.6  RBC 2.15*  2.17* 2.04*  HCT 23.3*  23.6* 22.0*  PLT 379  381 399   Recent Labs    05/19/21 0254 05/19/21 2006 05/20/21 0325 05/21/21 0317  NA 140 140 138  --   K 4.0 4.4 4.4  --   CL 109  --  106  --   CO2 25  --  25  --   BUN 10  --  5*  --   CREATININE 0.70  --  0.52  0.53 0.76  GLUCOSE 99  --  114*  --   CALCIUM 7.7*  --  8.0*  --    No results for input(s): LABPT, INR in the last 72 hours.  Exam: General - Patient is Alert and Oriented Extremity - Neurologically intact Neurovascular intact Sensation intact distally Dorsiflexion/Plantar flexion intact Dressing - dressing C/D/I Motor Function - intact, moving foot and toes well on exam.   Past Medical History:  Diagnosis Date  . Asthma   . 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 Closed bicondylar fracture of right tibial  plateau 06/20/2017  . 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 Dermatitis    left leg   . Down's syndrome   . GERD (gastroesophageal reflux disease)   . Hashimoto's disease   . Heart murmur   . Hypothyroidism   . Pneumonia    hx of years ago   . Rheumatoid arthritis (HCC)   . Strabismus     Assessment/Plan: 2 Days Post-Op Procedure(s) (LRB): IRRIGATION AND DEBRIDEMENT LEFT HIP WITH POSSIBLE HEAD AND LINER EXCHANGE (Left) Principal Problem:   Post-operative infection Active Problems:   Rheumatoid arthritis involving multiple sites (HCC)   Gastroesophageal reflux disease without esophagitis   Moderate asthma without complication   Hypothyroidism  Estimated body mass index is 40.08 kg/m as calculated from the following:   Height as of 04/23/21: 4\' 6"  (1.372 m).   Weight as of 04/23/21: 75.4 kg. Up with therapy  DVT Prophylaxis - Lovenox Weight bearing as tolerated Hip precautions discussed with patient  Plan is to go Skilled nursing facility after hospital stay.  Still monitoring cultures at this time. Will consult ID to determine antibiotic regiment.  Plan for wound vac and drain to be removed at post op visit in six days,  in clinic.   Hgb being monitored by hospitalist team - has dropped from 9.5, to 7.4 yesterday, and then 7.0 this morning. Hospitalist plan to infuse patient if Hgb drops below 7.0. Will continue to monitor.   Patient to be seen by physical therapy twice today.    Nelia Shi, MBA, PA-C Orthopedic Surgery 05/21/2021, 7:31 AM

## 2021-05-21 NOTE — Progress Notes (Signed)
Physical Therapy Treatment Patient Details Name: Marissa Gray MRN: [id number] DOB: Jan 18, 1981 Today's Date: 05/21/2021    History of Present Illness Patient is a 40 year old woman with Down's Syndrome s/p recent  left posterior THA per Dr Lequita Halt. admitted with surgical site infection now s/p Excisional debridement of skin and subcutaneous tissue left hip per Dr Linna Caprice on 05/19/21.    PT Comments    Marissa Gray is progressing with mobility. Recalls THP however requires occasional cues for THP during functional mobility tasks. Continue PT POC  Follow Up Recommendations  SNF     Equipment Recommendations  None recommended by PT    Recommendations for Other Services       Precautions / Restrictions Precautions Precautions: Posterior Hip Precaution Booklet Issued: Yes (comment) Precaution Comments: Pt recalls hip precautions but required cues to avoid breaking  90 degrees hip flexion in bed and in sitting EOB Restrictions Weight Bearing Restrictions: No Other Position/Activity Restrictions: pt with JP drain and VAC L hip    Mobility  Bed Mobility Overal bed mobility: Needs Assistance Bed Mobility: Supine to Sit;Sit to Supine     Supine to sit: Min assist Sit to supine: Min assist   General bed mobility comments: cues for technique and THP, incr time, assist with LEs    Transfers Overall transfer level: Needs assistance Equipment used: Rolling walker (2 wheeled) Transfers: Sit to/from Stand Sit to Stand: Min assist Stand pivot transfers: Min assist       General transfer comment: cues for hand placement and LLE position/THP  Ambulation/Gait Ambulation/Gait assistance: Min guard;Min assist Gait Distance (Feet): 65 Feet Assistive device: Rolling walker (2 wheeled) Gait Pattern/deviations: Step-to pattern;Decreased step length - right;Decreased step length - left;Trunk flexed Gait velocity: decreased   General Gait Details: cues for sequence and RW position, for  THP--avoiding IR L hip with turns   Social research officer, government Rankin (Stroke Patients Only)       Balance Overall balance assessment: Needs assistance Sitting-balance support: No upper extremity supported;Feet supported Sitting balance-Leahy Scale: Good     Standing balance support: No upper extremity supported Standing balance-Leahy Scale: Fair Standing balance comment: able to static stand without UE support                            Cognition Arousal/Alertness: Awake/alert Behavior During Therapy: WFL for tasks assessed/performed Overall Cognitive Status: Within Functional Limits for tasks assessed                                 General Comments: Pt with good memory and recall of previous surgeries and how to do things the way therapists have instructed.  Very motivated.      Exercises Total Joint Exercises Ankle Circles/Pumps: AROM;Both;10 reps Long Arc Quad: AROM;Left;10 reps    General Comments        Pertinent Vitals/Pain Pain Assessment: Faces Faces Pain Scale: Hurts a little bit Pain Location: L hip Pain Descriptors / Indicators: Tightness;Sore Pain Intervention(s): Limited activity within patient's tolerance;Monitored during session;Premedicated before session;Repositioned    Home Living                      Prior Function            PT Goals (current goals can  now be found in the care plan section) Acute Rehab PT Goals Patient Stated Goal: To be more independent in order to return to Group Home PT Goal Formulation: With patient Time For Goal Achievement: 05/01/21 Potential to Achieve Goals: Good Progress towards PT goals: Progressing toward goals    Frequency    7X/week      PT Plan Current plan remains appropriate    Co-evaluation              AM-PAC PT "6 Clicks" Mobility   Outcome Measure  Help needed turning from your back to your side while in a flat  bed without using bedrails?: A Lot Help needed moving from lying on your back to sitting on the side of a flat bed without using bedrails?: A Little Help needed moving to and from a bed to a chair (including a wheelchair)?: A Little Help needed standing up from a chair using your arms (e.g., wheelchair or bedside chair)?: A Little Help needed to walk in hospital room?: A Little Help needed climbing 3-5 steps with a railing? : A Lot 6 Click Score: 16    End of Session Equipment Utilized During Treatment: Gait belt Activity Tolerance: Patient tolerated treatment well Patient left: in bed;with call bell/phone within reach;with bed alarm set;with family/visitor present Nurse Communication: Mobility status PT Visit Diagnosis: Difficulty in walking, not elsewhere classified (R26.2)     Time: 4098-1191 PT Time Calculation (min) (ACUTE ONLY): 29 min  Charges:  $Gait Training: 23-37 mins                     Delice Bison, PT  Acute Rehab Dept ([facility]) [phone] Pager [phone]  05/21/2021    [facility] 05/21/2021, 3:39 PM

## 2021-05-21 NOTE — Progress Notes (Signed)
PROGRESS NOTE    Marissa Gray  AJO:[id number] DOB: 19-Jan-1981 DOA: 05/17/2021 PCP: Merri Brunette, MD    Chief Complaint  Patient presents with  . 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 Patient is status post Fluoroscopic guided aspiration of the left hip without complication, 2 mL of bloody fluid acquired on 5/22, IR drain culture no growth s/p irrigation and debridement of the skin and subcutaneous tissue left hip with jp drain placement and wound vac placement on 05/19/2021, surgical drain culture (subcutaneous fluids culture) grew staph aureus  Subjective:   Worked with OT , just returned from bathroom to bed, she denies hip pain + Jp drain ( blood tinged), + wound vac No fever Father at bedside   Assessment & Plan:   Principal Problem:   Post-operative infection Active Problems:   Rheumatoid arthritis involving multiple sites (HCC)   Gastroesophageal reflux disease without esophagitis   Moderate asthma without complication   Hypothyroidism   Postoperative infection, subcutaneous/left hip -Per Ortho there is no synovial joint infection, infection needs subcutaneous, status post JP drain and wound VAC placement on May/23rd, WBAT, lovenox per ortho -Plan for wound vac and drain to be removed at post op visit in six days, in clinic per ortho -Blood culture no growth -MRSA screening positive -Subcutaneous fluids culture grow staph aureus, has been on vanc and meropenem,  --ID consulted, continue Vanc, discontinue meropenem, follow culture result  Blood loss anemia -drain is  bloody -no other sign of blood loss, will check FOBT -Transfuse if hemoglobin less than 7  Asthma, stable  GERD continue PPI   Down syndrome  FTT To SNF   Nutrition Status: Nutrition Problem: Increased nutrient needs Etiology: acute illness Signs/Symptoms: estimated needs Interventions: Raechel Chute (each supplement provides 350kcal and 20 grams of protein)  .    Unresulted Labs (From admission, onward)          Start     Ordered   05/26/21 0500  Creatinine, serum  (enoxaparin (LOVENOX)    CrCl >/= 30 ml/min)  Weekly,   R     Comments: while on enoxaparin therapy    05/19/21 2118   05/22/21 0500  Creatinine, serum  Daily,   R      05/21/21 1322   05/22/21 0500  CBC with Differential/Platelet  Tomorrow morning,   R        05/21/21 1548   05/22/21 0500  C-reactive protein  Tomorrow morning,   R        05/21/21 1548   05/19/21 1818  Fungus Culture With Stain  RELEASE UPON ORDERING,   TIMED       Comments: Specimen A: Phone [phone] Call Stat Results EZ:MOQHUTM(L): Samson Frederic, MD  Immunocompromised?  No  Antibiotic Treatment:  none Is the patient on airborne/droplet precautions? No Clinical History:  Special Instructions:  none Specimen Disposition:  Microbiology     05/19/21 1818   Unscheduled  Occult blood card to lab, stool RN will collect  As needed,   R     Question:  Specimen to be collected by:  Answer:  RN will collect   05/21/21 [phone]  DVT prophylaxis: enoxaparin (LOVENOX) injection 40 mg Start: 05/20/21 0800 SCDs Start: 05/19/21 2119   Code Status:full  Family Communication: father at bedside  Disposition:   Status is: Inpatient   Dispo: The patient is from: home              Anticipated d/c is to: SNF [facility]              Anticipated d/c date is: TBD, needs ID and ortho clearance                Consultants:   IR  ID  ortho  Procedures:   IR status post Fluoroscopic guided aspiration of the left  hip on 5/22  Ortho, s/p irrigation and debridement of the skin and subcutaneous tissue left hip with jp drain placement and wound vac placement on 05/19/2021,  Antimicrobials:    Anti-infectives (From admission, onward)   Start     Dose/Rate Route Frequency Ordered Stop   05/18/21 1800  vancomycin (VANCOREADY) IVPB 750 mg/150 mL        750 mg 150 mL/hr over 60 Minutes Intravenous Every 24 hours 05/18/21 0649     05/18/21 1600  meropenem (MERREM) 1 g in sodium chloride 0.9 % 100 mL IVPB  Status:  Discontinued        1 g 200 mL/hr over 30 Minutes Intravenous Every 8 hours 05/18/21 1509 05/21/21 1306   05/18/21 0145  vancomycin (VANCOCIN) IVPB 1000 mg/200 mL premix  Status:  Discontinued        1,000 mg 200 mL/hr over 60 Minutes Intravenous  Once 05/18/21 0137 05/18/21 0424         Objective: Vitals:   05/20/21 1808 05/20/21 2213 05/21/21 0508 05/21/21 1339  BP: (!) 102/59 104/60 110/71 (!) 105/52  Pulse: 82 84 90 75  Resp: Temp: 98.2 F (36.8 C) 98.6 F (37 C) 98 F (36.7 C) (!) 97.5 F (36.4 C)  TempSrc: Oral Oral Axillary Oral  SpO2: 100% 98% 99% 98%    Intake/Output Summary (Last 24 hours) at 05/21/2021 1616 Last data filed at 05/21/2021 1541 Gross per 24 hour  Intake 1060 ml  Output 60 ml  Net 1000 ml   There were no vitals filed for this visit.  Examination:  General exam: calm, NAD, aaox3, very pleasant, appear high function  Respiratory system: Clear to auscultation. Respiratory effort normal. Cardiovascular system: S1 & S2 heard, RRR. No JVD, no murmur, No pedal edema. Gastrointestinal system: Abdomen is nondistended, soft and nontender. Normal bowel sounds heard. Central nervous system: Alert and oriented. No focal neurological deficits. Extremities: left hip + jp drain, + wound vac Skin: No rashes, lesions or ulcers Psychiatry: appear high function,  Mood & affect appropriate.     Data Reviewed: I have personally reviewed following labs  and imaging studies  CBC: Recent Labs  Lab 05/17/21 2209 05/19/21 0254 05/19/21 2006 05/20/21 0325 05/21/21 0317  WBC 7.2 4.4  --  5.0  5.2 5.6  NEUTROABS 5.3  --   --   --   --   HGB 9.7* 8.7* 9.5* 7.4*  7.4* 7.0*  HCT 30.5* 27.2* 28.0* 23.3*  23.6* 22.0*  MCV 108.2* 109.2*  --  108.4*  108.8* 107.8*  PLT 486* 442*  --  379  381 399    Basic Metabolic Panel: Recent Labs  Lab 05/18/21 0352 05/19/21 0254 05/19/21 2006 05/20/21 0325 05/21/21 0317  NA 139 140 140  138  --   K 4.0 4.0 4.4 4.4  --   CL 109 109  --  106  --   CO2 24 25  --  25  --   GLUCOSE 91 99  --  114*  --   BUN 9 10  --  5*  --   CREATININE 0.57 0.70  --  0.52  0.53 0.76  CALCIUM 8.1* 7.7*  --  8.0*  --     GFR: CrCl cannot be calculated (Unknown ideal weight.).  Liver Function Tests: Recent Labs  Lab 05/18/21 0352 05/19/21 0254  AST 16 20  ALT 18 23  ALKPHOS 203* 187*  BILITOT 0.5 0.3  PROT 6.8 6.3*  ALBUMIN 2.4* 2.3*    CBG: No results for input(s): GLUCAP in the last 168 hours.   Recent Results (from the past 240 hour(s))  SARS CORONAVIRUS 2 (TAT 6-24 HRS) Nasopharyngeal Nasopharyngeal Swab     Status: None   Collection Time: 05/17/21 10:04 PM   Specimen: Nasopharyngeal Swab  Result Value Ref Range Status   SARS Coronavirus 2 NEGATIVE NEGATIVE Final    Comment: (NOTE) SARS-CoV-2 target nucleic acids are NOT DETECTED.  The SARS-CoV-2 RNA is generally detectable in upper and lower respiratory specimens during the acute phase of infection. Negative results do not preclude SARS-CoV-2 infection, do not rule out co-infections with other pathogens, and should not be used as the sole basis for treatment or other patient management decisions. Negative results must be combined with clinical observations, patient history, and epidemiological information. The expected result is Negative.  Fact Sheet for Patients: HairSlick.no  Fact Sheet for  Healthcare Providers: quierodirigir.com  This test is not yet approved or cleared by the [place] FDA and  has been authorized for detection and/or diagnosis of SARS-CoV-2 by FDA under an Emergency Use Authorization (EUA). This EUA will remain  in effect (meaning this test can be used) for the duration of the COVID-19 declaration under Se ction 564(b)(1) of the Act, 21 U.S.C. section 360bbb-3(b)(1), unless the authorization is terminated or revoked sooner.  Performed at [facility], [address], [place], [place] [address]   Culture, blood (routine x 2)     Status: None (Preliminary result)   Collection Time: 05/18/21  7:55 AM   Specimen: BLOOD  Result Value Ref Range Status   Specimen Description   Final    BLOOD RIGHT ANTECUBITAL Performed at [facility], [address], [place], [place] [address]    Special Requests   Final    BOTTLES DRAWN AEROBIC ONLY Blood Culture adequate volume Performed at [facility], [address], [place], [place] [address]    Culture   Final    NO GROWTH 3 DAYS Performed at [facility], [address], [place], [place] [address]    Report Status PENDING  Incomplete  Culture, blood (routine x 2)     Status: None (Preliminary result)   Collection Time: 05/18/21  7:55 AM   Specimen: BLOOD  Result Value Ref Range Status   Specimen Description   Final    BLOOD RIGHT ANTECUBITAL Performed at [facility], [address] [place], [place] [address]    Special Requests   Final    BOTTLES DRAWN AEROBIC ONLY Blood Culture adequate volume Performed at [facility], [address], [place], [place] [address]    Culture   Final    NO GROWTH 3  DAYS Performed at [facility], [address] [place], [place] [address]    Report Status PENDING  Incomplete  Body fluid culture w Gram Stain     Status: None (Preliminary result)    Collection Time: 05/18/21 12:00 PM   Specimen: PATH Cytology Misc. fluid; Synovial Fluid  Result Value Ref Range Status   Specimen Description HIP  Final   Special Requests LEFT HIP  Final   Gram Stain   Final    RARE WBC PRESENT, PREDOMINANTLY MONONUCLEAR NO ORGANISMS SEEN    Culture   Final    NO GROWTH 3 DAYS Performed at [facility], [address] [place], [place] [address]    Report Status PENDING  Incomplete  MRSA PCR Screening     Status: Abnormal   Collection Time: 05/18/21 12:30 PM   Specimen: Nasal Mucosa; Nasopharyngeal  Result Value Ref Range Status   MRSA by PCR POSITIVE (A) NEGATIVE Final    Comment:        The GeneXpert MRSA Assay (FDA approved for NASAL specimens only), is one component of a comprehensive MRSA colonization surveillance program. It is not intended to diagnose MRSA infection nor to guide or monitor treatment for MRSA infections. RESULT CALLED TO, READ BACK BY AND VERIFIED WITH: MILLER,J. RN AT 1436 05/18/21 MULLINS,T Performed at [facility], [address] [place], [place] [address]   Aerobic/Anaerobic Culture w Gram Stain (surgical/deep wound)     Status: None (Preliminary result)   Collection Time: 05/19/21  6:15 PM   Specimen: Synovial, Left Hip; Body Fluid  Result Value Ref Range Status   Specimen Description   Final    SYNOVIAL LEFT HIP Performed at [facility], [address], [place], [place] [address]    Special Requests   Final    NONE Performed at [facility], [address] [place], [place] [address]    Gram Stain   Final    RARE WBC PRESENT, PREDOMINANTLY PMN FEW GRAM POSITIVE COCCI IN PAIRS IN CLUSTERS Performed at [facility], [address] [place], [place] [address]    Culture   Final    FEW STAPHYLOCOCCUS AUREUS SUSCEPTIBILITIES TO FOLLOW NO ANAEROBES ISOLATED; CULTURE IN PROGRESS FOR 5 DAYS    Report Status PENDING  Incomplete          Radiology Studies: No results found.      Scheduled Meds: . [START ON 05/22/2021] Chlorhexidine Gluconate Cloth  6 each Topical Q0600  . docusate sodium  100 mg Oral BID  . enoxaparin (LOVENOX) injection  40 mg Subcutaneous Q24H  . folic acid  1 mg Oral Daily  . mupirocin ointment  1 application Nasal BID  . nutrition supplement (JUVEN)  1 packet Oral BID BM  . pantoprazole  40 mg Oral Daily  . senna  1 tablet Oral BID  . sodium chloride  1 spray Nasal BID   Continuous Infusions: . vancomycin 750 mg (05/20/21 1753)     LOS: 4 days   Time spent: Greater than 50% of this time was spent in counseling, explanation of diagnosis, planning of further management, and coordination of care.   Voice Recognition Reubin Milan dictation system was used to create this note, attempts have been made to correct errors. Please contact the author with questions and/or clarifications.   Albertine Grates, MD PhD FACP [facility]  Available via Epic secure chat 7am-7pm for nonurgent issues Please page  for urgent issues To page the attending provider between 7A-7P or the covering provider during after hours 7P-7A, please log into the web site [email or web address] and access using universal [place] password for that web site. If you do not have the password, please call the hospital operator.    05/21/2021, 4:16 PM

## 2021-05-21 NOTE — TOC Progression Note (Signed)
Transition of Care [facility]) - Progression Note   Patient Details  Name: Marissa Gray MRN: [id number] Date of Birth: 1981/11/25  [facility]) CM/SW Contact  Ewing Schlein, LCSW Phone Number: 05/21/2021, 10:11 AM  Clinical Narrative: CSW updated patient's mother that [facility] is willing to accept the patient back and will take her back on IV antibiotics if needed. TOC to follow.  Expected Discharge Plan: Skilled Nursing Facility Barriers to Discharge: Continued Medical Work up  Expected Discharge Plan and Services Expected Discharge Plan: Skilled Nursing Facility In-house Referral: Clinical Social Work Post Acute Care Choice: Skilled Nursing Facility Living arrangements for the past 2 months: Group Home              DME Arranged: N/A DME Agency: NA  Readmission Risk Interventions No flowsheet data found.

## 2021-05-21 NOTE — Progress Notes (Signed)
Occupational Therapy Treatment Patient Details Name: Marissa Gray MRN: [id number] DOB: 1981/07/18 Today's Date: 05/21/2021    History of present illness Patient is a 40 year old woman with Down's Syndrome s/p recent  left posterior THA per Dr Lequita Halt. admitted with surgical site infection now s/p Excisional debridement of skin and subcutaneous tissue left hip per Dr Linna Caprice on 05/19/21.   OT comments  Patient progressing and showed improved ability to use adaptive equipment for LE ADLs with moderate assist and stood to perform grooming compared to previous session where pt required seated grooming activities. Patient limited by pain and posterior hip precautions/restrictions along lines from [facility] and drain and with deficits noted below. Pt continues to demonstrate good rehab potential and would benefit from continued skilled OT to increase safety and independence with ADLs and functional transfers to allow pt to return home safely and reduce caregiver burden and fall risk.   Follow Up Recommendations  SNF;Supervision/Assistance - 24 hour    Equipment Recommendations  None recommended by OT    Recommendations for Other Services      Precautions / Restrictions Precautions Precautions: Posterior Hip Precaution Booklet Issued: Yes (comment) Precaution Comments: Pt recalls hip precautions but required explanation of not breaking 90 degrees hip flexion. Restrictions Weight Bearing Restrictions: Yes Other Position/Activity Restrictions: pt with JP drain and VAC L hip       Mobility Bed Mobility Overal bed mobility: Needs Assistance Bed Mobility: Supine to Sit     Supine to sit: Min assist Sit to supine: Mod assist   General bed mobility comments: cues for technique and THP, incr time, assist with LEs    Transfers Overall transfer level: Needs assistance Equipment used: Rolling walker (2 wheeled) Transfers: Sit to/from [employer] Sit to Stand: Min assist Stand  pivot transfers: Min assist            Balance Overall balance assessment: Needs assistance Sitting-balance support: No upper extremity supported;Feet supported Sitting balance-Leahy Scale: Good       Standing balance-Leahy Scale: Poor Standing balance comment: Pt heavily reliant on walker.                           ADL either performed or assessed with clinical judgement   ADL Overall ADL's : Needs assistance/impaired Eating/Feeding: Independent   Grooming: Brushing hair;Standing Grooming Details (indicate cue type and reason): Pt stood with one hand on RW and other brushing hair with supervision.       Lower Body Bathing Details (indicate cue type and reason): Pt discussed use of long handled bath brush to reach lower body while following hip precautions. Pt verb understanding.     Lower Body Dressing: Sit to/from stand;Sitting/lateral leans;Cueing for compensatory techniques;Moderate assistance Lower Body Dressing Details (indicate cue type and reason): Pt trained on AE and able to doff socks with moderate assist using dressing stick. Sock aid did not work well for pt and pt's father stated pt had tried before with poor success due to pt's small hands.  Pt trained on long shoe hown for shoes and donned with moderate-max assistance while sitting EOB. Toilet Transfer: RW;BSC;Minimal assistance Toilet Transfer Details (indicate cue type and reason): Pt ambulated in room to bathroom and t/f to [facility] with verbal cues for positioning. Min As for on/off BSC. Cues for hand placement. Toileting- Clothing Manipulation and Hygiene: Total assistance;Sit to/from stand Toileting - Clothing Manipulation Details (indicate cue type and reason): assist to clean  self. Pt has toilet aid that is at the SNF but does not like it. Pt educated on possible bidet options for return to group home.     Functional mobility during ADLs: Minimal assistance;Rolling walker;Moderate assistance        Vision Baseline Vision/History: Wears glasses Wears Glasses: At all times Patient Visual Report: No change from baseline Vision Assessment?: No apparent visual deficits   Perception     Praxis      Cognition Arousal/Alertness: Awake/alert Behavior During Therapy: WFL for tasks assessed/performed Overall Cognitive Status: Within Functional Limits for tasks assessed                                 General Comments: Pt with good memory and recall of previous surgeries and how to do things the way therapists have instructed.  Very motivated.        Exercises     Shoulder Instructions       General Comments      Pertinent Vitals/ Pain       Pain Assessment: Faces Faces Pain Scale: Hurts a little bit Pain Location: L hip Pain Descriptors / Indicators: Tightness Pain Intervention(s): Limited activity within patient's tolerance;Monitored during session;Premedicated before session;Repositioned  Home Living                                          Prior Functioning/Environment              Frequency  Min 2X/week        Progress Toward Goals  OT Goals(current goals can now be found in the care plan section)  Progress towards OT goals: Progressing toward goals  Acute Rehab OT Goals Patient Stated Goal: To be more independent in order to return to Group Home OT Goal Formulation: With patient/family Time For Goal Achievement: 06/03/21 Potential to Achieve Goals: Good  Plan Discharge plan remains appropriate    Co-evaluation                 AM-PAC OT "6 Clicks" Daily Activity     Outcome Measure   Help from another person eating meals?: None Help from another person taking care of personal grooming?: A Little Help from another person toileting, which includes using toliet, bedpan, or urinal?: A Lot Help from another person bathing (including washing, rinsing, drying)?: A Lot Help from another person to put on and  taking off regular upper body clothing?: A Little Help from another person to put on and taking off regular lower body clothing?: A Lot 6 Click Score: 16    End of Session Equipment Utilized During Treatment: Rolling walker;Gait belt  OT Visit Diagnosis: Other abnormalities of gait and mobility (R26.89);Pain Pain - Right/Left: Left Pain - part of body: Hip   Activity Tolerance Patient tolerated treatment well   Patient Left with call bell/phone within reach;with family/visitor present;in bed (MD in room.)   Nurse Communication Mobility status        Time: 1610-9604 OT Time Calculation (min): 39 min  Charges: OT General Charges $OT Visit: 1 Visit OT Treatments $Self Care/Home Management : 23-37 mins $Therapeutic Activity: 8-22 mins  Victorino Dike, OT Acute Rehab Services Office: [phone] 05/21/2021  Theodoro Clock 05/21/2021, 2:52 PM

## 2021-05-22 ENCOUNTER — Encounter (HOSPITAL_COMMUNITY): Payer: Self-pay | Admitting: Orthopedic Surgery

## 2021-05-22 DIAGNOSIS — T8140XA Infection following a procedure, unspecified, initial encounter: Secondary | ICD-10-CM

## 2021-05-22 LAB — BODY FLUID CULTURE W GRAM STAIN: Culture: NO GROWTH

## 2021-05-22 LAB — CBC WITH DIFFERENTIAL/PLATELET
Abs Immature Granulocytes: 0.29 10*3/uL — ABNORMAL HIGH (ref 0.00–0.07)
Basophils Absolute: 0 10*3/uL (ref 0.0–0.1)
Basophils Relative: 1 %
Eosinophils Absolute: 0.1 10*3/uL (ref 0.0–0.5)
Eosinophils Relative: 2 %
HCT: 24.1 % — ABNORMAL LOW (ref 36.0–46.0)
Hemoglobin: 7.4 g/dL — ABNORMAL LOW (ref 12.0–15.0)
Immature Granulocytes: 5 %
Lymphocytes Relative: 34 %
Lymphs Abs: 2.1 10*3/uL (ref 0.7–4.0)
MCH: 34.4 pg — ABNORMAL HIGH (ref 26.0–34.0)
MCHC: 30.7 g/dL (ref 30.0–36.0)
MCV: 112.1 fL — ABNORMAL HIGH (ref 80.0–100.0)
Monocytes Absolute: 0.4 10*3/uL (ref 0.1–1.0)
Monocytes Relative: 7 %
Neutro Abs: 3.3 10*3/uL (ref 1.7–7.7)
Neutrophils Relative %: 51 %
Platelets: 419 10*3/uL — ABNORMAL HIGH (ref 150–400)
RBC: 2.15 MIL/uL — ABNORMAL LOW (ref 3.87–5.11)
RDW: 14.4 % (ref 11.5–15.5)
WBC: 6.3 10*3/uL (ref 4.0–10.5)
nRBC: 1.3 % — ABNORMAL HIGH (ref 0.0–0.2)

## 2021-05-22 LAB — TYPE AND SCREEN
ABO/RH(D): O POS
Antibody Screen: NEGATIVE
Unit division: 0
Unit division: 0

## 2021-05-22 LAB — C-REACTIVE PROTEIN: CRP: 5.1 mg/dL — ABNORMAL HIGH (ref <1.0)

## 2021-05-22 LAB — BPAM RBC
Blood Product Expiration Date: 202206232359
Blood Product Expiration Date: 202206232359
Unit Type and Rh: 5100
Unit Type and Rh: 5100

## 2021-05-22 LAB — CREATININE, SERUM
Creatinine, Ser: 0.63 mg/dL (ref 0.44–1.00)
GFR, Estimated: >60 mL/min (ref >60)

## 2021-05-22 NOTE — Progress Notes (Signed)
PROGRESS NOTE    Marissa Gray  TRV:[id number] DOB: [birth year] DOA: 05/17/2021 PCP: Merri Brunette, MD    Chief Complaint  Patient presents with  . Post-op Problem    Brief Narrative:   40 year old female with past medical history of Down syndrome, hypothyroidism, asthma, gastroesophageal reflux disease, rheumatoid arthritis ( MTX recently stopped ) and recent left hip total arthroplasty on 4/27 who presents to [facility] emergency department as a transfer from [facility] emergency department due to increasing drainage and tenderness at the surgical site. Patient is status post Fluoroscopic guided aspiration of the left hip without complication, 2 mL of bloody fluid acquired on 5/22, IR drain culture no growth s/p irrigation and debridement of the skin and subcutaneous tissue left hip with jp drain placement and wound vac placement on 05/19/2021, surgical drain culture (subcutaneous fluids culture) grew staph aureus  Subjective:   Sitting in bed,  she denies hip pain + Jp drain ( blood tinged), 80cc documented last 24hrs, + wound vac No fever Mother  at bedside   Assessment & Plan:   Principal Problem:   Post-operative infection Active Problems:   Rheumatoid arthritis involving multiple sites (HCC)   Gastroesophageal reflux disease without esophagitis   Moderate asthma without complication   Hypothyroidism   Postoperative infection, subcutaneous/left hip -Per Ortho there is no synovial joint infection, infection needs subcutaneous, status post JP drain and wound VAC placement on May/23rd, WBAT, lovenox per ortho -Plan for wound vac and drain to be removed at post op visit in six days, in clinic per ortho -Blood culture no growth -MRSA screening positive -Subcutaneous fluids culture grow staph aureus, has been on vanc and meropenem,  --ID consulted, continue Vanc, discontinue meropenem, follow culture result  Blood loss anemia -drain is  bloody -no other sign of blood loss,  FOBT negative -Transfuse if hemoglobin less than 7  Asthma, stable  GERD continue PPI   Down syndrome  FTT To SNF   Nutrition Status: Nutrition Problem: Increased nutrient needs Etiology: acute illness Signs/Symptoms: estimated needs Interventions: Raechel Chute (each supplement provides 350kcal and 20 grams of protein)  .    Unresulted Labs (From admission, onward)          Start     Ordered   05/26/21 0500  Creatinine, serum  (enoxaparin (LOVENOX)    CrCl >/= 30 ml/min)  Weekly,   R     Comments: while on enoxaparin therapy    05/19/21 2118   05/22/21 0500  Creatinine, serum  Daily,   R      05/21/21 1322   05/22/21 0500  CBC with Differential/Platelet  Tomorrow morning,   R        05/21/21 1548   05/22/21 0500  C-reactive protein  Tomorrow morning,   R        05/21/21 1548   05/19/21 1818  Fungus Culture With Stain  RELEASE UPON ORDERING,   TIMED       Comments: Specimen A: Phone [phone] Call Stat Results [id number](M): Samson Frederic, MD  Immunocompromised?  No  Antibiotic Treatment:  none Is the patient on airborne/droplet precautions? No Clinical History:  Special Instructions:  none Specimen Disposition:  Microbiology     05/19/21 1818   Unscheduled  Occult blood card to lab, stool RN will collect  As needed,   R     Question:  Specimen to be collected by:  Answer:  RN will collect   05/21/21 [id number]  DVT prophylaxis: enoxaparin (LOVENOX) injection 40 mg Start: 05/20/21 0800 SCDs Start: 05/19/21 2119   Code Status:full  Family Communication: Mother at bedside  Disposition:   Status is: Inpatient   Dispo: The patient is from: home              Anticipated d/c is to: SNF [facility] )              Anticipated d/c date is: Likely tomorrow on May 27 ,needs ID and ortho clearance                Consultants:   IR  ID  ortho  Procedures:   IR status post Fluoroscopic guided  aspiration of the left hip on 5/22  Ortho, s/p irrigation and debridement of the skin and subcutaneous tissue left hip with jp drain placement and wound vac placement on 05/19/2021,  Antimicrobials:    Anti-infectives (From admission, onward)   Start     Dose/Rate Route Frequency Ordered Stop   05/18/21 1800  vancomycin (VANCOREADY) IVPB 750 mg/150 mL        750 mg 150 mL/hr over 60 Minutes Intravenous Every 24 hours 05/18/21 0649     05/18/21 1600  meropenem (MERREM) 1 g in sodium chloride 0.9 % 100 mL IVPB  Status:  Discontinued        1 g 200 mL/hr over 30 Minutes Intravenous Every 8 hours 05/18/21 1509 05/21/21 1306   05/18/21 0145  vancomycin (VANCOCIN) IVPB 1000 mg/200 mL premix  Status:  Discontinued        1,000 mg 200 mL/hr over 60 Minutes Intravenous  Once 05/18/21 0137 05/18/21 0424         Objective: Vitals:   05/21/21 0508 05/21/21 1339 05/21/21 2122 05/22/21 0524  BP: 110/71 (!) 105/52 116/63 101/61  Pulse: 90 75 85 65  Resp: 16 16 17 16   Temp: 98 F (36.7 C) (!) 97.5 F (36.4 C) 98.1 F (36.7 C) 98 F (36.7 C)  TempSrc: Axillary Oral Oral Oral  SpO2: 99% 98% 97% 97%    Intake/Output Summary (Last 24 hours) at 05/22/2021 1145 Last data filed at 05/22/2021 0853 Gross per 24 hour  Intake 610 ml  Output 80 ml  Net 530 ml   There were no vitals filed for this visit.  Examination:  General exam: calm, NAD, aaox3, very pleasant, appear high function  Respiratory system: Clear to auscultation. Respiratory effort normal. Cardiovascular system: S1 & S2 heard, RRR. No JVD, no murmur, No pedal edema. Gastrointestinal system: Abdomen is nondistended, soft and nontender. Normal bowel sounds heard. Central nervous system: Alert and oriented. No focal neurological deficits. Extremities: left hip + jp drain, + wound vac Skin: No rashes, lesions or ulcers Psychiatry: appear high function,  Mood & affect appropriate.     Data Reviewed: I have personally reviewed  following labs and imaging studies  CBC: Recent Labs  Lab 05/17/21 2209 05/19/21 0254 05/19/21 2006 05/20/21 0325 05/21/21 0317 05/22/21 0315  WBC 7.2 4.4  --  5.0  5.2 5.6 6.3  NEUTROABS 5.3  --   --   --   --  3.3  HGB 9.7* 8.7* 9.5* 7.4*  7.4* 7.0* 7.4*  HCT 30.5* 27.2* 28.0* 23.3*  23.6* 22.0* 24.1*  MCV 108.2* 109.2*  --  108.4*  108.8* 107.8* 112.1*  PLT 486* 442*  --  379  381 399 419*    Basic Metabolic Panel: Recent Labs  Lab 05/18/21 0352 05/19/21 0254  05/19/21 2006 05/20/21 0325 05/21/21 0317 05/22/21 0315  NA 139 140 140 138  --   --   K 4.0 4.0 4.4 4.4  --   --   CL 109 109  --  106  --   --   CO2 24 25  --  25  --   --   GLUCOSE 91 99  --  114*  --   --   BUN 9 10  --  5*  --   --   CREATININE 0.57 0.70  --  0.52  0.53 0.76 0.63  CALCIUM 8.1* 7.7*  --  8.0*  --   --     GFR: CrCl cannot be calculated (Unknown ideal weight.).  Liver Function Tests: Recent Labs  Lab 05/18/21 0352 05/19/21 0254  AST 16 20  ALT 18 23  ALKPHOS 203* 187*  BILITOT 0.5 0.3  PROT 6.8 6.3*  ALBUMIN 2.4* 2.3*    CBG: No results for input(s): GLUCAP in the last 168 hours.   Recent Results (from the past 240 hour(s))  SARS CORONAVIRUS 2 (TAT 6-24 HRS) Nasopharyngeal Nasopharyngeal Swab     Status: None   Collection Time: 05/17/21 10:04 PM   Specimen: Nasopharyngeal Swab  Result Value Ref Range Status   SARS Coronavirus 2 NEGATIVE NEGATIVE Final    Comment: (NOTE) SARS-CoV-2 target nucleic acids are NOT DETECTED.  The SARS-CoV-2 RNA is generally detectable in upper and lower respiratory specimens during the acute phase of infection. Negative results do not preclude SARS-CoV-2 infection, do not rule out co-infections with other pathogens, and should not be used as the sole basis for treatment or other patient management decisions. Negative results must be combined with clinical observations, patient history, and epidemiological information. The  expected result is Negative.  Fact Sheet for Patients: HairSlick.no  Fact Sheet for Healthcare Providers: quierodirigir.com  This test is not yet approved or cleared by the [place] FDA and  has been authorized for detection and/or diagnosis of SARS-CoV-2 by FDA under an Emergency Use Authorization (EUA). This EUA will remain  in effect (meaning this test can be used) for the duration of the COVID-19 declaration under Se ction 564(b)(1) of the Act, 21 U.S.C. section 360bbb-3(b)(1), unless the authorization is terminated or revoked sooner.  Performed at [facility], [address] [place], [place] [address]   Culture, blood (routine x 2)     Status: None (Preliminary result)   Collection Time: 05/18/21  7:55 AM   Specimen: BLOOD  Result Value Ref Range Status   Specimen Description   Final    BLOOD RIGHT ANTECUBITAL Performed at [facility], [address] [place], [place] [address]    Special Requests   Final    BOTTLES DRAWN AEROBIC ONLY Blood Culture adequate volume Performed at [facility], [address] [place], [place] [address]    Culture   Final    NO GROWTH 4 DAYS Performed at [facility], [address] [place], [place] [address]    Report Status PENDING  Incomplete  Culture, blood (routine x 2)     Status: None (Preliminary result)   Collection Time: 05/18/21  7:55 AM   Specimen: BLOOD  Result Value Ref Range Status   Specimen Description   Final    BLOOD RIGHT ANTECUBITAL Performed at [facility], [address], [place], [place] [address]    Special Requests   Final  BOTTLES DRAWN AEROBIC ONLY Blood Culture adequate volume Performed at [facility], [address] [place], [place] [address]    Culture   Final    NO GROWTH 4 DAYS Performed at [facility], [address] [place], [place]  [address]    Report Status PENDING  Incomplete  Body fluid culture w Gram Stain     Status: None   Collection Time: 05/18/21 12:00 PM   Specimen: PATH Cytology Misc. fluid; Synovial Fluid  Result Value Ref Range Status   Specimen Description HIP  Final   Special Requests LEFT HIP  Final   Gram Stain   Final    RARE WBC PRESENT, PREDOMINANTLY MONONUCLEAR NO ORGANISMS SEEN    Culture   Final    NO GROWTH 3 DAYS Performed at [facility], [address] [place], [place] [address]    Report Status 05/22/2021 FINAL  Final  MRSA PCR Screening     Status: Abnormal   Collection Time: 05/18/21 12:30 PM   Specimen: Nasal Mucosa; Nasopharyngeal  Result Value Ref Range Status   MRSA by PCR POSITIVE (A) NEGATIVE Final    Comment:        The GeneXpert MRSA Assay (FDA approved for NASAL specimens only), is one component of a comprehensive MRSA colonization surveillance program. It is not intended to diagnose MRSA infection nor to guide or monitor treatment for MRSA infections. RESULT CALLED TO, READ BACK BY AND VERIFIED WITH: MILLER,J. RN AT 1436 05/18/21 MULLINS,T Performed at [facility], [address], [place], [place] [address]   Fungus Culture With Stain     Status: None (Preliminary result)   Collection Time: 05/19/21  6:15 PM   Specimen: Synovial, Left Hip; Body Fluid  Result Value Ref Range Status   Fungus Stain Final report  Final    Comment: (NOTE) Performed At: [facility] [address] [place], [place] [id number] Jolene Schimke MD MV:[id number]    Fungus (Mycology) Culture PENDING  Incomplete   Fungal Source SYNOVIAL  Final    Comment: LEFT HIP Performed at [facility], [address], [place], [place] [address]   Aerobic/Anaerobic Culture w Gram Stain (surgical/deep wound)     Status: None (Preliminary result)   Collection Time: 05/19/21  6:15 PM   Specimen: Synovial, Left Hip; Body Fluid  Result Value Ref  Range Status   Specimen Description   Final    SYNOVIAL LEFT HIP Performed at [facility], [address] [place], [place] [address]    Special Requests   Final    NONE Performed at [facility], [address] [place], [place] [address]    Gram Stain   Final    RARE WBC PRESENT, PREDOMINANTLY PMN FEW GRAM POSITIVE COCCI IN PAIRS IN CLUSTERS Performed at [facility], [address] [place], [place] [address]    Culture   Final    FEW STAPHYLOCOCCUS AUREUS SUSCEPTIBILITIES TO FOLLOW NO ANAEROBES ISOLATED; CULTURE IN PROGRESS FOR 5 DAYS    Report Status PENDING  Incomplete  Fungus Culture Result     Status: None   Collection Time: 05/19/21  6:15 PM  Result Value Ref Range Status   Result 1 Comment  Final    Comment: (NOTE) KOH/Calcofluor preparation:  no fungus observed. Performed At: [facility] [address] [place], [place] [id number] Jolene Schimke MD [id number]  Radiology Studies: No results found.      Scheduled Meds: . Chlorhexidine Gluconate Cloth  6 each Topical Q0600  . docusate sodium  100 mg Oral BID  . enoxaparin (LOVENOX) injection  40 mg Subcutaneous Q24H  . folic acid  1 mg Oral Daily  . mupirocin ointment  1 application Nasal BID  . nutrition supplement (JUVEN)  1 packet Oral BID BM  . pantoprazole  40 mg Oral Daily  . senna  1 tablet Oral BID  . sodium chloride  1 spray Nasal BID   Continuous Infusions: . vancomycin 750 mg (05/21/21 1715)     LOS: 5 days   Time spent: Greater than 50% of this time was spent in counseling, explanation of diagnosis, planning of further management, and coordination of care.   Voice Recognition Reubin Milan dictation system was used to create this note, attempts have been made to correct errors. Please contact the author with questions and/or clarifications.   Albertine Grates, MD PhD FACP [facility]  Available via Epic secure chat  7am-7pm for nonurgent issues Please page for urgent issues To page the attending provider between 7A-7P or the covering provider during after hours 7P-7A, please log into the web site [email or web address] and access using universal [place] password for that web site. If you do not have the password, please call the hospital operator.    05/22/2021, 11:45 AM

## 2021-05-22 NOTE — Progress Notes (Signed)
[facility]   Reason for visit: Follow up on surgical wound infection  Interval History: bacteria growth with MRSA, tetracycline sensitive WBC wnl, no fever Day 5 total antibiotics  Physical Exam: Constitutional:  Vitals:   05/22/21 0524 05/22/21 1342  BP: 101/61 (!) 98/47  Pulse: 65 83  Resp: 16 16  Temp: 98 F (36.7 C) 97.8 F (36.6 C)  SpO2: 97% 95%   patient appears in NAD Respiratory: Normal respiratory effort; CTA B Cardiovascular: RRR MS: left leg with wound VAC and JP drain in place; no surrounding erythema  Review of Systems: Constitutional: negative for fevers and chills Gastrointestinal: negative for nausea and diarrhea Integument/breast: negative for rash  Lab Results  Component Value Date   WBC 6.3 05/22/2021   HGB 7.4 (L) 05/22/2021   HCT 24.1 (L) 05/22/2021   MCV 112.1 (H) 05/22/2021   PLT 419 (H) 05/22/2021    Lab Results  Component Value Date   CREATININE 0.63 05/22/2021   BUN 5 (L) 05/20/2021   NA 138 05/20/2021   K 4.4 05/20/2021   CL 106 05/20/2021   CO2 25 05/20/2021    Lab Results  Component Value Date   ALT 23 05/19/2021   AST 20 05/19/2021   ALKPHOS 187 (H) 05/19/2021     Microbiology: Recent Results (from the past 240 hour(s))  SARS CORONAVIRUS 2 (TAT 6-24 HRS) Nasopharyngeal Nasopharyngeal Swab     Status: None   Collection Time: 05/17/21 10:04 PM   Specimen: Nasopharyngeal Swab  Result Value Ref Range Status   SARS Coronavirus 2 NEGATIVE NEGATIVE Final    Comment: (NOTE) SARS-CoV-2 target nucleic acids are NOT DETECTED.  The SARS-CoV-2 RNA is generally detectable in upper and lower respiratory specimens during the acute phase of infection. Negative results do not preclude SARS-CoV-2 infection, do not rule out co-infections with other pathogens, and should not be used as the sole basis for treatment or other patient management decisions. Negative results must be combined with clinical  observations, patient history, and epidemiological information. The expected result is Negative.  Fact Sheet for Patients: HairSlick.no  Fact Sheet for Healthcare Providers: quierodirigir.com  This test is not yet approved or cleared by the [place] FDA and  has been authorized for detection and/or diagnosis of SARS-CoV-2 by FDA under an Emergency Use Authorization (EUA). This EUA will remain  in effect (meaning this test can be used) for the duration of the COVID-19 declaration under Se ction 564(b)(1) of the Act, 21 U.S.C. section 360bbb-3(b)(1), unless the authorization is terminated or revoked sooner.  Performed at [facility], [address], [place], [place] [address]   Culture, blood (routine x 2)     Status: None (Preliminary result)   Collection Time: 05/18/21  7:55 AM   Specimen: BLOOD  Result Value Ref Range Status   Specimen Description   Final    BLOOD RIGHT ANTECUBITAL Performed at [facility], [address], [place], [place] [address]    Special Requests   Final    BOTTLES DRAWN AEROBIC ONLY Blood Culture adequate volume Performed at [facility], [address] [place], [place] [address]    Culture   Final    NO GROWTH 4 DAYS Performed at [facility], [address] [place], [place] [address]    Report Status PENDING  Incomplete  Culture, blood (routine x 2)     Status: None (Preliminary result)   Collection  Time: 05/18/21  7:55 AM   Specimen: BLOOD  Result Value Ref Range Status   Specimen Description   Final    BLOOD RIGHT ANTECUBITAL Performed at [facility], [address] [place], [place] [address]    Special Requests   Final    BOTTLES DRAWN AEROBIC ONLY Blood Culture adequate volume Performed at [facility], [address] [place], [place] [address]    Culture   Final    NO GROWTH 4  DAYS Performed at [facility], [address], [place], [place] [address]    Report Status PENDING  Incomplete  Body fluid culture w Gram Stain     Status: None   Collection Time: 05/18/21 12:00 PM   Specimen: PATH Cytology Misc. fluid; Synovial Fluid  Result Value Ref Range Status   Specimen Description HIP  Final   Special Requests LEFT HIP  Final   Gram Stain   Final    RARE WBC PRESENT, PREDOMINANTLY MONONUCLEAR NO ORGANISMS SEEN    Culture   Final    NO GROWTH 3 DAYS Performed at [facility], [address] [place], [place] [address]    Report Status 05/22/2021 FINAL  Final  MRSA PCR Screening     Status: Abnormal   Collection Time: 05/18/21 12:30 PM   Specimen: Nasal Mucosa; Nasopharyngeal  Result Value Ref Range Status   MRSA by PCR POSITIVE (A) NEGATIVE Final    Comment:        The GeneXpert MRSA Assay (FDA approved for NASAL specimens only), is one component of a comprehensive MRSA colonization surveillance program. It is not intended to diagnose MRSA infection nor to guide or monitor treatment for MRSA infections. RESULT CALLED TO, READ BACK BY AND VERIFIED WITH: MILLER,J. RN AT 1436 05/18/21 MULLINS,T Performed at [facility], [address] [place], [place] [address]   Fungus Culture With Stain     Status: None (Preliminary result)   Collection Time: 05/19/21  6:15 PM   Specimen: Synovial, Left Hip; Body Fluid  Result Value Ref Range Status   Fungus Stain Final report  Final    Comment: (NOTE) Performed At: [facility] [address] [place], [place] [id number] Jolene Schimke MD NI:[id number]    Fungus (Mycology) Culture PENDING  Incomplete   Fungal Source SYNOVIAL  Final    Comment: LEFT HIP Performed at [facility], [address], [place], [place] [address]   Aerobic/Anaerobic Culture w Gram Stain (surgical/deep wound)     Status: None (Preliminary result)   Collection Time: 05/19/21   6:15 PM   Specimen: Synovial, Left Hip; Body Fluid  Result Value Ref Range Status   Specimen Description   Final    SYNOVIAL LEFT HIP Performed at [facility], [address] [place], [place] [address]    Special Requests   Final    NONE Performed at [facility], [address] [place], [place] [address]    Gram Stain   Final    RARE WBC PRESENT, PREDOMINANTLY PMN FEW GRAM POSITIVE COCCI IN PAIRS IN CLUSTERS Performed at [facility], [address] [place], [place] [address]    Culture   Final    FEW METHICILLIN RESISTANT STAPHYLOCOCCUS AUREUS NO ANAEROBES ISOLATED; CULTURE IN PROGRESS FOR 5 DAYS    Report Status PENDING  Incomplete   Organism ID, Bacteria METHICILLIN RESISTANT STAPHYLOCOCCUS AUREUS  Final  Susceptibility   Methicillin resistant staphylococcus aureus - MIC*    CIPROFLOXACIN >=8 RESISTANT Resistant     ERYTHROMYCIN >=8 RESISTANT Resistant     GENTAMICIN <=0.5 SENSITIVE Sensitive     OXACILLIN >=4 RESISTANT Resistant     TETRACYCLINE <=1 SENSITIVE Sensitive     VANCOMYCIN 1 SENSITIVE Sensitive     TRIMETH/SULFA >=320 RESISTANT Resistant     CLINDAMYCIN <=0.25 SENSITIVE Sensitive     RIFAMPIN <=0.5 SENSITIVE Sensitive     Inducible Clindamycin NEGATIVE Sensitive     * FEW METHICILLIN RESISTANT STAPHYLOCOCCUS AUREUS  Fungus Culture Result     Status: None   Collection Time: 05/19/21  6:15 PM  Result Value Ref Range Status   Result 1 Comment  Final    Comment: (NOTE) KOH/Calcofluor preparation:  no fungus observed. Performed At: [facility] [address] [place], [place] [id number] Jolene Schimke MD WU:[id number]     Impression/Plan:  1. Post operative wound infection - left total hip arthroplasty done in April and now a wound infection with purulent drainage, now s/p debridement.  No joint involvement, synovial fluid with about 2000 WBCs.  Growth of MRSA on culture, tetracycline and  clindamycin sensitive Will treat with doxycycline, 100 mg twice a day for 2 more weeks  2. Anemia - hgb stable at 7.4.   Follow up arranged with Dr. Renold Don in 2 weeks. I will sign off, call with questions

## 2021-05-22 NOTE — Progress Notes (Signed)
Physical Therapy Treatment Patient Details Name: Marissa Gray MRN: [id number] DOB: [birth year] Today's Date: 05/22/2021    History of Present Illness Patient is a 40 year old woman with Down's Syndrome s/p recent  left posterior THA per Dr Lequita Halt. admitted with surgical site infection now s/p Excisional debridement of skin and subcutaneous tissue left hip per Dr Linna Caprice on 05/19/21.    PT Comments    POD # 3 L Hip I&D  Assisted OOB to amb in hallway, assisted to bathroom then positioned in recliner.    Follow Up Recommendations  SNF     Equipment Recommendations  None recommended by PT    Recommendations for Other Services       Precautions / Restrictions Precautions Precautions: Posterior Hip Precaution Booklet Issued: Yes (comment) Precaution Comments: Pt recalls hip precautions but required cues to avoid breaking  90 degrees hip flexion in bed and in sitting EOB Restrictions Weight Bearing Restrictions: No LLE Weight Bearing: Weight bearing as tolerated Other Position/Activity Restrictions: pt with JP drain and VAC L hip    Mobility  Bed Mobility Overal bed mobility: Needs Assistance Bed Mobility: Supine to Sit     Supine to sit: Min assist     General bed mobility comments: cues for technique and THP, incr time, assist with LEs    Transfers Overall transfer level: Needs assistance Equipment used: Rolling walker (2 wheeled) Transfers: Sit to/from Stand Sit to Stand: Min assist Stand pivot transfers: Min assist       General transfer comment: 25% cues for hand placement and LLE position/THP  Ambulation/Gait Ambulation/Gait assistance: Min guard;Min assist Gait Distance (Feet): 55 Feet Assistive device: Rolling walker (2 wheeled) Gait Pattern/deviations: Step-to pattern;Decreased step length - right;Decreased step length - left;Trunk flexed Gait velocity: decreased   General Gait Details: cues for sequence and RW position, for THP--avoiding IR L hip  with turns.  Slow required increased time.   Stairs             Wheelchair Mobility    Modified Rankin (Stroke Patients Only)       Balance                                            Cognition Arousal/Alertness: Awake/alert Behavior During Therapy: WFL for tasks assessed/performed Overall Cognitive Status: Within Functional Limits for tasks assessed                                 General Comments: AxO x 3 following all commands and pleasant.      Exercises      General Comments        Pertinent Vitals/Pain Pain Assessment: Faces Faces Pain Scale: Hurts a little bit Pain Location: L hip Pain Descriptors / Indicators: Tightness;Sore Pain Intervention(s): Monitored during session;Premedicated before session;Repositioned;Ice applied    Home Living                      Prior Function            PT Goals (current goals can now be found in the care plan section) Progress towards PT goals: Progressing toward goals    Frequency    7X/week      PT Plan Current plan remains appropriate    Co-evaluation  AM-PAC PT "6 Clicks" Mobility   Outcome Measure  Help needed turning from your back to your side while in a flat bed without using bedrails?: A Lot Help needed moving from lying on your back to sitting on the side of a flat bed without using bedrails?: A Lot Help needed moving to and from a bed to a chair (including a wheelchair)?: A Lot Help needed standing up from a chair using your arms (e.g., wheelchair or bedside chair)?: A Lot Help needed to walk in hospital room?: A Lot Help needed climbing 3-5 steps with a railing? : Total 6 Click Score: 11    End of Session Equipment Utilized During Treatment: Gait belt Activity Tolerance: Patient tolerated treatment well Patient left: in chair;with call bell/phone within reach;with chair alarm set;with family/visitor present Nurse Communication:  Mobility status PT Visit Diagnosis: Difficulty in walking, not elsewhere classified (R26.2)     Time: 4259-5638 PT Time Calculation (min) (ACUTE ONLY): 33 min  Charges:  $Gait Training: 8-22 mins $Therapeutic Activity: 8-22 mins                     Felecia Shelling  PTA Acute  Rehabilitation Services Pager      [phone] Office      [phone]

## 2021-05-22 NOTE — Progress Notes (Signed)
Physical Therapy Treatment Patient Details Name: Marissa Gray MRN: [id number] DOB: [birth year] Today's Date: 05/22/2021    History of Present Illness Patient is a 40 year old woman with Down's Syndrome s/p recent  left posterior THA per Dr Lequita Halt. admitted with surgical site infection now s/p Excisional debridement of skin and subcutaneous tissue left hip per Dr Linna Caprice on 05/19/21.    PT Comments    Pod # 3 l hIP I&D Assisted with amb a greater distance in hallway then back to bed.  Pt progressing well and plans to return to SNF to complete Rehab.  Follow Up Recommendations  SNF     Equipment Recommendations  None recommended by PT    Recommendations for Other Services       Precautions / Restrictions Precautions Precautions: Posterior Hip Precaution Booklet Issued: Yes (comment) Precaution Comments: Pt recalls hip precautions but required cues to avoid breaking  90 degrees hip flexion in bed and in sitting EOB Restrictions Weight Bearing Restrictions: No LLE Weight Bearing: Weight bearing as tolerated Other Position/Activity Restrictions: pt with JP drain and VAC L hip    Mobility  Bed Mobility Overal bed mobility: Needs Assistance Bed Mobility: Supine to Sit     Supine to sit: Min assist     General bed mobility comments: assisted back to bed    Transfers Overall transfer level: Needs assistance Equipment used: Rolling walker (2 wheeled) Transfers: Sit to/from Stand Sit to Stand: Min assist Stand pivot transfers: Min assist       General transfer comment: 25% cues for hand placement and LLE position/THP  Ambulation/Gait Ambulation/Gait assistance: Min guard;Min assist Gait Distance (Feet): 75 Feet Assistive device: Rolling walker (2 wheeled) Gait Pattern/deviations: Step-to pattern;Decreased step length - right;Decreased step length - left;Trunk flexed Gait velocity: decreased   General Gait Details: cues for sequence and RW position, for  THP--avoiding IR L hip with turns.  Slow required increased time.   Stairs             Wheelchair Mobility    Modified Rankin (Stroke Patients Only)       Balance                                            Cognition Arousal/Alertness: Awake/alert Behavior During Therapy: WFL for tasks assessed/performed Overall Cognitive Status: Within Functional Limits for tasks assessed                                 General Comments: AxO x 3 following all commands and pleasant.      Exercises      General Comments        Pertinent Vitals/Pain Pain Assessment: Faces Faces Pain Scale: Hurts a little bit Pain Location: L hip Pain Descriptors / Indicators: Tightness;Sore Pain Intervention(s): Monitored during session;Premedicated before session;Repositioned;Ice applied    Home Living                      Prior Function            PT Goals (current goals can now be found in the care plan section) Progress towards PT goals: Progressing toward goals    Frequency    7X/week      PT Plan Current plan remains appropriate    Co-evaluation  AM-PAC PT "6 Clicks" Mobility   Outcome Measure  Help needed turning from your back to your side while in a flat bed without using bedrails?: A Lot Help needed moving from lying on your back to sitting on the side of a flat bed without using bedrails?: A Lot Help needed moving to and from a bed to a chair (including a wheelchair)?: A Lot Help needed standing up from a chair using your arms (e.g., wheelchair or bedside chair)?: A Lot Help needed to walk in hospital room?: A Lot Help needed climbing 3-5 steps with a railing? : Total 6 Click Score: 11    End of Session Equipment Utilized During Treatment: Gait belt Activity Tolerance: Patient tolerated treatment well Patient left: in bed Nurse Communication: Mobility status PT Visit Diagnosis: Difficulty in walking,  not elsewhere classified (R26.2)     Time: 7096-2836 PT Time Calculation (min) (ACUTE ONLY): 25 min  Charges:  $Gait Training: 8-22 mins $Therapeutic Activity: 8-22 mins         Felecia Shelling  PTA Acute  Rehabilitation Services Pager      [phone] Office      [phone]

## 2021-05-22 NOTE — Progress Notes (Addendum)
Subjective: 3 Days Post-Op Procedure(s) (LRB): IRRIGATION AND DEBRIDEMENT LEFT HIP WITH POSSIBLE HEAD AND LINER EXCHANGE (Left) Patient reports pain as mild.   Patient seen in rounds with Dr. Lequita Halt. Patient is well, and has had no acute complaints or problems. Reports minimal pain. Denies chest pain or SOB. Plan is to go Skilled nursing facility after hospital stay.  Objective: Vital signs in last 24 hours: Temp:  [97.5 F (36.4 C)-98.1 F (36.7 C)] 98 F (36.7 C) (05/26 0524) Pulse Rate:  [65-85] 65 (05/26 0524) Resp:  [16-17] 16 (05/26 0524) BP: (101-116)/(52-63) 101/61 (05/26 0524) SpO2:  [97 %-98 %] 97 % (05/26 0524)  Intake/Output from previous day:  Intake/Output Summary (Last 24 hours) at 05/22/2021 0729 Last data filed at 05/22/2021 0700 Gross per 24 hour  Intake 720 ml  Output 80 ml  Net 640 ml    Intake/Output this shift: No intake/output data recorded.  Labs: Recent Labs    05/19/21 2006 05/20/21 0325 05/21/21 0317 05/22/21 0315  HGB 9.5* 7.4*  7.4* 7.0* 7.4*   Recent Labs    05/21/21 0317 05/22/21 0315  WBC 5.6 6.3  RBC 2.04* 2.15*  HCT 22.0* 24.1*  PLT 399 419*   Recent Labs    05/19/21 2006 05/20/21 0325 05/20/21 0325 05/21/21 0317 05/22/21 0315  NA 140 138  --   --   --   K 4.4 4.4  --   --   --   CL  --  106  --   --   --   CO2  --  25  --   --   --   BUN  --  5*  --   --   --   CREATININE  --  0.52  0.53   < > 0.76 0.63  GLUCOSE  --  114*  --   --   --   CALCIUM  --  8.0*  --   --   --    < > = values in this interval not displayed.   No results for input(s): LABPT, INR in the last 72 hours.  Exam: General - Patient is Alert and Oriented Extremity - Neurologically intact Neurovascular intact Sensation intact distally Dorsiflexion/Plantar flexion intact Dressing/Incision - Wound VAC and JP drain in place. Minimal swelling Motor Function - intact, moving foot and toes well on exam.   Past Medical History:  Diagnosis  Date  . Asthma   . 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 Closed bicondylar fracture of right tibial plateau 06/20/2017  . 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 Dermatitis    left leg   . Down's syndrome   . GERD (gastroesophageal reflux disease)   . Hashimoto's disease   . Heart murmur   . Hypothyroidism   . Pneumonia    hx of years ago   . Rheumatoid arthritis (HCC)   . Strabismus     Assessment/Plan: 3 Days Post-Op Procedure(s) (LRB): IRRIGATION AND DEBRIDEMENT LEFT HIP WITH POSSIBLE HEAD AND LINER EXCHANGE (Left) Principal Problem:   Post-operative infection Active Problems:   Rheumatoid arthritis involving multiple sites (HCC)   Gastroesophageal reflux disease without esophagitis   Moderate asthma without complication   Hypothyroidism  Estimated body mass index is 40.08 kg/m as calculated from the following:   Height as of 04/23/21: 4\' 6"  (1.372 m).   Weight as of 04/23/21: 75.4 kg. Up with therapy  DVT Prophylaxis - Lovenox Weight-bearing as tolerated  Awaiting final  ID recommendations based on susceptibilities. Appreciative of their assistance with Ms. Grunden' care.  Arther Abbott, PA-C Orthopedic Surgery [phone] 05/22/2021, 7:29 AM

## 2021-05-23 LAB — CULTURE, BLOOD (ROUTINE X 2)
Culture: NO GROWTH
Culture: NO GROWTH
Special Requests: ADEQUATE
Special Requests: ADEQUATE

## 2021-05-23 LAB — CREATININE, SERUM
Creatinine, Ser: 0.66 mg/dL (ref 0.44–1.00)
GFR, Estimated: >60 mL/min (ref >60)

## 2021-05-23 LAB — SARS CORONAVIRUS 2 (TAT 6-24 HRS): SARS Coronavirus 2: NEGATIVE

## 2021-05-23 MED ORDER — VITAMIN B-12 1000 MCG PO TABS: 1000.0000 ug | ORAL_TABLET | Freq: Every day | ORAL | 0 refills | Status: DC

## 2021-05-23 MED ORDER — HYDROCODONE-ACETAMINOPHEN 5-325 MG PO TABS: 1.0000 | ORAL_TABLET | Freq: Four times a day (QID) | ORAL | 0 refills | Status: DC | PRN

## 2021-05-23 MED ORDER — DOXYCYCLINE HYCLATE 100 MG PO CAPS: 100.0000 mg | ORAL_CAPSULE | Freq: Two times a day (BID) | ORAL | 0 refills | Status: DC

## 2021-05-23 MED ORDER — METHOCARBAMOL 500 MG PO TABS: 500.0000 mg | ORAL_TABLET | Freq: Four times a day (QID) | ORAL | 0 refills | Status: DC | PRN

## 2021-05-23 NOTE — TOC Transition Note (Signed)
Transition of Care [facility]) - CM/SW Discharge Note  Patient Details  Name: Marissa Gray MRN: [id number] Date of Birth: June 28, 1981  [facility] CM/SW Contact:  Ewing Schlein, LCSW Phone Number: 05/23/2021, 10:47 AM  Clinical Narrative: Patient is medically ready for discharge. FL2 done. CSW confirmed with Clydie Braun at [facility] patient can return today. Discharge summary, COVID results (negative), and FL2 faxed to facility. Medical necessity form done; PTAR scheduled. Discharge packet complete. CSW updated patient and mother. RN updated. TOC signing off.  Final next level of care: Skilled Nursing Facility Barriers to Discharge: Barriers Resolved  Patient Goals and CMS Choice Patient states their goals for this hospitalization and ongoing recovery are:: Discharge to [facility] in [facility]gov Compare Post Acute Care list provided to:: Patient Represenative (must comment) Choice offered to / list presented to : Patient,Parent  Discharge Placement       Patient [facility] bed at: [facility]  Rehabilitation Patient to be transferred to facility by: PTAR Name of family member notified: [name] (mother) Patient and family notified of of transfer: 05/23/21  Discharge Plan and Services In-house Referral: Clinical Social Work Post Acute Care Choice: Skilled Nursing Facility          DME Arranged: N/A DME Agency: NA  Readmission Risk Interventions No flowsheet data found.

## 2021-05-23 NOTE — Progress Notes (Signed)
PROGRESS NOTE    KARAGAN SIRKIN  ZJQ:[id number] DOB: 28-Apr-1981 DOA: 05/17/2021 PCP: Merri Brunette, MD    Chief Complaint  Patient presents with  . Post-op Problem    Brief Narrative:   40 year old female with past medical history of Down syndrome, hypothyroidism, asthma, gastroesophageal reflux disease, rheumatoid arthritis ( MTX recently stopped ) and recent left hip total arthroplasty on 4/27 who presents to [facility] long emergency department as a transfer from [facility] emergency department due to increasing drainage and tenderness at the surgical site. Patient is status post Fluoroscopic guided aspiration of the left hip without complication, 2 mL of bloody fluid acquired on 5/22, IR drain culture no growth s/p irrigation and debridement of the skin and subcutaneous tissue left hip with jp drain placement and wound vac placement on 05/19/2021, surgical drain culture (subcutaneous fluids culture) grew staph aureus  Subjective:   Sitting in bed,  she denies hip pain, she is ready to go to snf + Jp drain ( blood tinged), 50cc documented last 24hrs, + wound vac No fever Mother  at bedside   Assessment & Plan:   Principal Problem:   Post-operative infection Active Problems:   Rheumatoid arthritis involving multiple sites (HCC)   Gastroesophageal reflux disease without esophagitis   Moderate asthma without complication   Hypothyroidism   Postoperative infection, subcutaneous/left hip -Per Ortho there is no synovial joint infection, infection needs subcutaneous, status post JP drain and wound VAC placement on May/23rd, WBAT, lovenox per ortho -Plan for wound vac and drain to be removed at post op visit in six days, in clinic per ortho -Blood culture no growth -MRSA screening positive -Subcutaneous fluids culture grow staph aureus sensitive to doxycycline, has been on vanc and meropenem,  --ID consulted, recommend discharge on doxycycline for 2 weeks  and follow-up with ID in 2 weeks  Blood loss anemia -drain is bloody -no other sign of blood loss,  FOBT negative -Hgb remained above 7, did not require blood transfusion in the hospital  Asthma, stable  GERD continue PPI   Down syndrome  FTT To SNF   Nutrition Status: Nutrition Problem: Increased nutrient needs Etiology: acute illness Signs/Symptoms: estimated needs Interventions: Raechel Chute (each supplement provides 350kcal and 20 grams of protein)  .          DVT prophylaxis: enoxaparin (LOVENOX) injection 40 mg Start: 05/20/21 0800 SCDs Start: 05/19/21 2119   Code Status:full  Family Communication: Mother at bedside  Disposition: Orthopedic discharge patient to SNF, discharge summary done by orthopedic service                Consultants:   IR  ID  ortho  Procedures:   IR status post Fluoroscopic guided aspiration of the left hip on 5/22  Ortho, s/p irrigation and debridement of the skin and subcutaneous tissue left hip with jp drain placement and wound vac placement on 05/19/2021,  Antimicrobials:    Anti-infectives (From admission, onward)   Start     Dose/Rate Route Frequency Ordered Stop   05/23/21 0000  doxycycline (VIBRAMYCIN) 100 MG capsule        100 mg Oral 2 times daily 05/23/21 0737     05/18/21 1800  vancomycin (VANCOREADY) IVPB 750 mg/150 mL        750 mg 150 mL/hr over 60 Minutes Intravenous Every 24 hours 05/18/21 0649     05/18/21 1600  meropenem (MERREM) 1 g in sodium chloride 0.9 % 100 mL IVPB  Status:  Discontinued        1 g 200 mL/hr over 30 Minutes Intravenous Every 8 hours 05/18/21 1509 05/21/21 1306   05/18/21 0145  vancomycin (VANCOCIN) IVPB 1000 mg/200 mL premix  Status:  Discontinued        1,000 mg 200 mL/hr over 60 Minutes Intravenous  Once 05/18/21 0137 05/18/21 0424         Objective: Vitals:   05/22/21 0524 05/22/21 1342 05/22/21 2120 05/23/21 0618  BP: 101/61 (!) 98/47 109/60 112/62  Pulse: 65  83 73 72  Resp: 16 16 16 16   Temp: 98 F (36.7 C) 97.8 F (36.6 C) 98.6 F (37 C) 97.9 F (36.6 C)  TempSrc: Oral Oral Oral Oral  SpO2: 97% 95% 97% 96%    Intake/Output Summary (Last 24 hours) at 05/23/2021 05/25/2021 Last data filed at 05/23/2021 05/25/2021 Gross per 24 hour  Intake --  Output 52 ml  Net -52 ml   There were no vitals filed for this visit.  Examination:  General exam: calm, NAD, aaox3, very pleasant, appear high function  Respiratory system: Clear to auscultation. Respiratory effort normal. Cardiovascular system: S1 & S2 heard, RRR. No JVD, no murmur, No pedal edema. Gastrointestinal system: Abdomen is nondistended, soft and nontender. Normal bowel sounds heard. Central nervous system: Alert and oriented. No focal neurological deficits. Extremities: left hip + jp drain, + wound vac Skin: No rashes, lesions or ulcers Psychiatry: appear high function,  Mood & affect appropriate.     Data Reviewed: I have personally reviewed following labs and imaging studies  CBC: Recent Labs  Lab 05/17/21 2209 05/19/21 0254 05/19/21 2006 05/20/21 0325 05/21/21 0317 05/22/21 0315  WBC 7.2 4.4  --  5.0  5.2 5.6 6.3  NEUTROABS 5.3  --   --   --   --  3.3  HGB 9.7* 8.7* 9.5* 7.4*  7.4* 7.0* 7.4*  HCT 30.5* 27.2* 28.0* 23.3*  23.6* 22.0* 24.1*  MCV 108.2* 109.2*  --  108.4*  108.8* 107.8* 112.1*  PLT 486* 442*  --  379  381 399 419*    Basic Metabolic Panel: Recent Labs  Lab 05/18/21 0352 05/19/21 0254 05/19/21 2006 05/20/21 0325 05/21/21 0317 05/22/21 0315 05/23/21 0325  NA 139 140 140 138  --   --   --   K 4.0 4.0 4.4 4.4  --   --   --   CL 109 109  --  106  --   --   --   CO2 24 25  --  25  --   --   --   GLUCOSE 91 99  --  114*  --   --   --   BUN 9 10  --  5*  --   --   --   CREATININE 0.57 0.70  --  0.52  0.53 0.76 0.63 0.66  CALCIUM 8.1* 7.7*  --  8.0*  --   --   --     GFR: CrCl cannot be calculated (Unknown ideal weight.).  Liver Function  Tests: Recent Labs  Lab 05/18/21 0352 05/19/21 0254  AST 16 20  ALT 18 23  ALKPHOS 203* 187*  BILITOT 0.5 0.3  PROT 6.8 6.3*  ALBUMIN 2.4* 2.3*    CBG: No results for input(s): GLUCAP in the last 168 hours.   Recent Results (from the past 240 hour(s))  SARS CORONAVIRUS 2 (TAT 6-24 HRS) Nasopharyngeal Nasopharyngeal Swab     Status: None  Collection Time: 05/17/21 10:04 PM   Specimen: Nasopharyngeal Swab  Result Value Ref Range Status   SARS Coronavirus 2 NEGATIVE NEGATIVE Final    Comment: (NOTE) SARS-CoV-2 target nucleic acids are NOT DETECTED.  The SARS-CoV-2 RNA is generally detectable in upper and lower respiratory specimens during the acute phase of infection. Negative results do not preclude SARS-CoV-2 infection, do not rule out co-infections with other pathogens, and should not be used as the sole basis for treatment or other patient management decisions. Negative results must be combined with clinical observations, patient history, and epidemiological information. The expected result is Negative.  Fact Sheet for Patients: HairSlick.no  Fact Sheet for Healthcare Providers: quierodirigir.com  This test is not yet approved or cleared by the [place] FDA and  has been authorized for detection and/or diagnosis of SARS-CoV-2 by FDA under an Emergency Use Authorization (EUA). This EUA will remain  in effect (meaning this test can be used) for the duration of the COVID-19 declaration under Se ction 564(b)(1) of the Act, 21 U.S.C. section 360bbb-3(b)(1), unless the authorization is terminated or revoked sooner.  Performed at [facility], [address], [place], [place] [address]   Culture, blood (routine x 2)     Status: None   Collection Time: 05/18/21  7:55 AM   Specimen: BLOOD  Result Value Ref Range Status   Specimen Description   Final    BLOOD RIGHT ANTECUBITAL Performed at [facility], [address], [place], [place] [address]    Special Requests   Final    BOTTLES DRAWN AEROBIC ONLY Blood Culture adequate volume Performed at [facility], [address] [place], [place] [address]    Culture   Final    NO GROWTH 5 DAYS Performed at [facility], [address] [place], [place] [address]    Report Status 05/23/2021 FINAL  Final  Culture, blood (routine x 2)     Status: None   Collection Time: 05/18/21  7:55 AM   Specimen: BLOOD  Result Value Ref Range Status   Specimen Description   Final    BLOOD RIGHT ANTECUBITAL Performed at [facility], [address], [place], [place] [address]    Special Requests   Final    BOTTLES DRAWN AEROBIC ONLY Blood Culture adequate volume Performed at [facility], [address], [place], [place] [address]    Culture   Final    NO GROWTH 5 DAYS Performed at [facility], [address] [place], [place] [address]    Report Status 05/23/2021 FINAL  Final  Body fluid culture w Gram Stain     Status: None   Collection Time: 05/18/21 12:00 PM   Specimen: PATH Cytology Misc. fluid; Synovial Fluid  Result Value Ref Range Status   Specimen Description HIP  Final   Special Requests LEFT HIP  Final   Gram Stain   Final    RARE WBC PRESENT, PREDOMINANTLY MONONUCLEAR NO ORGANISMS SEEN    Culture   Final    NO GROWTH 3 DAYS Performed at [facility], [address], [place], [place] [address]    Report Status 05/22/2021 FINAL  Final  MRSA PCR Screening     Status: Abnormal   Collection Time: 05/18/21 12:30 PM   Specimen: Nasal Mucosa; Nasopharyngeal  Result Value Ref Range Status   MRSA by PCR POSITIVE (A) NEGATIVE Final    Comment:  The GeneXpert MRSA Assay (FDA approved for NASAL specimens only), is one component of a comprehensive MRSA colonization surveillance program. It is not intended to diagnose MRSA infection  nor to guide or monitor treatment for MRSA infections. RESULT CALLED TO, READ BACK BY AND VERIFIED WITH: MILLER,J. RN AT 1436 05/18/21 MULLINS,T Performed at [facility], [address], [place], [place] [address]   Fungus Culture With Stain     Status: None (Preliminary result)   Collection Time: 05/19/21  6:15 PM   Specimen: Synovial, Left Hip; Body Fluid  Result Value Ref Range Status   Fungus Stain Final report  Final    Comment: (NOTE) Performed At: [facility] [address] [place], [place] [id number] Jolene Schimke MD XB:[id number]    Fungus (Mycology) Culture PENDING  Incomplete   Fungal Source SYNOVIAL  Final    Comment: LEFT HIP Performed at [facility], [address] [place], [place] [address]   Aerobic/Anaerobic Culture w Gram Stain (surgical/deep wound)     Status: None (Preliminary result)   Collection Time: 05/19/21  6:15 PM   Specimen: Synovial, Left Hip; Body Fluid  Result Value Ref Range Status   Specimen Description   Final    SYNOVIAL LEFT HIP Performed at [facility], [address], [place], [place] [address]    Special Requests   Final    NONE Performed at [facility], [address], [place], [place] [address]    Gram Stain   Final    RARE WBC PRESENT, PREDOMINANTLY PMN FEW GRAM POSITIVE COCCI IN PAIRS IN CLUSTERS Performed at [facility], [address] [place], [place] [address]    Culture   Final    FEW METHICILLIN RESISTANT STAPHYLOCOCCUS AUREUS NO ANAEROBES ISOLATED; CULTURE IN PROGRESS FOR 5 DAYS    Report Status PENDING  Incomplete   Organism ID, Bacteria METHICILLIN RESISTANT STAPHYLOCOCCUS AUREUS  Final      Susceptibility   Methicillin resistant staphylococcus aureus - MIC*    CIPROFLOXACIN >=8 RESISTANT Resistant     ERYTHROMYCIN >=8 RESISTANT Resistant     GENTAMICIN <=0.5 SENSITIVE Sensitive     OXACILLIN >=4 RESISTANT Resistant      TETRACYCLINE <=1 SENSITIVE Sensitive     VANCOMYCIN 1 SENSITIVE Sensitive     TRIMETH/SULFA >=320 RESISTANT Resistant     CLINDAMYCIN <=0.25 SENSITIVE Sensitive     RIFAMPIN <=0.5 SENSITIVE Sensitive     Inducible Clindamycin NEGATIVE Sensitive     * FEW METHICILLIN RESISTANT STAPHYLOCOCCUS AUREUS  Fungus Culture Result     Status: None   Collection Time: 05/19/21  6:15 PM  Result Value Ref Range Status   Result 1 Comment  Final    Comment: (NOTE) KOH/Calcofluor preparation:  no fungus observed. Performed At: [facility] [address] [place], [place] [id number] Jolene Schimke MD DG:[id number]   SARS CORONAVIRUS 2 (TAT 6-24 HRS) Nasopharyngeal Nasopharyngeal Swab     Status: None   Collection Time: 05/22/21  5:48 PM   Specimen: Nasopharyngeal Swab  Result Value Ref Range Status   SARS Coronavirus 2 NEGATIVE NEGATIVE Final    Comment: (NOTE) SARS-CoV-2 target nucleic acids are NOT DETECTED.  The SARS-CoV-2 RNA is generally detectable in upper and lower respiratory specimens during the acute phase of infection. Negative results do not preclude SARS-CoV-2 infection, do not rule out co-infections with other pathogens, and should not be used as the sole basis for treatment or other patient  management decisions. Negative results must be combined with clinical observations, patient history, and epidemiological information. The expected result is Negative.  Fact Sheet for Patients: HairSlick.no  Fact Sheet for Healthcare Providers: quierodirigir.com  This test is not yet approved or cleared by the [place] FDA and  has been authorized for detection and/or diagnosis of SARS-CoV-2 by FDA under an Emergency Use Authorization (EUA). This EUA will remain  in effect (meaning this test can be used) for the duration of the COVID-19 declaration under Se ction 564(b)(1) of the Act, 21 U.S.C. section 360bbb-3(b)(1), unless  the authorization is terminated or revoked sooner.  Performed at [facility], [address], [place], [place] [address]          Radiology Studies: No results found.      Scheduled Meds: . Chlorhexidine Gluconate Cloth  6 each Topical Q0600  . docusate sodium  100 mg Oral BID  . enoxaparin (LOVENOX) injection  40 mg Subcutaneous Q24H  . folic acid  1 mg Oral Daily  . mupirocin ointment  1 application Nasal BID  . nutrition supplement (JUVEN)  1 packet Oral BID BM  . pantoprazole  40 mg Oral Daily  . senna  1 tablet Oral BID  . sodium chloride  1 spray Nasal BID   Continuous Infusions: . vancomycin 750 mg (05/22/21 1802)     LOS: 6 days   Time spent: Greater than 50% of this time was spent in counseling, explanation of diagnosis, planning of further management, and coordination of care.   Voice Recognition Reubin Milan dictation system was used to create this note, attempts have been made to correct errors. Please contact the author with questions and/or clarifications.   Albertine Grates, MD PhD FACP [facility]  Available via Epic secure chat 7am-7pm for nonurgent issues Please page for urgent issues To page the attending provider between 7A-7P or the covering provider during after hours 7P-7A, please log into the web site [email or web address] and access using universal [place] password for that web site. If you do not have the password, please call the hospital operator.    05/23/2021, 9:06 AM

## 2021-05-23 NOTE — NC FL2 (Signed)
[place] MEDICAID FL2 LEVEL OF CARE SCREENING TOOL     IDENTIFICATION  Patient Name: Marissa Gray Birthdate: 07-11-1981 Sex: female Admission Date (Current Location): 05/17/2021  [place] and [place] Number:  Haynes Bast [id number] K Facility and Address:  [facility],  [address] [place], [place] [address]      Provider Number: [id number]  Attending Physician Name and Address:  Albertine Grates, MD  Relative Name and Phone Number:  [name] (mother) Ph: [phone]    Current Level of Care: Hospital Recommended Level of Care: Skilled Nursing Facility Prior Approval Number:    Date Approved/Denied:   PASRR Number: [id number]  Discharge Plan: SNF    Current Diagnoses: Patient Active Problem List   Diagnosis Date Noted  . Post-operative infection 05/17/2021  . OA (osteoarthritis) of hip 04/23/2021  . Primary osteoarthritis of left hip 04/23/2021  . Hashimoto's thyroiditis 09/12/2017  . Hypothyroidism 08/03/2017  . Nodular thyroid disease 08/03/2017  . Chronic pain of left knee 07/04/2017  . Moderate asthma without complication 06/28/2017  . Hypokalemia 06/28/2017  . Constipation 06/28/2017  . 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 Gastroesophageal reflux disease without esophagitis   . 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 Down's syndrome   . Rheumatoid arthritis involving multiple sites (HCC)   . Closed bicondylar fracture of right tibial plateau 06/20/2017    Orientation RESPIRATION BLADDER Height & Weight     Self,Time,Situation,Place  Normal Continent Weight:   Height:     BEHAVIORAL SYMPTOMS/MOOD NEUROLOGICAL BOWEL NUTRITION STATUS      Continent Diet (Regular diet)  AMBULATORY STATUS COMMUNICATION OF NEEDS Skin   Limited Assist Verbally Other (Comment),Surgical wounds (Rash: left thigh; eczema: right ankle, left foot; blister: left heel)                       Personal Care Assistance Level of Assistance   Bathing,Feeding,Dressing Bathing Assistance: Limited assistance Feeding assistance: Independent Dressing Assistance: Limited assistance     Functional Limitations Info  Sight,Hearing,Speech Sight Info: Adequate Hearing Info: Adequate Speech Info: Adequate    SPECIAL CARE FACTORS FREQUENCY  PT (By licensed PT),OT (By licensed OT)     PT Frequency: 5x's/week OT Frequency: 5x's/week            Contractures Contractures Info: Not present    Additional Factors Info  Code Status,Allergies Code Status Info: Full Allergies Info: Ceclor (Cefaclor), Penicillins, Sulfa Antibiotics           Current Medications (05/23/2021):  This is the current hospital active medication list Current Facility-Administered Medications  Medication Dose Route Frequency Provider Last Rate Last Admin  . acetaminophen (TYLENOL) tablet 650 mg  650 mg Oral Q4H PRN Swinteck, Arlys John, MD      . Chlorhexidine Gluconate Cloth 2 % PADS 6 each  6 each Topical [id number] Albertine Grates, MD   6 each at 05/23/21 [phone]  . docusate sodium (COLACE) capsule 100 mg  100 mg Oral BID Samson Frederic, MD   100 mg at 05/23/21 0915  . enoxaparin (LOVENOX) injection 40 mg  40 mg Subcutaneous Q24H Samson Frederic, MD   40 mg at 05/23/21 0916  . folic acid (FOLVITE) tablet 1 mg  1 mg Oral Daily Swinteck, Arlys John, MD   1 mg at 05/23/21 0914  . loratadine (CLARITIN) tablet 10 mg  10 mg Oral Daily PRN Swinteck, Arlys John, MD      . methocarbamol (ROBAXIN) tablet 500 mg  500 mg Oral Q6H  PRN Samson Frederic, MD   500 mg at 05/23/21 0915  . metoCLOPramide (REGLAN) tablet 5-10 mg  5-10 mg Oral Q8H PRN Swinteck, Arlys John, MD       Or  . metoCLOPramide (REGLAN) injection 5-10 mg  5-10 mg Intravenous Q8H PRN Swinteck, Arlys John, MD      . oxyCODONE-acetaminophen (PERCOCET/ROXICET) 5-325 MG per tablet 1 tablet  1 tablet Oral Q4H PRN Samson Frederic, MD   1 tablet at 05/23/21 0915   Or  . morphine 2 MG/ML injection 2 mg  2 mg Intravenous Q4H PRN Swinteck, Arlys John,  MD      . mupirocin ointment (BACTROBAN) 2 % 1 application  1 application Nasal BID Albertine Grates, MD   1 application at 05/22/21 2236  . nutrition supplement (JUVEN) (JUVEN) powder packet 1 packet  1 packet Oral BID BM Alwyn Ren, MD   1 packet at 05/23/21 [phone]  . ondansetron (ZOFRAN) tablet 4 mg  4 mg Oral Q6H PRN Swinteck, Arlys John, MD       Or  . ondansetron (ZOFRAN) injection 4 mg  4 mg Intravenous Q6H PRN Swinteck, Arlys John, MD      . pantoprazole (PROTONIX) EC tablet 40 mg  40 mg Oral Daily Samson Frederic, MD   40 mg at 05/23/21 0915  . polyethylene glycol (MIRALAX / GLYCOLAX) packet 17 g  17 g Oral Daily PRN Swinteck, Arlys John, MD      . senna (SENOKOT) tablet 8.6 mg  1 tablet Oral BID Samson Frederic, MD   8.6 mg at 05/23/21 0915  . sodium chloride (OCEAN) 0.65 % nasal spray 1 spray  1 spray Nasal BID Samson Frederic, MD   1 spray at 05/23/21 [phone]  . vancomycin (VANCOREADY) IVPB 750 mg/150 mL  750 mg Intravenous Q24H Samson Frederic, MD 150 mL/hr at 05/22/21 1802 750 mg at 05/22/21 1802     Discharge Medications: Please see discharge summary for a list of discharge medications.  Relevant Imaging Results:  Relevant Lab Results:   Additional Information SSN: [id number]  Ewing Schlein, LCSW

## 2021-05-23 NOTE — Discharge Summary (Addendum)
Physician Discharge Summary   Patient ID: Marissa Gray MRN: [id number] DOB/AGE: 1981/08/20 40 y.o.  Admit date: 05/17/2021 Discharge date: 05/23/21  Primary Diagnosis: s/p Left THA; s/p Irrigation and debridement left hip   Admission Diagnoses:  Past Medical History:  Diagnosis Date  . Asthma   . 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 Closed bicondylar fracture of right tibial plateau 06/20/2017  . 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 Dermatitis    left leg   . Down's syndrome   . GERD (gastroesophageal reflux disease)   . Hashimoto's disease   . Heart murmur   . Hypothyroidism   . Pneumonia    hx of years ago   . Rheumatoid arthritis (HCC)   . Strabismus    Discharge Diagnoses:   Principal Problem:   Post-operative infection Active Problems:   Rheumatoid arthritis involving multiple sites (HCC)   Gastroesophageal reflux disease without esophagitis   Moderate asthma without complication   Hypothyroidism  Estimated body mass index is 40.08 kg/m as calculated from the following:   Height as of 04/23/21: 4\' 6"  (1.372 m).   Weight as of 04/23/21: 75.4 kg.  Procedure:  Procedure(s) (LRB): IRRIGATION AND DEBRIDEMENT LEFT HIP WITH POSSIBLE HEAD AND LINER EXCHANGE (Left)   Consults: ID  HPI: Marissa Gray is a 40 y.o. female who underwent primary left total hip arthroplasty posterior approach by Dr. 40 on 04/23/2021.  She was discharged to a SNF and has been taking Xarelto for DVT prophylaxis.  She developed serous wound drainage on May 8 which resolved.  She began having drainage again on 05/15/2021 which significantly worsened over the next couple of days.  She was brought to the emergency department at an outside hospital and then transferred to [facility].  She was seen in the emergency department by the hospitalist and admitted.  She was started on IV vancomycin and meropenem.  Interventional radiology was consulted for fluoroscopic  guided aspiration of the left hip.  Synovial fluid analysis was consistent with normal postoperative fluid per the Musculoskeletal Infection Society protocol.  She was then indicated for I&D of the left hip, surgical exploration, and possible head ball and liner exchange.  The risk, benefits, and alternatives were discussed with her mother, and she elected to proceed.  Laboratory Data: No results displayed because visit has over 200 results.    Admission on 04/23/2021, Discharged on 05/02/2021  Component Date Value Ref Range Status  . Preg Test, Ur 04/23/2021 NEGATIVE  NEGATIVE Final   Comment:        THE SENSITIVITY OF THIS METHODOLOGY IS >20 mIU/mL. Performed at [facility], [address], [place], [place]   . WBC 04/24/2021 9.0  4.0 - 10.5 K/uL Final  . RBC 04/24/2021 3.26* 3.87 - 5.11 MIL/uL Final  . Hemoglobin 04/24/2021 11.9* 12.0 - 15.0 g/dL Final  . HCT 04/26/2021 35.6* 36.0 - 46.0 % Final  . MCV 04/24/2021 109.2* 80.0 - 100.0 fL Final  . MCH 04/24/2021 36.5* 26.0 - 34.0 pg Final  . MCHC 04/24/2021 33.4  30.0 - 36.0 g/dL Final  . RDW 04/26/2021 13.5  11.5 - 15.5 % Final  . Platelets 04/24/2021 304  150 - 400 K/uL Final  . nRBC 04/24/2021 0.0  0.0 - 0.2 % Final   Performed at [facility], [address], [place], [place]  . Sodium 04/24/2021 136  135 - 145 mmol/L Final  . Potassium 04/24/2021  4.3  3.5 - 5.1 mmol/L Final  . Chloride 04/24/2021 107  98 - 111 mmol/L Final  . CO2 04/24/2021 21* 22 - 32 mmol/L Final  . Glucose, Bld 04/24/2021 117* 70 - 99 mg/dL Final   Glucose reference range applies only to samples taken after fasting for at least 8 hours.  . BUN 04/24/2021 11  6 - 20 mg/dL Final  . Creatinine, Ser 04/24/2021 0.67  0.44 - 1.00 mg/dL Final  . Calcium 16/09/9603 8.0* 8.9 - 10.3 mg/dL Final  . GFR, Estimated 04/24/2021 >60  >60 mL/min Final   Comment: (NOTE) Calculated using the CKD-EPI Creatinine  Equation (2021)   . Anion gap 04/24/2021 8  5 - 15 Final   Performed at [facility], [address] [place], [place] [address]  . WBC 04/25/2021 10.3  4.0 - 10.5 K/uL Final  . RBC 04/25/2021 2.93* 3.87 - 5.11 MIL/uL Final  . Hemoglobin 04/25/2021 10.7* 12.0 - 15.0 g/dL Final  . HCT 11/91/4782 32.3* 36.0 - 46.0 % Final  . MCV 04/25/2021 110.2* 80.0 - 100.0 fL Final  . MCH 04/25/2021 36.5* 26.0 - 34.0 pg Final  . MCHC 04/25/2021 33.1  30.0 - 36.0 g/dL Final  . RDW 95/62/1308 13.9  11.5 - 15.5 % Final  . Platelets 04/25/2021 288  150 - 400 K/uL Final  . nRBC 04/25/2021 0.0  0.0 - 0.2 % Final   Performed at [facility], [address] [place], [place] [address]  . Sodium 04/25/2021 139  135 - 145 mmol/L Final  . Potassium 04/25/2021 4.0  3.5 - 5.1 mmol/L Final  . Chloride 04/25/2021 109  98 - 111 mmol/L Final  . CO2 04/25/2021 23  22 - 32 mmol/L Final  . Glucose, Bld 04/25/2021 110* 70 - 99 mg/dL Final   Glucose reference range applies only to samples taken after fasting for at least 8 hours.  . BUN 04/25/2021 11  6 - 20 mg/dL Final  . Creatinine, Ser 04/25/2021 0.52  0.44 - 1.00 mg/dL Final  . Calcium 69/62/9528 8.1* 8.9 - 10.3 mg/dL Final  . GFR, Estimated 04/25/2021 >60  >60 mL/min Final   Comment: (NOTE) Calculated using the CKD-EPI Creatinine Equation (2021)   . Anion gap 04/25/2021 7  5 - 15 Final   Performed at [facility], [address] [place], [place] [address]  . WBC 04/26/2021 8.0  4.0 - 10.5 K/uL Final  . RBC 04/26/2021 2.92* 3.87 - 5.11 MIL/uL Final  . Hemoglobin 04/26/2021 10.7* 12.0 - 15.0 g/dL Final  . HCT 40/09/2724 32.8* 36.0 - 46.0 % Final  . MCV 04/26/2021 112.3* 80.0 - 100.0 fL Final  . MCH 04/26/2021 36.6* 26.0 - 34.0 pg Final  . MCHC 04/26/2021 32.6  30.0 - 36.0 g/dL Final  . RDW 36/64/4034 13.9  11.5 - 15.5 % Final  . Platelets 04/26/2021 306  150 - 400 K/uL Final  . nRBC 04/26/2021 0.0  0.0 -  0.2 % Final   Performed at [facility], [address] [place], [place] [address]  . SARS Coronavirus 2 05/01/2021 NEGATIVE  NEGATIVE Final   Comment: (NOTE) SARS-CoV-2 target nucleic acids are NOT DETECTED.  The SARS-CoV-2 RNA is generally detectable in upper and lower respiratory specimens during the acute phase of infection. Negative results do not preclude SARS-CoV-2 infection, do not rule out co-infections with other pathogens, and should not be used as the sole basis for treatment or other  patient management decisions. Negative results must be combined with clinical observations, patient history, and epidemiological information. The expected result is Negative.  Fact Sheet for Patients: HairSlick.no  Fact Sheet for Healthcare Providers: quierodirigir.com  This test is not yet approved or cleared by the [place] FDA and  has been authorized for detection and/or diagnosis of SARS-CoV-2 by FDA under an Emergency Use Authorization (EUA). This EUA will remain  in effect (meaning this test can be used) for the duration of the COVID-19 declaration under Se                          ction 564(b)(1) of the Act, 21 U.S.C. section 360bbb-3(b)(1), unless the authorization is terminated or revoked sooner.  Performed at [facility], [address], [place], [place] [address]   Hospital Outpatient Visit on 04/21/2021  Component Date Value Ref Range Status  . SARS Coronavirus 2 04/21/2021 NEGATIVE  NEGATIVE Final   Comment: (NOTE) SARS-CoV-2 target nucleic acids are NOT DETECTED.  The SARS-CoV-2 RNA is generally detectable in upper and lower respiratory specimens during the acute phase of infection. Negative results do not preclude SARS-CoV-2 infection, do not rule out co-infections with other pathogens, and should not be used as the sole basis for treatment or other patient management  decisions. Negative results must be combined with clinical observations, patient history, and epidemiological information. The expected result is Negative.  Fact Sheet for Patients: HairSlick.no  Fact Sheet for Healthcare Providers: quierodirigir.com  This test is not yet approved or cleared by the [place] FDA and  has been authorized for detection and/or diagnosis of SARS-CoV-2 by FDA under an Emergency Use Authorization (EUA). This EUA will remain  in effect (meaning this test can be used) for the duration of the COVID-19 declaration under Se                          ction 564(b)(1) of the Act, 21 U.S.C. section 360bbb-3(b)(1), unless the authorization is terminated or revoked sooner.  Performed at [facility], [address], [place], [place] [address]   Hospital Outpatient Visit on 04/14/2021  Component Date Value Ref Range Status  . MRSA, PCR 04/14/2021 NEGATIVE  NEGATIVE Final  . Staphylococcus aureus 04/14/2021 NEGATIVE  NEGATIVE Final   Comment: (NOTE) The Xpert SA Assay (FDA approved for NASAL specimens in patients 43 years of age and older), is one component of a comprehensive surveillance program. It is not intended to diagnose infection nor to guide or monitor treatment. Performed at [facility], [address] [place], [place] [address]   . WBC 04/14/2021 5.4  4.0 - 10.5 K/uL Final  . RBC 04/14/2021 3.67* 3.87 - 5.11 MIL/uL Final  . Hemoglobin 04/14/2021 13.6  12.0 - 15.0 g/dL Final  . HCT 01/00/7121 40.3  36.0 - 46.0 % Final  . MCV 04/14/2021 109.8* 80.0 - 100.0 fL Final  . MCH 04/14/2021 37.1* 26.0 - 34.0 pg Final  . MCHC 04/14/2021 33.7  30.0 - 36.0 g/dL Final  . RDW 97/58/8325 14.3  11.5 - 15.5 % Final  . Platelets 04/14/2021 395  150 - 400 K/uL Final  . nRBC 04/14/2021 0.0  0.0 - 0.2 % Final   Performed at [facility], [address]  [place], [place] [address]  . Sodium 04/14/2021 143  135 - 145 mmol/L Final  . Potassium 04/14/2021 4.4  3.5 - 5.1 mmol/L Final  . Chloride 04/14/2021 110  98 - 111 mmol/L Final  . CO2 04/14/2021 22  22 - 32 mmol/L Final  . Glucose, Bld 04/14/2021 85  70 - 99 mg/dL Final   Glucose reference range applies only to samples taken after fasting for at least 8 hours.  . BUN 04/14/2021 18  6 - 20 mg/dL Final  . Creatinine, Ser 04/14/2021 0.70  0.44 - 1.00 mg/dL Final  . Calcium 16/09/9603 9.0  8.9 - 10.3 mg/dL Final  . Total Protein 04/14/2021 8.0  6.5 - 8.1 g/dL Final  . Albumin 54/08/8118 3.6  3.5 - 5.0 g/dL Final  . AST 14/78/2956 21  15 - 41 U/L Final  . ALT 04/14/2021 18  0 - 44 U/L Final  . Alkaline Phosphatase 04/14/2021 73  38 - 126 U/L Final  . Total Bilirubin 04/14/2021 0.4  0.3 - 1.2 mg/dL Final  . GFR, Estimated 04/14/2021 >60  >60 mL/min Final   Comment: (NOTE) Calculated using the CKD-EPI Creatinine Equation (2021)   . Anion gap 04/14/2021 11  5 - 15 Final   Performed at [facility], [address] [place], [place] [address]  . Prothrombin Time 04/14/2021 13.3  11.4 - 15.2 seconds Final  . INR 04/14/2021 1.0  0.8 - 1.2 Final   Comment: (NOTE) INR goal varies based on device and disease states. Performed at [facility], [address], [place], [place] [address]   . aPTT 04/14/2021 29  24 - 36 seconds Final   Performed at [facility], [address] [place], [place] [address]  . ABO/RH(D) 04/14/2021 O POS   Final  . Antibody Screen 04/14/2021 NEG   Final  . Sample Expiration 04/14/2021 04/26/2021,2359   Final  . Extend sample reason 04/14/2021    Final                   Value:NO TRANSFUSIONS OR PREGNANCY IN THE PAST 3 MONTHS Performed at [facility], [address] [place], [place] [address]      X-Rays:DG Pelvis Portable  Result Date: 04/23/2021 CLINICAL DATA:  Left hip arthroplasty. EXAM:  PORTABLE PELVIS 1-2 VIEWS COMPARISON:  None. FINDINGS: Left hip arthroplasty in expected alignment. No periprosthetic lucency or fracture. Recent postsurgical change includes air and edema in the soft tissues. IMPRESSION: Left hip arthroplasty without immediate postoperative complication. Electronically Signed   By: Narda Rutherford M.D.   On: 04/23/2021 19:02   DG FLUORO GUIDED NEEDLE PLC ASPIRATION/INJECTION LOC  Result Date: 05/18/2021 CLINICAL DATA:  Patient with drainage from the lateral left hip. Patient had a total left hip arthroplasty performed on 04/23/2021. 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 EXAM: LEFT HIP INJECTION UNDER FLUOROSCOPY COMPARISON:  Current left hip radiographs. FLUOROSCOPY TIME:  Fluoroscopy Time:  0 minutes and 48 seconds. Radiation Exposure Index (if provided by the fluoroscopic device): 12.3 mGy Number of Acquired Spot Images: 1 PROCEDURE: Overlying skin prepped with Betadine, draped in the usual sterile fashion, and infiltrated locally with buffered Lidocaine. A 20 gauge spinal needle advanced to the junction of the left femoral prosthesis head and neck. 1 ml of Lidocaine injected easily. The hip was then aspirated with a total of 2 mL of bloody fluid acquired. No additional fluid could be aspirated. Fluid was sent for laboratory analysis. IMPRESSION: Fluoroscopic guided aspiration of the left hip without complication. 2 mL of bloody fluid acquired. Electronically Signed  By: Amie Portland M.D.   On: 05/18/2021 12:40   DG HIP PORT UNILAT WITH PELVIS 1V LEFT  Result Date: 05/18/2021 CLINICAL DATA:  Status post total hip replacement. EXAM: DG HIP (WITH OR WITHOUT PELVIS) 1V PORT LEFT COMPARISON:  Pelvic radiograph 04/23/2021 FINDINGS: Left hip arthroplasty in expected alignment no periprosthetic lucency or fracture. Some developing heterotopic calcification is noted adjacent to the greater trochanter and lateral acetabulum. Previous postsurgical soft tissue  air and edema has resolved. Remainder of the bony pelvis is intact. IMPRESSION: 1. Left hip arthroplasty in expected alignment. No periprosthetic lucency or fracture. 2. Developing heterotopic calcification adjacent to the greater trochanter and lateral acetabulum. Electronically Signed   By: Narda Rutherford M.D.   On: 05/18/2021 01:06    EKG: Orders placed or performed during the hospital encounter of 05/17/21  . EKG 12-Lead  . EKG 12-Lead  . EKG     Hospital Course: Marissa Gray is a 40 y.o. who was admitted to [facility]. They were brought to the operating room on 05/19/2021 and underwent Procedure(s): IRRIGATION AND DEBRIDEMENT LEFT HIP WITH POSSIBLE HEAD AND LINER EXCHANGE.  Patient tolerated the procedure well and was later transferred to the recovery room and then to the orthopaedic floor for postoperative care. They were given PO and IV analgesics for pain control following their surgery. They were given 24 hours of postoperative antibiotics of  Anti-infectives (From admission, onward)   Start     Dose/Rate Route Frequency Ordered Stop   05/23/21 0000  doxycycline (VIBRAMYCIN) 100 MG capsule        100 mg Oral 2 times daily 05/23/21 0737     05/18/21 1800  vancomycin (VANCOREADY) IVPB 750 mg/150 mL        750 mg 150 mL/hr over 60 Minutes Intravenous Every 24 hours 05/18/21 0649     05/18/21 1600  meropenem (MERREM) 1 g in sodium chloride 0.9 % 100 mL IVPB  Status:  Discontinued        1 g 200 mL/hr over 30 Minutes Intravenous Every 8 hours 05/18/21 1509 05/21/21 1306   05/18/21 0145  vancomycin (VANCOCIN) IVPB 1000 mg/200 mL premix  Status:  Discontinued        1,000 mg 200 mL/hr over 60 Minutes Intravenous  Once 05/18/21 0137 05/18/21 0424     and started on DVT prophylaxis in the form of Xarelto.   PT and OT were ordered for total joint protocol. Discharge planning consulted to help with postop disposition and equipment needs. Patient had an uneventful night on the  evening of surgery. They started to get up OOB with therapy on 05/20/21. Continued to work with therapy into POD #2. Pt was seen during rounds on day two and was ready to go home pending progress with therapy. Wound vac and JP drain in placed, and will plan for them to be pulled in clinic. She was discharged to Skilled Nursing Facility later that day in stable condition.  Diet: Regular diet Activity: WBAT Follow-up: in one week Disposition: Skilled nursing facility Discharged Condition: good    Allergies as of 05/23/2021      Reactions   Cefaclor Hives, Rash   Other reaction(s): Unknown   Penicillins Hives   Has patient had a PCN reaction causing immediate rash, facial/tongue/throat swelling, SOB or lightheadedness with hypotension: Unknown Has patient had a PCN reaction causing severe rash involving mucus membranes or skin necrosis: Unknown Has patient had a PCN reaction that required hospitalization:  Unknown Has patient had a PCN reaction occurring within the last 10 years: Unknown If all of the above answers are "NO", then may proceed with Cephalosporin use. Other reaction(s): rash, Unknown   Sulfa Antibiotics Hives, Other (See Comments)   FEVER Other reaction(s): Unknown      Medication List    TAKE these medications   aspirin 81 MG chewable tablet Chew 81 mg by mouth daily. For heart health for 3 weeks   clotrimazole-betamethasone cream Commonly known as: LOTRISONE Apply 1 application topically 2 (two) times daily.   doxycycline 100 MG capsule Commonly known as: Vibramycin Take 1 capsule (100 mg total) by mouth 2 (two) times daily.   esomeprazole 40 MG capsule Commonly known as: NEXIUM Take 40 mg by mouth in the morning. (0700)   folic acid 1 MG tablet Commonly known as: FOLVITE Take 1 mg by mouth in the morning. (0700)   HYDROcodone-acetaminophen 5-325 MG tablet Commonly known as: NORCO/VICODIN Take 1-2 tablets by mouth every 6 (six) hours as needed for severe  pain.   levonorgestrel-ethinyl estradiol 0.1-20 MG-MCG tablet Commonly known as: ALESSE Take 1 tablet by mouth in the morning. (0700)   loratadine 10 MG tablet Commonly known as: CLARITIN Take 10 mg by mouth daily as needed for allergies.   methocarbamol 500 MG tablet Commonly known as: Robaxin Take 1 tablet (500 mg total) by mouth every 6 (six) hours as needed for muscle spasms.   OcuSoft Eyelid Cleansing Pads Place 1 application into both eyes in the morning and at bedtime. Apply to Eyelid and eyelash topically two times a day for Dry eyes   potassium chloride 10 MEQ CR capsule Commonly known as: MICRO-K Take 10 mEq by mouth 2 (two) times daily. (0700 & 2000)   rivaroxaban 10 MG Tabs tablet Commonly known as: XARELTO Take 1 tablet (10 mg total) by mouth daily with breakfast.   sodium chloride 0.65 % nasal spray Commonly known as: OCEAN Place 1 spray into the nose 2 (two) times daily. (0700 & 2000)       Follow-up Information    Samson Frederic, MD On 05/30/2021.   Specialty: Orthopedic Surgery Contact information: [address] [place] [address] [place] [address] [phone]        Raymondo Band, MD Follow up in 2 week(s).   Specialty: Infectious Diseases Contact information: [address] [place] [address] [place] [address] [phone]               Signed: Nelia Shi, PA-C Orthopedic Surgery 05/23/2021, 7:38 AM

## 2021-05-23 NOTE — Progress Notes (Signed)
   Subjective: 4 Days Post-Op Procedure(s) (LRB): IRRIGATION AND DEBRIDEMENT LEFT HIP WITH POSSIBLE HEAD AND LINER EXCHANGE (Left) Patient reports pain as mild.   Patient seen in rounds by Dr. Lequita Halt. Patient is well, and has had no acute complaints or problems. No acute overnight events. Denies SOB, chest pain, or calf pain.  We will continue therapy today.   Objective: Vital signs in last 24 hours: Temp:  [97.8 F (36.6 C)-98.6 F (37 C)] 97.9 F (36.6 C) (05/27 0618) Pulse Rate:  [72-83] 72 (05/27 0618) Resp:  [16] 16 (05/27 0618) BP: (98-112)/(47-62) 112/62 (05/27 0618) SpO2:  [95 %-97 %] 96 % (05/27 0618)  Intake/Output from previous day:  Intake/Output Summary (Last 24 hours) at 05/23/2021 0725 Last data filed at 05/23/2021 0618 Gross per 24 hour  Intake 240 ml  Output 52 ml  Net 188 ml     Intake/Output this shift: No intake/output data recorded.  Labs: Recent Labs    05/21/21 0317 05/22/21 0315  HGB 7.0* 7.4*   Recent Labs    05/21/21 0317 05/22/21 0315  WBC 5.6 6.3  RBC 2.04* 2.15*  HCT 22.0* 24.1*  PLT 399 419*   Recent Labs    05/22/21 0315 05/23/21 0325  CREATININE 0.63 0.66   No results for input(s): LABPT, INR in the last 72 hours.  Exam: General - Patient is Alert and Oriented Extremity - Neurologically intact Neurovascular intact Intact pulses distally Dorsiflexion/Plantar flexion intact Dressing - Wound VAC and JP drain in place. Minimal swelling.  Motor Function - intact, moving foot and toes well on exam.   Past Medical History:  Diagnosis Date  . Asthma   . 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 Closed bicondylar fracture of right tibial plateau 06/20/2017  . 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 Dermatitis    left leg   . Down's syndrome   . GERD (gastroesophageal reflux disease)   . Hashimoto's disease   . Heart murmur   . Hypothyroidism   . Pneumonia    hx of years ago   . Rheumatoid  arthritis (HCC)   . Strabismus     Assessment/Plan: 4 Days Post-Op Procedure(s) (LRB): IRRIGATION AND DEBRIDEMENT LEFT HIP WITH POSSIBLE HEAD AND LINER EXCHANGE (Left) Principal Problem:   Post-operative infection Active Problems:   Rheumatoid arthritis involving multiple sites (HCC)   Gastroesophageal reflux disease without esophagitis   Moderate asthma without complication   Hypothyroidism  Estimated body mass index is 40.08 kg/m as calculated from the following:   Height as of 04/23/21: 4\' 6"  (1.372 m).   Weight as of 04/23/21: 75.4 kg. Up with therapy  DVT Prophylaxis - Lovenox Weight bearing as tolerated D/C knee immobilizer Hemovac pulled without difficulty Begin therapy Hip precautions discussed with patient  Plan is to go Skilled nursing facility after hospital stay.  Infectious Disease recommending Doxycycline, 100mg  twice a day for two more weeks - appreciative of their recommendation.   Patient to follow up in clinic with Dr. 04/25/21 next week to have the drain pulled.   Pending availability, patient will be able to be discharged today to SNF.   , MBA, PA-C Orthopedic Surgery 05/23/2021, 7:25 AM

## 2021-05-24 LAB — AEROBIC/ANAEROBIC CULTURE W GRAM STAIN (SURGICAL/DEEP WOUND)

## 2021-05-27 ENCOUNTER — Telehealth: Payer: Self-pay

## 2021-05-27 NOTE — Telephone Encounter (Signed)
Received call from skilled nursing facility Director requesting end date for medication Doxycycline. Advised nursing facility that end date will be determined at office visit. Per ID hospital notes patient to continue medication for 2 more week from 5/26 -6/9. Also advised facility to send lab to triage fax for MD to review. (CBC, BMP) Valarie Cones

## 2021-06-05 ENCOUNTER — Ambulatory Visit (INDEPENDENT_AMBULATORY_CARE_PROVIDER_SITE_OTHER): Payer: Medicaid Other | Admitting: Internal Medicine

## 2021-06-05 ENCOUNTER — Other Ambulatory Visit: Payer: Self-pay

## 2021-06-05 ENCOUNTER — Encounter: Payer: Self-pay | Admitting: Internal Medicine

## 2021-06-05 VITALS — BP 105/73 | HR 84 | Resp 16

## 2021-06-05 DIAGNOSIS — T8149XA Infection following a procedure, other surgical site, initial encounter: Secondary | ICD-10-CM

## 2021-06-05 NOTE — Patient Instructions (Signed)
Finish doxycycline 6/10  See ortho  Please have your rehab center do blood tests for cbc, cmp, crp in July and see me a week later

## 2021-06-05 NOTE — Progress Notes (Signed)
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   MRN: [id number]  Chief Complaint  Patient presents with   Hospitalization Follow-up    HPI:  Marissa Gray is a 40 y.o. female down syndrome, s/p left hip replacement for OA on 4/27, complicated by early superficial wound infection s/p debridement 5/23 cx showing mrsa on oral abx here for f/u   I reviewed chart note by Dr Luciana Axe and orthopedics from her may admission I reviewed the op note. There was no wound dehiscence, just redness/seropurulent discharge, pocket of purulence in sq tissue, but the underlying fascia was "water tight" and no concern for deep space infection or hardware involvement. Ir aspirated the hip joint and cx was negative and only 2k wbc  Dr Luciana Axe rx'ed her 2 week doxycycline  Patient is doin gmuch better. No swelling/discharge/pain in the left hip She'll see ortho tomorrow  No n/v/diarrhea   Allergies  Allergen Reactions   Cefaclor Hives and Rash    Other reaction(s): Unknown   Penicillins Hives     Has patient had a PCN reaction causing immediate rash, facial/tongue/throat swelling, SOB or  lightheadedness with hypotension: Unknown Has patient had a PCN reaction causing severe rash involving mucus membranes or skin necrosis: Unknown Has patient had a PCN reaction that required hospitalization: Unknown Has patient had a PCN reaction occurring within the last 10 years: Unknown If all of the above answers are "NO", then may proceed with Cephalosporin use. Other reaction(s): rash, Unknown   Sulfa Antibiotics Hives and Other (See Comments)    FEVER Other reaction(s): Unknown      Outpatient Medications Prior to Visit  Medication Sig Dispense Refill   aspirin 81 MG chewable tablet Chew 81 mg by mouth daily. For heart health for 3 weeks     clotrimazole-betamethasone (LOTRISONE) cream Apply 1 application topically 2 (two) times daily.     doxycycline (VIBRAMYCIN) 100 MG capsule Take 1 capsule (100 mg total) by mouth 2 (two) times daily. 28 capsule 0   Eyelid Cleansers (OCUSOFT EYELID CLEANSING) PADS Place 1 application into both eyes in the morning and at bedtime. Apply to Eyelid and eyelash topically two times a day for Dry eyes     folic acid (FOLVITE) 1 MG tablet Take 1 mg by mouth in the morning. (0700)     HYDROcodone-acetaminophen (NORCO/VICODIN) 5-325 MG tablet Take 1-2 tablets by mouth every 6 (six) hours as needed for severe pain. 20 tablet 0   levonorgestrel-ethinyl estradiol (ALESSE) 0.1-20 MG-MCG tablet Take 1 tablet by mouth in the morning. (0700)     loratadine (CLARITIN) 10 MG tablet Take 10 mg by mouth daily as needed for allergies.  methocarbamol (ROBAXIN) 500 MG tablet Take 1 tablet (500 mg total) by mouth every 6 (six) hours as needed for muscle spasms. 40 tablet 0   pantoprazole (PROTONIX) 40 MG tablet Take 40 mg by mouth daily.     potassium chloride (MICRO-K) 10 MEQ CR capsule Take 10 mEq by mouth 2 (two) times daily. (0700 & 2000)     sodium chloride (OCEAN) 0.65 % nasal spray Place 1 spray into the nose 2 (two) times daily. (0700 & 2000)     esomeprazole  (NEXIUM) 40 MG capsule Take 40 mg by mouth in the morning. (0700) (Patient not taking: Reported on 06/05/2021)     Eyelid Cleansers (OCUSOFT EYELID CLEANSING) PADS See admin instructions.     potassium chloride (KLOR-CON) 10 MEQ tablet 1 tablet with food     rivaroxaban (XARELTO) 10 MG TABS tablet Take 1 tablet (10 mg total) by mouth daily with breakfast. (Patient not taking: Reported on 06/05/2021) 20 tablet 0   vitamin B-12 (CYANOCOBALAMIN) 1000 MCG tablet Take 1 tablet (1,000 mcg total) by mouth daily. (Patient not taking: Reported on 06/05/2021) 30 tablet 0   No facility-administered medications prior to visit.     Social History   Socioeconomic History   Marital status: Single    Spouse name: Not on file   Number of children: Not on file   Years of education: Not on file   Highest education level: Not on file  Occupational History   Not on file  Tobacco Use   Smoking status: Never   Smokeless tobacco: Never  Vaping Use   Vaping Use: Never used  Substance and Sexual Activity   Alcohol use: No   Drug use: No   Sexual activity: Not on file  Other Topics Concern   Not on file  Social History Narrative   Not on file   Social Determinants of Health   Financial Resource Strain: Not on file  Food Insecurity: Not on file  Transportation Needs: Not on file  Physical Activity: Not on file  Stress: Not on file  Social Connections: Not on file  Intimate Partner Violence: Not on file      Review of Systems     Objective:    BP 105/73   Pulse 84   Resp 16   LMP 05/29/2021   SpO2 99%  Nursing note and vital signs reviewed.  Physical Exam    General/constitutional: no distress, pleasant; down syndrome features  HEENT: Normocephalic, PER, Conj Clear, EOMI, Oropharynx clear Neck supple CV: rrr no mrg Lungs: clear to auscultation, normal respiratory effort Abd: Soft, Nontender Ext: no edema Skin: No Rash Neuro: nonfocal MSK: no peripheral joint  swelling/tenderness/warmth; back spines nontender -- incision left hip all intact no dehiscence, the suture intact, no swelling/redness/tenderness     Labs: 6/03 cbc 5/8/544;  5/31 cr 0.8   Crp: 5/26    5.1 5/22    22.5   Micro:  Serology:  Imaging:  Assessment & Plan:   Problem List Items Addressed This Visit   None Visit Diagnoses     Surgical site infection    -  Primary       Patient doing well Reviewed op note, seems all superficial, but difficult to say sometimes.   The wound today looks healed and there is no sign of inflammation She has 1 more day of doxycycline  Will need to check her inflammation markers and evaluate 2-4 weeks after she finish antibiotics to see how she  does   -finish doxycycline 6/10 -f/u ortho as discussed with them -snf to do cbc, cmp, and crp first week of july -follow up with me sometimes in July or august    Follow-up: Return in about 8 weeks (around 07/31/2021).   I have spent a total of 20 minutes of face-to-face and non-face-to-face time, excluding clinical staff time, preparing to see patient, ordering tests and/or medications, and provide counseling the patient    Raymondo Band, MD [facility] [phone]  pager   [phone] cell 06/05/2021, 4:39 PM

## 2021-06-17 LAB — FUNGUS CULTURE WITH STAIN

## 2021-06-17 LAB — FUNGUS CULTURE RESULT

## 2021-06-17 LAB — FUNGAL ORGANISM REFLEX

## 2021-08-06 ENCOUNTER — Other Ambulatory Visit: Payer: Self-pay

## 2021-08-06 ENCOUNTER — Ambulatory Visit (INDEPENDENT_AMBULATORY_CARE_PROVIDER_SITE_OTHER): Payer: Medicaid Other | Admitting: Internal Medicine

## 2021-08-06 ENCOUNTER — Encounter: Payer: Self-pay | Admitting: Internal Medicine

## 2021-08-06 VITALS — BP 148/80 | HR 98 | Temp 98.0°F | Ht 59.0 in | Wt 184.6 lb

## 2021-08-06 DIAGNOSIS — T8149XA Infection following a procedure, other surgical site, initial encounter: Secondary | ICD-10-CM | POA: Diagnosis not present

## 2021-08-06 NOTE — Progress Notes (Signed)
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   MRN: [id number]  Left hip surgical site infection = cc   HPI:  Marissa Gray is a 40 y.o. female down syndrome, s/p left hip replacement for OA on 4/27, complicated by early superficial wound infection s/p debridement 5/23 cx showing mrsa on oral abx here for f/u   I reviewed chart note by Dr Luciana Axe and orthopedics from her may admission I reviewed the op note. There was no wound dehiscence, just redness/seropurulent discharge, pocket of purulence in sq tissue, but the underlying fascia was "water tight" and no concern for deep space infection or hardware involvement. Ir aspirated the hip joint and cx was negative and only 2k wbc  Dr Luciana Axe rx'ed her 2 week doxycycline  Patient is doin gmuch better. No swelling/discharge/pain in the left hip She'll see ortho tomorrow  No n/v/diarrhea  8/10 id f/u Doing well off abx No f/c No focal left hip pain/swelling/redness See ortho in a few weeks. Previously seen them around the time f/u with me   Allergies  Allergen Reactions   Cefaclor Hives and Rash    Other reaction(s): Unknown    Penicillins Hives     Has patient had a PCN reaction causing immediate rash, facial/tongue/throat swelling, SOB or lightheadedness with hypotension: Unknown Has patient had a PCN reaction causing severe rash involving mucus membranes or skin necrosis: Unknown Has patient had a PCN reaction that required hospitalization: Unknown Has patient had a PCN reaction occurring within the last 10 years: Unknown If all of the above answers are "NO", then may proceed with Cephalosporin use. Other reaction(s): rash, Unknown   Sulfa Antibiotics Hives and Other (See Comments)    FEVER Other reaction(s): Unknown      Outpatient Medications Prior to Visit  Medication Sig Dispense Refill   aspirin 81 MG chewable tablet Chew 81 mg by mouth daily. For heart health for 3 weeks     clotrimazole-betamethasone (LOTRISONE) cream Apply 1 application topically 2 (two) times daily.     doxycycline (VIBRAMYCIN) 100 MG capsule Take 1 capsule (100 mg total) by mouth 2 (two) times daily. 28 capsule 0   esomeprazole (NEXIUM) 40 MG capsule Take 40 mg by mouth in the morning. (0700) (Patient not taking: Reported on 06/05/2021)     Eyelid Cleansers (OCUSOFT EYELID CLEANSING) PADS Place 1 application into both eyes in the morning and at bedtime. Apply to Eyelid and eyelash topically two times a day for Dry eyes     Eyelid Cleansers (OCUSOFT EYELID CLEANSING) PADS See admin instructions.     folic acid (FOLVITE) 1 MG tablet Take 1 mg by mouth in the morning. (  0700)     HYDROcodone-acetaminophen (NORCO/VICODIN) 5-325 MG tablet Take 1-2 tablets by mouth every 6 (six) hours as needed for severe pain. 20 tablet 0   levonorgestrel-ethinyl estradiol (ALESSE) 0.1-20 MG-MCG tablet Take 1 tablet by mouth in the morning. (0700)     loratadine (CLARITIN) 10 MG tablet Take 10 mg by mouth daily as needed for allergies.     methocarbamol (ROBAXIN) 500 MG tablet Take 1 tablet (500 mg total) by mouth every 6 (six) hours as needed for muscle  spasms. 40 tablet 0   pantoprazole (PROTONIX) 40 MG tablet Take 40 mg by mouth daily.     potassium chloride (KLOR-CON) 10 MEQ tablet 1 tablet with food     potassium chloride (MICRO-K) 10 MEQ CR capsule Take 10 mEq by mouth 2 (two) times daily. (0700 & 2000)     rivaroxaban (XARELTO) 10 MG TABS tablet Take 1 tablet (10 mg total) by mouth daily with breakfast. (Patient not taking: Reported on 06/05/2021) 20 tablet 0   sodium chloride (OCEAN) 0.65 % nasal spray Place 1 spray into the nose 2 (two) times daily. (0700 & 2000)     vitamin B-12 (CYANOCOBALAMIN) 1000 MCG tablet Take 1 tablet (1,000 mcg total) by mouth daily. (Patient not taking: Reported on 06/05/2021) 30 tablet 0   No facility-administered medications prior to visit.     Social History   Socioeconomic History   Marital status: Single    Spouse name: Not on file   Number of children: Not on file   Years of education: Not on file   Highest education level: Not on file  Occupational History   Not on file  Tobacco Use   Smoking status: Never   Smokeless tobacco: Never  Vaping Use   Vaping Use: Never used  Substance and Sexual Activity   Alcohol use: No   Drug use: No   Sexual activity: Not on file  Other Topics Concern   Not on file  Social History Narrative   Not on file   Social Determinants of Health   Financial Resource Strain: Not on file  Food Insecurity: Not on file  Transportation Needs: Not on file  Physical Activity: Not on file  Stress: Not on file  Social Connections: Not on file  Intimate Partner Violence: Not on file      Review of Systems    Other ros negative Objective:    There were no vitals taken for this visit. Nursing note and vital signs reviewed.  Physical Exam  General/constitutional: no distress, pleasant HEENT: Normocephalic, PER, Conj Clear, EOMI, Oropharynx clear Neck supple CV: rrr no mrg Lungs: clear to auscultation, normal respiratory effort Abd: Soft, Nontender Ext:  no edema Skin: No Rash Neuro: nonfocal MSK: no peripheral joint swelling/tenderness/warmth; back spines nontender      Labs: 6/03 cbc 5/8/544;  5/31 cr 0.8   Crp: 5/26    5.1 5/22    22.5   Micro:  Serology:  Imaging:  Assessment & Plan:   Problem List Items Addressed This Visit   None Visit Diagnoses     Surgical site infection    -  Primary      Patient doing well Reviewed op note, seems all superficial, but difficult to say sometimes.   The wound today looks healed and there is no sign of inflammation She has 1 more day of doxycycline  Will need to check her inflammation markers and evaluate 2-4 weeks after she finish antibiotics to see  how she does  She finished 2 weeks doxy on 6/10. No sign of deeper space surgical site infection. She'll continue to see ortho as needed  -discuss s/s of recurrent infection and to call [facility] clinic if needed -otherwise discharge from id clinic   Follow-up: Return for discharge from id clinic.      Raymondo Band, MD [facility] [phone]  pager   [phone] cell 08/06/2021, 1:59 PM

## 2021-08-06 NOTE — Patient Instructions (Signed)
Your wound appeared all treated for infection  Watch for sign of infection recurrence like pain/swelling/skin breakdown/redness then give our clinic a call if that happens  See your orthopedic surgeon as directed  No need to schedule any ID clinic f/u at this time

## 2021-08-13 ENCOUNTER — Other Ambulatory Visit: Payer: Self-pay

## 2021-08-13 ENCOUNTER — Telehealth: Payer: Self-pay

## 2021-08-13 ENCOUNTER — Emergency Department (HOSPITAL_COMMUNITY)
Admit: 2021-08-13 | Discharge: 2021-08-13 | Disposition: A | Discharge status: Home / Self Care | Payer: Medicaid Other | Attending: Emergency Medicine | Admitting: Emergency Medicine

## 2021-08-13 ENCOUNTER — Inpatient Hospital Stay (HOSPITAL_COMMUNITY)
Admission: EM | Admit: 2021-08-13 | Discharge: 2021-08-22 | DRG: 271 | Disposition: A | Discharge status: Home / Self Care | Payer: Medicaid Other | Attending: Internal Medicine | Admitting: Internal Medicine

## 2021-08-13 ENCOUNTER — Encounter (HOSPITAL_COMMUNITY): Payer: Self-pay | Admitting: Emergency Medicine

## 2021-08-13 DIAGNOSIS — Q909 Down syndrome, unspecified: Secondary | ICD-10-CM | POA: Diagnosis not present

## 2021-08-13 DIAGNOSIS — M79605 Pain in left leg: Secondary | ICD-10-CM | POA: Diagnosis not present

## 2021-08-13 DIAGNOSIS — J45909 Unspecified asthma, uncomplicated: Secondary | ICD-10-CM | POA: Diagnosis present

## 2021-08-13 DIAGNOSIS — I82409 Acute embolism and thrombosis of unspecified deep veins of unspecified lower extremity: Secondary | ICD-10-CM | POA: Diagnosis present

## 2021-08-13 DIAGNOSIS — Z7901 Long term (current) use of anticoagulants: Secondary | ICD-10-CM

## 2021-08-13 DIAGNOSIS — I82412 Acute embolism and thrombosis of left femoral vein: Secondary | ICD-10-CM | POA: Diagnosis present

## 2021-08-13 DIAGNOSIS — Z7982 Long term (current) use of aspirin: Secondary | ICD-10-CM

## 2021-08-13 DIAGNOSIS — M069 Rheumatoid arthritis, unspecified: Secondary | ICD-10-CM | POA: Diagnosis present

## 2021-08-13 DIAGNOSIS — K219 Gastro-esophageal reflux disease without esophagitis: Secondary | ICD-10-CM | POA: Diagnosis present

## 2021-08-13 DIAGNOSIS — D638 Anemia in other chronic diseases classified elsewhere: Secondary | ICD-10-CM | POA: Diagnosis present

## 2021-08-13 DIAGNOSIS — I824Y2 Acute embolism and thrombosis of unspecified deep veins of left proximal lower extremity: Secondary | ICD-10-CM | POA: Diagnosis not present

## 2021-08-13 DIAGNOSIS — Z888 Allergy status to other drugs, medicaments and biological substances status: Secondary | ICD-10-CM | POA: Diagnosis not present

## 2021-08-13 DIAGNOSIS — Z79899 Other long term (current) drug therapy: Secondary | ICD-10-CM | POA: Diagnosis not present

## 2021-08-13 DIAGNOSIS — I82402 Acute embolism and thrombosis of unspecified deep veins of left lower extremity: Secondary | ICD-10-CM

## 2021-08-13 DIAGNOSIS — L03116 Cellulitis of left lower limb: Secondary | ICD-10-CM | POA: Diagnosis present

## 2021-08-13 DIAGNOSIS — E039 Hypothyroidism, unspecified: Secondary | ICD-10-CM | POA: Diagnosis present

## 2021-08-13 DIAGNOSIS — E063 Autoimmune thyroiditis: Secondary | ICD-10-CM | POA: Diagnosis present

## 2021-08-13 DIAGNOSIS — D509 Iron deficiency anemia, unspecified: Secondary | ICD-10-CM | POA: Diagnosis present

## 2021-08-13 DIAGNOSIS — Z20822 Contact with and (suspected) exposure to covid-19: Secondary | ICD-10-CM | POA: Diagnosis present

## 2021-08-13 DIAGNOSIS — Z96642 Presence of left artificial hip joint: Secondary | ICD-10-CM | POA: Diagnosis present

## 2021-08-13 DIAGNOSIS — L0390 Cellulitis, unspecified: Secondary | ICD-10-CM

## 2021-08-13 DIAGNOSIS — Z880 Allergy status to penicillin: Secondary | ICD-10-CM | POA: Diagnosis not present

## 2021-08-13 DIAGNOSIS — B9562 Methicillin resistant Staphylococcus aureus infection as the cause of diseases classified elsewhere: Secondary | ICD-10-CM

## 2021-08-13 DIAGNOSIS — L538 Other specified erythematous conditions: Secondary | ICD-10-CM

## 2021-08-13 DIAGNOSIS — Z882 Allergy status to sulfonamides status: Secondary | ICD-10-CM

## 2021-08-13 DIAGNOSIS — M7989 Other specified soft tissue disorders: Secondary | ICD-10-CM | POA: Diagnosis not present

## 2021-08-13 LAB — CBC WITH DIFFERENTIAL/PLATELET
Abs Immature Granulocytes: 0.01 10*3/uL (ref 0.00–0.07)
Basophils Absolute: 0 10*3/uL (ref 0.0–0.1)
Basophils Relative: 1 %
Eosinophils Absolute: 0.1 10*3/uL (ref 0.0–0.5)
Eosinophils Relative: 1 %
HCT: 29.3 % — ABNORMAL LOW (ref 36.0–46.0)
Hemoglobin: 8.5 g/dL — ABNORMAL LOW (ref 12.0–15.0)
Immature Granulocytes: 0 %
Lymphocytes Relative: 33 %
Lymphs Abs: 1.9 10*3/uL (ref 0.7–4.0)
MCH: 26.9 pg (ref 26.0–34.0)
MCHC: 29 g/dL — ABNORMAL LOW (ref 30.0–36.0)
MCV: 92.7 fL (ref 80.0–100.0)
Monocytes Absolute: 0.4 10*3/uL (ref 0.1–1.0)
Monocytes Relative: 7 %
Neutro Abs: 3.4 10*3/uL (ref 1.7–7.7)
Neutrophils Relative %: 58 %
Platelets: 350 10*3/uL (ref 150–400)
RBC: 3.16 MIL/uL — ABNORMAL LOW (ref 3.87–5.11)
RDW: 19.7 % — ABNORMAL HIGH (ref 11.5–15.5)
WBC: 5.9 10*3/uL (ref 4.0–10.5)
nRBC: 0 % (ref 0.0–0.2)

## 2021-08-13 LAB — BASIC METABOLIC PANEL
Anion gap: 10 (ref 5–15)
BUN: 13 mg/dL (ref 6–20)
CO2: 22 mmol/L (ref 22–32)
Calcium: 8.1 mg/dL — ABNORMAL LOW (ref 8.9–10.3)
Chloride: 110 mmol/L (ref 98–111)
Creatinine, Ser: 0.62 mg/dL (ref 0.44–1.00)
GFR, Estimated: >60 mL/min (ref >60)
Glucose, Bld: 90 mg/dL (ref 70–99)
Potassium: 4 mmol/L (ref 3.5–5.1)
Sodium: 142 mmol/L (ref 135–145)

## 2021-08-13 LAB — RESP PANEL BY RT-PCR (FLU A&B, COVID) ARPGX2
Influenza A by PCR: NEGATIVE
Influenza B by PCR: NEGATIVE
SARS Coronavirus 2 by RT PCR: NEGATIVE

## 2021-08-13 MED ORDER — HEPARIN (PORCINE) 25000 UT/250ML-% IV SOLN
1550.0000 [IU]/h | INTRAVENOUS | Status: DC
  Administered 2021-08-13: 1200 [IU]/h via INTRAVENOUS
  Administered 2021-08-14: 1400 [IU]/h via INTRAVENOUS
  Administered 2021-08-15 – 2021-08-19 (×7): 1550 [IU]/h via INTRAVENOUS
  Filled 2021-08-13 (×10): qty 250

## 2021-08-13 MED ORDER — CLINDAMYCIN PHOSPHATE 600 MG/50ML IV SOLN: 600.0000 mg | Freq: Three times a day (TID) | INTRAVENOUS | Status: DC

## 2021-08-13 MED ORDER — VANCOMYCIN HCL 1250 MG/250ML IV SOLN
1250.0000 mg | INTRAVENOUS | Status: DC
  Administered 2021-08-14 – 2021-08-19 (×6): 1250 mg via INTRAVENOUS
  Filled 2021-08-13 (×7): qty 250

## 2021-08-13 MED ORDER — HEPARIN BOLUS VIA INFUSION
2000.0000 [IU] | Freq: Once | INTRAVENOUS | Status: AC
  Administered 2021-08-13: 2000 [IU] via INTRAVENOUS
  Filled 2021-08-13: qty 2000

## 2021-08-13 MED ORDER — VANCOMYCIN HCL 2000 MG/400ML IV SOLN
2000.0000 mg | Freq: Once | INTRAVENOUS | Status: AC
  Administered 2021-08-14: 2000 mg via INTRAVENOUS
  Filled 2021-08-13: qty 400

## 2021-08-13 MED ORDER — ACETAMINOPHEN 325 MG PO TABS
650.0000 mg | ORAL_TABLET | Freq: Four times a day (QID) | ORAL | Status: DC | PRN
  Administered 2021-08-17 – 2021-08-21 (×4): 650 mg via ORAL
  Filled 2021-08-13 (×5): qty 2

## 2021-08-13 NOTE — ED Provider Notes (Signed)
[facility]-EMERGENCY DEPT Provider Note   CSN: [id number] Arrival date & time: 08/13/21  1600     History Chief Complaint  Patient presents with   Leg Swelling    Marissa Gray is a 40 y.o. female with a history of asthma, Down syndrome, Hashimoto's disease, left hip arthroplasty 4/27 with surgical site infection.  Presents today with a chief complaint of swelling, erythema, and warmth to left lower extremity.  Patient and her mother report symptoms started yesterday.  Symptoms have gotten gradually worse since then.  Patient denies any numbness, weakness, shortness of breath, chest pain, hemoptysis.  No known history of DVT/PE.    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   Dermatitis    left leg    Down's syndrome    GERD (gastroesophageal reflux disease)    Hashimoto's disease    Heart murmur    Hypothyroidism    Pneumonia    hx of years ago    Rheumatoid arthritis (HCC)    Strabismus     Patient Active Problem List   Diagnosis Date Noted   Post-operative infection 05/17/2021   OA (osteoarthritis) of hip 04/23/2021   Primary osteoarthritis of left hip 04/23/2021   Hashimoto's thyroiditis 09/12/2017   Hypothyroidism 08/03/2017   Nodular thyroid disease 08/03/2017   Chronic pain of left knee 07/04/2017   Moderate asthma without complication 06/28/2017   Hypokalemia 06/28/2017   Constipation 06/28/2017   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, 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   Gastroesophageal reflux disease without esophagitis    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   Down's syndrome    Rheumatoid arthritis involving multiple sites [facility])    Closed bicondylar fracture of right tibial plateau 06/20/2017    Past Surgical History:  Procedure Laterality Date    CARDIAC SURGERY     ASD and vSD repair    FOOT ARTHRODESIS, TRIPLE     bilateral    I & D EXTREMITY Left 05/19/2021   Procedure: IRRIGATION AND DEBRIDEMENT LEFT HIP WITH POSSIBLE HEAD AND LINER EXCHANGE;  Surgeon: Samson Frederic, MD;  Location: [facility] ORS;  Service: Orthopedics;  Laterality: Left;   ORIF TIBIA PLATEAU Right 06/22/2017   Procedure: OPEN REDUCTION INTERNAL FIXATION (ORIF) TIBIAL PLATEAU;  Surgeon: Myrene Galas, MD;  Location: [facility] OR;  Service: Orthopedics;  Laterality: Right;   STRABISMUS SURGERY     TOTAL HIP ARTHROPLASTY Left 04/23/2021   Procedure: TOTAL HIP ARTHROPLASTY-Posterior;  Surgeon: Ollen Gross, MD;  Location: [facility];  Service: Orthopedics;  Laterality: Left;    ulcer surgery        OB History   No obstetric history on file.     Family History  Problem Relation Age of Onset   Heart disease Neg Hx     Social History   Tobacco Use   Smoking status: Never   Smokeless tobacco: Never  Vaping Use   Vaping Use: Never used  Substance Use Topics   Alcohol use: No   Drug use: No    Home Medications Prior to Admission medications   Medication Sig Start Date End Date Taking? Authorizing Provider  aspirin 81 MG chewable tablet Chew 81 mg by mouth daily. For heart health for 3 weeks    [provider]  clotrimazole-betamethasone (LOTRISONE) cream Apply  1 application topically 2 (two) times daily.    [provider]  doxycycline (VIBRAMYCIN) 100 MG capsule Take 1 capsule (100 mg total) by mouth 2 (two) times daily. 05/23/21   Nelia Shi D, PA-C  esomeprazole (NEXIUM) 40 MG capsule Take 40 mg by mouth in the morning. (0700) Patient not taking: Reported on 06/05/2021    [provider]  Eyelid Cleansers (OCUSOFT EYELID CLEANSING) PADS Place 1 application into both eyes in the morning and at bedtime. Apply to Eyelid and eyelash topically two times a day for Dry eyes    [provider]  Eyelid Cleansers (OCUSOFT EYELID  CLEANSING) PADS See admin instructions.    [provider]  folic acid (FOLVITE) 1 MG tablet Take 1 mg by mouth in the morning. (0700)    [provider]  HYDROcodone-acetaminophen (NORCO/VICODIN) 5-325 MG tablet Take 1-2 tablets by mouth every 6 (six) hours as needed for severe pain. 05/23/21 05/23/22  Nelia Shi D, PA-C  levonorgestrel-ethinyl estradiol (ALESSE) 0.1-20 MG-MCG tablet Take 1 tablet by mouth in the morning. (0700)    [provider]  loratadine (CLARITIN) 10 MG tablet Take 10 mg by mouth daily as needed for allergies.    [provider]  methocarbamol (ROBAXIN) 500 MG tablet Take 1 tablet (500 mg total) by mouth every 6 (six) hours as needed for muscle spasms. 05/23/21   Nelia Shi D, PA-C  pantoprazole (PROTONIX) 40 MG tablet Take 40 mg by mouth daily.    [provider]  potassium chloride (KLOR-CON) 10 MEQ tablet 1 tablet with food    [provider]  potassium chloride (MICRO-K) 10 MEQ CR capsule Take 10 mEq by mouth 2 (two) times daily. (0700 & 2000)    [provider]  rivaroxaban (XARELTO) 10 MG TABS tablet Take 1 tablet (10 mg total) by mouth daily with breakfast. Patient not taking: Reported on 06/05/2021 04/24/21   Nelia Shi D, PA-C  sodium chloride (OCEAN) 0.65 % nasal spray Place 1 spray into the nose 2 (two) times daily. (0700 & 2000)    [provider]  vitamin B-12 (CYANOCOBALAMIN) 1000 MCG tablet Take 1 tablet (1,000 mcg total) by mouth daily. Patient not taking: Reported on 06/05/2021 05/23/21   Albertine Grates, MD    Allergies    Cefaclor, Penicillins, and Sulfa antibiotics  Review of Systems   Review of Systems  Constitutional:  Negative for chills and fever.  Eyes:  Negative for visual disturbance.  Respiratory:  Negative for shortness of breath.   Cardiovascular:  Positive for leg swelling. Negative for chest pain and palpitations.  Gastrointestinal:  Negative for abdominal pain,  nausea and vomiting.  Musculoskeletal:  Negative for back pain and neck pain.  Skin:  Negative for color change and rash.  Neurological:  Negative for dizziness, syncope, light-headedness and headaches.  Hematological:  Does not bruise/bleed easily.  Psychiatric/Behavioral:  Negative for confusion.    Physical Exam Updated Vital Signs BP 139/81 (BP Location: Left Arm)   Pulse 95   Temp 97.8 F (36.6 C) (Oral)   Resp 18   SpO2 100%   Physical Exam Vitals and nursing note reviewed.  Constitutional:      General: She is not in acute distress.    Appearance: She is not ill-appearing, toxic-appearing or diaphoretic.  HENT:     Head: Normocephalic.  Eyes:     General: No scleral icterus.       Right eye: No discharge.  Left eye: No discharge.  Cardiovascular:     Rate and Rhythm: Normal rate.     Pulses:          Dorsalis pedis pulses are 2+ on the right side and detected w/ Doppler on the left side.  Pulmonary:     Effort: Pulmonary effort is normal.  Musculoskeletal:     Left upper leg: Edema present. No swelling, deformity, lacerations, tenderness or bony tenderness.     Left knee: Erythema present. No swelling, deformity, effusion, ecchymosis, lacerations or bony tenderness. Normal range of motion. No tenderness.     Right lower leg: Normal.     Left lower leg: Swelling and tenderness present. No deformity, lacerations or bony tenderness. 3+ Edema present.     Left ankle: Swelling present. No deformity, ecchymosis or lacerations. No tenderness. Normal range of motion.     Left foot: Normal range of motion and normal capillary refill. Swelling present. No deformity, laceration, tenderness or bony tenderness.     Comments: Edema, warmth and erythema noted throughout left lower extremity.  Tenderness to left calf.  Feet:     Right foot:     Skin integrity: Skin integrity normal.     Toenail Condition: Right toenails are abnormally thick.     Left foot:     Skin  integrity: Callus and dry skin present. No ulcer, blister, erythema, warmth or fissure.     Toenail Condition: Left toenails are abnormally thick.  Skin:    General: Skin is warm and dry.  Neurological:     General: No focal deficit present.     Mental Status: She is alert.  Psychiatric:        Behavior: Behavior is cooperative.      ED Results / Procedures / Treatments   Labs (all labs ordered are listed, but only abnormal results are displayed) Labs Reviewed  BASIC METABOLIC PANEL - Abnormal; Notable for the following components:      Result Value   Calcium 8.1 (*)    All other components within normal limits  CBC WITH DIFFERENTIAL/PLATELET - Abnormal; Notable for the following components:   RBC 3.16 (*)    Hemoglobin 8.5 (*)    HCT 29.3 (*)    MCHC 29.0 (*)    RDW 19.7 (*)    All other components within normal limits  RESP PANEL BY RT-PCR (FLU A&B, COVID) [id number]    EKG None  Radiology VAS [place] LOWER EXTREMITY VENOUS (DVT) (ONLY [facility])  Result Date: 08/13/2021  Lower Venous DVT Study Patient Name:  Marissa Gray  Date of Exam:   08/13/2021 Medical Rec #: [id number]        Accession #:    [id number] Date of Birth: 1981/06/28         Patient Gender: F Patient Age:   23 years Exam Location:  [facility] Procedure:      VAS [place] LOWER EXTREMITY VENOUS (DVT) Referring Phys: Parke Poisson --------------------------------------------------------------------------------  Indications: Pain, Swelling, and Erythema.  Risk Factors: None identified. Limitations: Body habitus, poor ultrasound/tissue interface and patient positioning, patient pain tolerance. Comparison Study: No prior studies. Performing Technologist: Chanda Busing RVT  Examination Guidelines: A complete evaluation includes B-mode imaging, spectral Doppler, color Doppler, and power Doppler as needed of all accessible portions of each vessel. Bilateral testing is considered an integral part of a complete  examination. Limited examinations for reoccurring indications may be performed as noted. The reflux portion of the exam is  performed with the patient in reverse Trendelenburg.  +-----+---------------+---------+-----------+----------+--------------+ RIGHTCompressibilityPhasicitySpontaneityPropertiesThrombus Aging +-----+---------------+---------+-----------+----------+--------------+ CFV  Full           Yes      Yes                                 +-----+---------------+---------+-----------+----------+--------------+   +---------+---------------+---------+-----------+----------+-------------------+ LEFT     CompressibilityPhasicitySpontaneityPropertiesThrombus Aging      +---------+---------------+---------+-----------+----------+-------------------+ CFV      None           No       No                   Acute               +---------+---------------+---------+-----------+----------+-------------------+ SFJ      None                                         Acute               +---------+---------------+---------+-----------+----------+-------------------+ FV Prox  None           No       No                   Acute               +---------+---------------+---------+-----------+----------+-------------------+ FV Mid                  No       No                   Acute               +---------+---------------+---------+-----------+----------+-------------------+ FV Distal               No       No                   Acute               +---------+---------------+---------+-----------+----------+-------------------+ PFV                                                   Not well visualized +---------+---------------+---------+-----------+----------+-------------------+ POP      None           No       No                   Acute               +---------+---------------+---------+-----------+----------+-------------------+ PTV      Full                                                              +---------+---------------+---------+-----------+----------+-------------------+ PERO  Not well visualized +---------+---------------+---------+-----------+----------+-------------------+ GSV      None                                         Acute               +---------+---------------+---------+-----------+----------+-------------------+ EIV                     No       Yes                                      +---------+---------------+---------+-----------+----------+-------------------+ CIV                                                   Not visualized      +---------+---------------+---------+-----------+----------+-------------------+    Summary: RIGHT: - No evidence of common femoral vein obstruction.  LEFT: - Findings consistent with acute deep vein thrombosis involving the left common femoral vein, left femoral vein, and left popliteal vein. - Findings consistent with acute superficial vein thrombosis involving the left great saphenous vein. - No cystic structure found in the popliteal fossa.  *See table(s) above for measurements and observations.    Preliminary     Procedures .Critical Care  Date/Time: 08/13/2021 11:19 PM Performed by: Haskel Schroeder, PA-C Authorized by: Haskel Schroeder, PA-C   Critical care provider statement:    Critical care time (minutes):  45   Critical care was necessary to treat or prevent imminent or life-threatening deterioration of the following conditions:  Circulatory failure   Critical care was time spent personally by me on the following activities:  Discussions with consultants, examination of patient, ordering and performing treatments and interventions, ordering and review of laboratory studies, ordering and review of radiographic studies, pulse oximetry, re-evaluation of patient's condition, obtaining history from patient or  surrogate and review of old charts   Care discussed with: admitting provider     Medications Ordered in ED Medications - No data to display  ED Course  I have reviewed the triage vital signs and the nursing notes.  Pertinent labs & imaging results that were available during my care of the patient were reviewed by me and considered in my medical decision making (see chart for details).  Clinical Course as of 08/13/21 2321  Wed Aug 13, 2021  1758 Dvt positive per radiolog [PB]  [phone] Spoke to on call vascular specialist  [PB]  1932 Spoke to hospitalist  [PB]    Clinical Course User Index [PB] Berneice Heinrich   MDM Rules/Calculators/A&P                           Alert 40 year old female in no acute distress, nontoxic appearing.  Patient's mother is at bedside.  Presents with chief complaint of left lower extremity swelling, erythema, and warmth.  Left DP pulse found by Doppler.  Marked edema to left lower extremity, diffuse tenderness, diffuse erythema and warmth.  Concern for DVT versus cellulitis.  DVT study and basic lab work obtained while patient was in triage.  DVT ultra study positive for DVT positive  left common femoral vein, left femoral vein, and left popliteal vein.  Due to proximal DVTs we will consult on-call vascular specialist.  We will hold anticoagulation at this time until speaking with vascular specialist determine if patient is started on heparin or will be discharged on oral medication.  1758 spoke to on-call vascular surgeon Dr.Brabham who advised patient will need to be admitted to hospitalist team, transferred to [facility] and seen by vascular team.    We will start patient on heparin  and consult hospitalist team.  Spoke to hospitalist Dr.Rathore who agreed to see the patient for admission.  Patient care and treatment with discussed with attending physician Dr. Wallace Cullens.  Final Clinical Impression(s) / ED Diagnoses Final diagnoses:  None    Rx /  DC Orders ED Discharge Orders     None        Haskel Schroeder, PA-C 08/13/21 2324    Sloan Leiter, DO 08/13/21 2331

## 2021-08-13 NOTE — Telephone Encounter (Signed)
Received call today from Lane Hacker with concerns regarding patient's leg.States that her leg is swollen, red, draining, and painful to the tough. Was not able to schedule appointment with DR. Vu for Friday.  Nicole Cella will take patient to local ED. Patient's mother will try to upload pictures on mychart. Juanita Laster, RMA

## 2021-08-13 NOTE — ED Triage Notes (Signed)
Pt arrives POV with complaints of swelling, redness, and warmth in her L leg and thigh. Pt got a L hip replacement in April. In may pt was treated for MRSA; pt woke up today with swelling, redness, and warmth to leg.

## 2021-08-13 NOTE — Consult Note (Signed)
[facility]  Patient name: Marissa Gray MRN: [id number] DOB: 1981-06-29 Sex: female   REQUESTING PROVIDER:   ER   REASON FOR CONSULT:    Left leg DVT  HISTORY OF PRESENT ILLNESS:   Marissa Gray is a 40 y.o. female, who presented to the emergency department with pain and swelling of her left leg that has been going on for several days but has gotten worse.  The patient underwent left hip replacement for osteoarthritis on 04/23/2021.  This was complicated by a early superficial wound infection which was debrided on 523.  Cultures grew out MRSA.  She has been on antibiotics per infectious disease.  Her wound has healed.  The patient has Down syndrome and lives in a group home.  Her mother is present at the bedside.  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   Dermatitis    left leg    Down's syndrome    GERD (gastroesophageal reflux disease)    Hashimoto's disease    Heart murmur    Hypothyroidism    Pneumonia    hx of years ago    Rheumatoid arthritis (HCC)    Strabismus      FAMILY HISTORY   Family History  Problem Relation Age of Onset   Heart disease Neg Hx     SOCIAL HISTORY:   Social History   Socioeconomic History   Marital status: Single    Spouse name: Not on file   Number of children: Not on file   Years of education: Not on file   Highest education level: Not on file  Occupational History   Not on file  Tobacco Use   Smoking status: Never   Smokeless tobacco: Never  Vaping Use   Vaping Use: Never used  Substance and Sexual Activity   Alcohol use: No   Drug use: No   Sexual activity: Not on file  Other Topics Concern   Not on file  Social History Narrative   Not on file    Social Determinants of Health   Financial Resource Strain: Not on file  Food Insecurity: Not on file  Transportation Needs: Not on file  Physical Activity: Not on file  Stress: Not on file  Social Connections: Not on file  Intimate Partner Violence: Not on file    ALLERGIES:    Allergies  Allergen Reactions   Cefaclor Hives and Rash    Other reaction(s): Unknown   Penicillins Hives     Has patient had a PCN reaction causing immediate rash, facial/tongue/throat swelling, SOB or lightheadedness with hypotension: Unknown Has patient had a PCN reaction causing severe rash involving mucus membranes or skin necrosis: Unknown Has patient had a PCN reaction that required hospitalization: Unknown Has patient had a PCN reaction occurring within the last 10 years: Unknown If all of the above answers are "NO", then may proceed with Cephalosporin use. Other reaction(s): rash, Unknown   Sulfa Antibiotics Hives and Other (See Comments)    FEVER Other reaction(s): Unknown    CURRENT MEDICATIONS:    Current Facility-Administered Medications  Medication Dose Route Frequency Provider Last Rate Last Admin   acetaminophen (TYLENOL) tablet 650 mg  650 mg Oral Q6H PRN John Giovanni, MD  heparin ADULT infusion 100 units/mL (25000 units/282mL)  1,200 Units/hr Intravenous Continuous Danford Bad, RPH 12 mL/hr at 08/13/21 2216 1,200 Units/hr at 08/13/21 2216   heparin bolus via infusion 2,000 Units  2,000 Units Intravenous Once Danford Bad, RPH       [START ON 08/14/2021] vancomycin (VANCOREADY) IVPB 1250 mg/250 mL  1,250 mg Intravenous Q24H Wofford, Drew A, RPH       vancomycin (VANCOREADY) IVPB 2000 mg/400 mL  2,000 mg Intravenous Once John Giovanni, MD       Current Outpatient Medications  Medication Sig Dispense Refill   acetaminophen (TYLENOL) 500 MG tablet Take 500 mg by mouth 3 (three) times daily as needed for moderate pain or fever.     albuterol (VENTOLIN HFA) 108  (90 Base) MCG/ACT inhaler Inhale 2 puffs into the lungs every 6 (six) hours as needed for wheezing or shortness of breath.     azithromycin (ZITHROMAX) 500 MG tablet Take 500 mg by mouth as directed. Take 1 tablet (500 mg) prior to Dental Procedure     calcium carbonate (CALCIUM 600) 600 MG TABS tablet Take 600 mg by mouth daily.     Cholecalciferol (VITAMIN D) 50 MCG (2000 UT) tablet Take 2,000 Units by mouth daily.     docusate sodium (COLACE) 100 MG capsule Take 100 mg by mouth 2 (two) times daily.     Eyelid Cleansers (OCUSOFT EYELID CLEANSING) PADS Place 1 application into both eyes in the morning and at bedtime. Apply to Eyelid and eyelash topically two times a day for Dry eyes     fluticasone (FLOVENT HFA) 110 MCG/ACT inhaler Inhale 1 puff into the lungs 2 (two) times daily.     folic acid (FOLVITE) 1 MG tablet Take 3 mg by mouth in the morning. (0700)     ketotifen (ZADITOR) 0.025 % ophthalmic solution Place 1 drop into both eyes 2 (two) times daily.     levothyroxine (SYNTHROID) 75 MCG tablet Take 75 mcg by mouth daily before breakfast.     loratadine (CLARITIN) 10 MG tablet Take 10 mg by mouth daily as needed for allergies.     methotrexate (RHEUMATREX) 2.5 MG tablet Take 7.5-10 mg by mouth as directed. Take 3 tablets (7.5 mg) on Thurs Morning & Take 4 tablets (10 mg) on Thurs Evening. Caution:Chemotherapy. Protect from light.     mineral oil-hydrophilic petrolatum (AQUAPHOR) ointment Apply 1 application topically daily.     montelukast (SINGULAIR) 10 MG tablet Take 10 mg by mouth at bedtime.     Multiple Vitamins-Minerals (MULTIVITAMIN ADULTS PO) Take 1 tablet by mouth daily.     omeprazole (PRILOSEC) 40 MG capsule Take 40 mg by mouth daily.     potassium chloride (MICRO-K) 10 MEQ CR capsule Take 10 mEq by mouth 2 (two) times daily. (0700 & 2000)     pseudoephedrine (SUDAFED) 30 MG tablet Take 30 mg by mouth every 4 (four) hours as needed for congestion.     psyllium (REGULOID) 0.52 g  capsule Take 0.52 g by mouth daily.     sodium chloride (OCEAN) 0.65 % nasal spray Place 1 spray into the nose 2 (two) times daily. (0700 & 2000)     aspirin 81 MG chewable tablet Chew 81 mg by mouth daily. For heart health for 3 weeks     clotrimazole-betamethasone (LOTRISONE) cream Apply 1 application topically 2 (two) times daily.     doxycycline (VIBRAMYCIN) 100 MG capsule Take 1 capsule (100 mg total) by mouth  2 (two) times daily. 28 capsule 0   esomeprazole (NEXIUM) 40 MG capsule Take 40 mg by mouth in the morning. (0700) (Patient not taking: Reported on 06/05/2021)     Eyelid Cleansers (OCUSOFT EYELID CLEANSING) PADS See admin instructions.     HYDROcodone-acetaminophen (NORCO/VICODIN) 5-325 MG tablet Take 1-2 tablets by mouth every 6 (six) hours as needed for severe pain. 20 tablet 0   levonorgestrel-ethinyl estradiol (ALESSE) 0.1-20 MG-MCG tablet Take 1 tablet by mouth in the morning. (0700)     methocarbamol (ROBAXIN) 500 MG tablet Take 1 tablet (500 mg total) by mouth every 6 (six) hours as needed for muscle spasms. 40 tablet 0   pantoprazole (PROTONIX) 40 MG tablet Take 40 mg by mouth daily.     potassium chloride (KLOR-CON) 10 MEQ tablet 1 tablet with food     rivaroxaban (XARELTO) 10 MG TABS tablet Take 1 tablet (10 mg total) by mouth daily with breakfast. (Patient not taking: Reported on 06/05/2021) 20 tablet 0   vitamin B-12 (CYANOCOBALAMIN) 1000 MCG tablet Take 1 tablet (1,000 mcg total) by mouth daily. (Patient not taking: Reported on 06/05/2021) 30 tablet 0    REVIEW OF SYSTEMS:   [X]  denotes positive finding, [ ]  denotes negative finding Cardiac  Comments:  Chest pain or chest pressure:    Shortness of breath upon exertion:    Short of breath when lying flat:    Irregular heart rhythm:        Vascular    Pain in calf, thigh, or hip brought on by ambulation:    Pain in feet at night that wakes you up from your sleep:     Blood clot in your veins:    Leg swelling:  x        Pulmonary    Oxygen at home:    Productive cough:     Wheezing:         Neurologic    Sudden weakness in arms or legs:     Sudden numbness in arms or legs:     Sudden onset of difficulty speaking or slurred speech:    Temporary loss of vision in one eye:     Problems with dizziness:         Gastrointestinal    Blood in stool:      Vomited blood:         Genitourinary    Burning when urinating:     Blood in urine:        Psychiatric    Major depression:         Hematologic    Bleeding problems:    Problems with blood clotting too easily:        Skin    Rashes or ulcers:        Constitutional    Fever or chills:     PHYSICAL EXAM:   Vitals:   08/13/21 1613  BP: 139/81  Pulse: 95  Resp: 18  Temp: 97.8 F (36.6 C)  TempSrc: Oral  SpO2: 100%    GENERAL: The patient is a well-nourished female, in no acute distress. The vital signs are documented above. CARDIAC: There is a regular rate and rhythm.  VASCULAR: Palpable right dorsalis pedis pulse.  I could not palpate the left secondary to edema.  She has erythema extending up the left leg.  The left leg is significantly more swollen than the right. PULMONARY: Nonlabored respirations ABDOMEN: Soft and non-tender with normal pitched bowel sounds.  MUSCULOSKELETAL:  There are no major deformities or cyanosis. NEUROLOGIC: No focal weakness or paresthesias are detected. SKIN: There are no ulcers or rashes noted. PSYCHIATRIC: The patient has a normal affect.  STUDIES:   I have reviewed her vascular lab studies with the following findings: RIGHT:  - No evidence of common femoral vein obstruction.     LEFT:  - Findings consistent with acute deep vein thrombosis involving the left  common femoral vein, left femoral vein, and left popliteal vein.  - Findings consistent with acute superficial vein thrombosis involving the  left great saphenous vein.  - No cystic structure found in the popliteal fossa.   ASSESSMENT  and PLAN   Left leg DVT: This is likely a provoked DVT given her recent surgeries.  I discussed with the patient and her mother that she would benefit from mechanical thrombectomy to try to decrease her thrombus burden.  However before doing this, I would like for her infection to be under control.  I have recommended admission to the hospital for IV heparinization as well as IV antibiotics to help with her cellulitis.  Once her cellulitis has improved, I would consider percutaneous mechanical thrombectomy for her DVT.  This potentially could require stenting.  The patient and her mother are in complete understanding and wished to proceed.  This will need to be done in [facility] so she will need to be transferred.  Potentially because of the infection, this may need to wait until next week however we will monitor her while she is in the hospital.   [name], IV, MD, FACS [facility] [phone] Pager [phone]

## 2021-08-13 NOTE — Progress Notes (Signed)
Pharmacy Antibiotic Note  Marissa Gray is a 40 y.o. female admitted on 08/13/2021 with LLE DVT. Seen by Vascular Surgery, who would also like cellulitis coverage. Pharmacy has been consulted for vancomycin dosing.  Plan: Vancomycin 2000 mg IV now, then 1250 mg IV q24 hr (est AUC 518 based on SCr adj to 0.8; Vd 0.5) Measure vancomycin AUC at steady state as indicated SCr q48 while on vanc      Temp (24hrs), Avg:97.8 F (36.6 C), Min:97.8 F (36.6 C), Max:97.8 F (36.6 C)  Recent Labs  Lab 08/13/21 1726  WBC 5.9  CREATININE 0.62    Estimated Creatinine Clearance: 87.7 mL/min (by C-G formula based on SCr of 0.62 mg/dL).    Allergies  Allergen Reactions   Cefaclor Hives and Rash    Other reaction(s): Unknown   Penicillins Hives     Has patient had a PCN reaction causing immediate rash, facial/tongue/throat swelling, SOB or lightheadedness with hypotension: Unknown Has patient had a PCN reaction causing severe rash involving mucus membranes or skin necrosis: Unknown Has patient had a PCN reaction that required hospitalization: Unknown Has patient had a PCN reaction occurring within the last 10 years: Unknown If all of the above answers are "NO", then may proceed with Cephalosporin use. Other reaction(s): rash, Unknown   Sulfa Antibiotics Hives and Other (See Comments)    FEVER Other reaction(s): Unknown    Thank you for allowing pharmacy to be a part of this patient's care.  Donika Butner A 08/13/2021 10:06 PM

## 2021-08-13 NOTE — Progress Notes (Signed)
Left lower extremity venous duplex has been completed. Preliminary results can be found in CV Proc through chart review.  Results were given to [facility].  08/13/21 6:00 PM Olen Cordial RVT

## 2021-08-13 NOTE — H&P (Addendum)
History and Physical    Marissa Gray HWE:[id number] DOB: [birth year] DOA: 08/13/2021  PCP: Merri Brunette, MD Patient coming from: Nursing home  Chief Complaint: Left leg swelling  HPI: Marissa Gray is a 40 y.o. 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  She presents to the ED today for evaluation of left flank swelling, erythema, and warmth.  In the ED, vital signs stable.  CBC showing WBC 5.9, hemoglobin 8.5 (stable), platelet count 350k.  Differential pending.  BMP showing sodium 142, potassium 4.0, chloride 110, bicarb 22, BUN 13, creatinine 0.6, glucose 90, calcium 8.1.  Screening COVID and influenza PCR pending.  Doppler showing acute DVT involving the left common femoral vein, left femoral vein, and left popliteal vein.  Also showing acute superficial vein thrombosis involving the left great saphenous vein.  ED PA spoke to Dr. Myra Gianotti from vascular surgery who requested admission at [facility] by hospitalist service.  He will see the patient tonight and recommended keeping her n.p.o. until then.  Pharmacy consulted for IV heparin.  History provided by patient and her mother at bedside.  Reporting 1 day history of left leg swelling, erythema, and warmth.  Patient is also experiencing slight discomfort in her leg due to the swelling but no significant pain.  Per mother, no prior history of blood clots.  Patient denies dizziness/lightheadedness, palpitations, or shortness of breath.  Denies any syncopal episodes.  Denies fevers.  She takes methotrexate for rheumatoid arthritis and also takes an oral contraceptive pill.  Reports history of left hip surgery back in April and May.  No other complaints.  Review of Systems:  All systems reviewed and apart from history of presenting illness, are  negative.  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   Dermatitis    left leg    Down's syndrome    GERD (gastroesophageal reflux disease)    Hashimoto's disease    Heart murmur    Hypothyroidism    Pneumonia    hx of years ago    Rheumatoid arthritis (HCC)    Strabismus     Past Surgical History:  Procedure Laterality Date   CARDIAC SURGERY     ASD and vSD repair    FOOT ARTHRODESIS, TRIPLE     bilateral    I & D EXTREMITY Left 05/19/2021   Procedure: IRRIGATION AND DEBRIDEMENT LEFT HIP WITH POSSIBLE HEAD AND LINER EXCHANGE;  Surgeon: Samson Frederic, MD;  Location: [facility];  Service: Orthopedics;  Laterality: Left;   ORIF TIBIA PLATEAU Right 06/22/2017   Procedure: OPEN REDUCTION INTERNAL FIXATION (ORIF) TIBIAL PLATEAU;  Surgeon: Myrene Galas, MD;  Location: [facility];  Service: Orthopedics;  Laterality: Right;   STRABISMUS SURGERY     TOTAL HIP ARTHROPLASTY Left 04/23/2021   Procedure: TOTAL HIP ARTHROPLASTY-Posterior;  Surgeon: Ollen Gross, MD;  Location: [facility];  Service: Orthopedics;  Laterality: Left;    ulcer surgery        reports that she has never smoked. She has never used smokeless tobacco. She reports that she does not drink alcohol and does not use drugs.  Allergies  Allergen Reactions   Cefaclor Hives and Rash  Other reaction(s): Unknown   Penicillins Hives     Has patient had a PCN reaction causing immediate rash, facial/tongue/throat swelling, SOB or lightheadedness with hypotension: Unknown Has patient had a PCN reaction causing severe rash involving mucus membranes or skin necrosis: Unknown Has patient had a PCN reaction that required hospitalization: Unknown Has patient had a PCN reaction occurring within the last 10 years: Unknown If all of the above answers are "NO",  then may proceed with Cephalosporin use. Other reaction(s): rash, Unknown   Sulfa Antibiotics Hives and Other (See Comments)    FEVER Other reaction(s): Unknown    Family History  Problem Relation Age of Onset   Heart disease Neg Hx     Prior to Admission medications   Medication Sig Start Date End Date Taking? Authorizing Provider  aspirin 81 MG chewable tablet Chew 81 mg by mouth daily. For heart health for 3 weeks    [provider]  clotrimazole-betamethasone (LOTRISONE) cream Apply 1 application topically 2 (two) times daily.    [provider]  doxycycline (VIBRAMYCIN) 100 MG capsule Take 1 capsule (100 mg total) by mouth 2 (two) times daily. 05/23/21   Nelia Shi D, PA-C  esomeprazole (NEXIUM) 40 MG capsule Take 40 mg by mouth in the morning. (0700) Patient not taking: Reported on 06/05/2021    [provider]  Eyelid Cleansers (OCUSOFT EYELID CLEANSING) PADS Place 1 application into both eyes in the morning and at bedtime. Apply to Eyelid and eyelash topically two times a day for Dry eyes    [provider]  Eyelid Cleansers (OCUSOFT EYELID CLEANSING) PADS See admin instructions.    [provider]  folic acid (FOLVITE) 1 MG tablet Take 1 mg by mouth in the morning. (0700)    [provider]  HYDROcodone-acetaminophen (NORCO/VICODIN) 5-325 MG tablet Take 1-2 tablets by mouth every 6 (six) hours as needed for severe pain. 05/23/21 05/23/22  Nelia Shi D, PA-C  levonorgestrel-ethinyl estradiol (ALESSE) 0.1-20 MG-MCG tablet Take 1 tablet by mouth in the morning. (0700)    [provider]  loratadine (CLARITIN) 10 MG tablet Take 10 mg by mouth daily as needed for allergies.    [provider]  methocarbamol (ROBAXIN) 500 MG tablet Take 1 tablet (500 mg total) by mouth every 6 (six) hours as needed for muscle spasms. 05/23/21   Nelia Shi D, PA-C  pantoprazole (PROTONIX) 40 MG tablet Take 40 mg by mouth  daily.    [provider]  potassium chloride (KLOR-CON) 10 MEQ tablet 1 tablet with food    [provider]  potassium chloride (MICRO-K) 10 MEQ CR capsule Take 10 mEq by mouth 2 (two) times daily. (0700 & 2000)    [provider]  rivaroxaban (XARELTO) 10 MG TABS tablet Take 1 tablet (10 mg total) by mouth daily with breakfast. Patient not taking: Reported on 06/05/2021 04/24/21   Nelia Shi D, PA-C  sodium chloride (OCEAN) 0.65 % nasal spray Place 1 spray into the nose 2 (two) times daily. (0700 & 2000)    [provider]  vitamin B-12 (CYANOCOBALAMIN) 1000 MCG tablet Take 1 tablet (1,000 mcg total) by mouth daily. Patient not taking: Reported on 06/05/2021 05/23/21   Albertine Grates, MD    Physical Exam: Vitals:   08/13/21 1613  BP: 139/81  Pulse: 95  Resp: 18  Temp: 97.8 F (36.6 C)  TempSrc: Oral  SpO2: 100%    Physical Exam Constitutional:      General:  She is not in acute distress. HENT:     Head: Normocephalic and atraumatic.  Eyes:     Conjunctiva/sclera: Conjunctivae normal.  Cardiovascular:     Rate and Rhythm: Normal rate and regular rhythm.     Pulses: Normal pulses.  Pulmonary:     Effort: Pulmonary effort is normal. No respiratory distress.     Breath sounds: Normal breath sounds. No wheezing or rales.  Abdominal:     General: Bowel sounds are normal. There is no distension.     Palpations: Abdomen is soft.     Tenderness: There is no abdominal tenderness.  Musculoskeletal:     Cervical back: Normal range of motion and neck supple.     Left lower leg: Edema present.     Comments: Left lower extremity significantly swollen, erythematous, and warm to touch.  See image.  Skin:    General: Skin is warm and dry.  Neurological:     General: No focal deficit present.     Mental Status: She is alert and oriented to person, place, and time.       Labs on Admission: I have personally reviewed following labs and imaging  studies  CBC: Recent Labs  Lab 08/13/21 1726  WBC 5.9  NEUTROABS 3.4  HGB 8.5*  HCT 29.3*  MCV 92.7  PLT 350   Basic Metabolic Panel: Recent Labs  Lab 08/13/21 1726  NA 142  K 4.0  CL 110  CO2 22  GLUCOSE 90  BUN 13  CREATININE 0.62  CALCIUM 8.1*   GFR: Estimated Creatinine Clearance: 87.7 mL/min (by C-G formula based on SCr of 0.62 mg/dL). Liver Function Tests: No results for input(s): AST, ALT, ALKPHOS, BILITOT, PROT, ALBUMIN in the last 168 hours. No results for input(s): LIPASE, AMYLASE in the last 168 hours. No results for input(s): AMMONIA in the last 168 hours. Coagulation Profile: No results for input(s): INR, PROTIME in the last 168 hours. Cardiac Enzymes: No results for input(s): CKTOTAL, CKMB, CKMBINDEX, TROPONINI in the last 168 hours. BNP (last 3 results) No results for input(s): PROBNP in the last 8760 hours. HbA1C: No results for input(s): HGBA1C in the last 72 hours. CBG: No results for input(s): GLUCAP in the last 168 hours. Lipid Profile: No results for input(s): CHOL, HDL, LDLCALC, TRIG, CHOLHDL, LDLDIRECT in the last 72 hours. Thyroid Function Tests: No results for input(s): TSH, T4TOTAL, FREET4, T3FREE, THYROIDAB in the last 72 hours. Anemia Panel: No results for input(s): VITAMINB12, FOLATE, FERRITIN, TIBC, IRON, RETICCTPCT in the last 72 hours. Urine analysis:    Component Value Date/Time   COLORURINE YELLOW 06/20/2017 1400   APPEARANCEUR CLEAR 06/20/2017 1400   LABSPEC 1.018 06/20/2017 1400   PHURINE 5.0 06/20/2017 1400   GLUCOSEU NEGATIVE 06/20/2017 1400   HGBUR MODERATE (A) 06/20/2017 1400   BILIRUBINUR NEGATIVE 06/20/2017 1400   KETONESUR NEGATIVE 06/20/2017 1400   PROTEINUR NEGATIVE 06/20/2017 1400   NITRITE NEGATIVE 06/20/2017 1400   LEUKOCYTESUR NEGATIVE 06/20/2017 1400    Radiological Exams on Admission: VAS [place] LOWER EXTREMITY VENOUS (DVT) (ONLY [facility])  Result Date: 08/13/2021  Lower Venous DVT Study Patient Name:   Marissa Gray  Date of Exam:   08/13/2021 Medical Rec #: [id number]        Accession #:    [id number] Date of Birth: 1981-11-07         Patient Gender: F Patient Age:   26 years Exam Location:  [facility] Procedure:  VAS [place] LOWER EXTREMITY VENOUS (DVT) Referring Phys: Parke Poisson --------------------------------------------------------------------------------  Indications: Pain, Swelling, and Erythema.  Risk Factors: None identified. Limitations: Body habitus, poor ultrasound/tissue interface and patient positioning, patient pain tolerance. Comparison Study: No prior studies. Performing Technologist: Chanda Busing RVT  Examination Guidelines: A complete evaluation includes B-mode imaging, spectral Doppler, color Doppler, and power Doppler as needed of all accessible portions of each vessel. Bilateral testing is considered an integral part of a complete examination. Limited examinations for reoccurring indications may be performed as noted. The reflux portion of the exam is performed with the patient in reverse Trendelenburg.  +-----+---------------+---------+-----------+----------+--------------+ RIGHTCompressibilityPhasicitySpontaneityPropertiesThrombus Aging +-----+---------------+---------+-----------+----------+--------------+ CFV  Full           Yes      Yes                                 +-----+---------------+---------+-----------+----------+--------------+   +---------+---------------+---------+-----------+----------+-------------------+ LEFT     CompressibilityPhasicitySpontaneityPropertiesThrombus Aging      +---------+---------------+---------+-----------+----------+-------------------+ CFV      None           No       No                   Acute               +---------+---------------+---------+-----------+----------+-------------------+ SFJ      None                                         Acute                +---------+---------------+---------+-----------+----------+-------------------+ FV Prox  None           No       No                   Acute               +---------+---------------+---------+-----------+----------+-------------------+ FV Mid                  No       No                   Acute               +---------+---------------+---------+-----------+----------+-------------------+ FV Distal               No       No                   Acute               +---------+---------------+---------+-----------+----------+-------------------+ PFV                                                   Not well visualized +---------+---------------+---------+-----------+----------+-------------------+ POP      None           No       No                   Acute               +---------+---------------+---------+-----------+----------+-------------------+ PTV      Full                                                             +---------+---------------+---------+-----------+----------+-------------------+  PERO                                                  Not well visualized +---------+---------------+---------+-----------+----------+-------------------+ GSV      None                                         Acute               +---------+---------------+---------+-----------+----------+-------------------+ EIV                     No       Yes                                      +---------+---------------+---------+-----------+----------+-------------------+ CIV                                                   Not visualized      +---------+---------------+---------+-----------+----------+-------------------+    Summary: RIGHT: - No evidence of common femoral vein obstruction.  LEFT: - Findings consistent with acute deep vein thrombosis involving the left common femoral vein, left femoral vein, and left popliteal vein. - Findings consistent with acute  superficial vein thrombosis involving the left great saphenous vein. - No cystic structure found in the popliteal fossa.  *See table(s) above for measurements and observations.    Preliminary     Assessment/Plan Principal Problem:   DVT (deep venous thrombosis) (HCC) Active Problems:   Down's syndrome   Rheumatoid arthritis involving multiple sites (HCC)   Gastroesophageal reflux disease without esophagitis   Hypothyroidism   Acute left lower extremity DVT Acute superficial vein thrombosis Possible left lower extremity cellulitis -Doppler showing acute DVT involving the left common femoral vein, left femoral vein, and left popliteal vein.  Also showing acute superficial vein thrombosis involving the left great saphenous vein. -Provoking factors include recent left hip surgery and OCP use. -Continue IV heparin -At the time of my evaluation, Dr. Myra Gianotti was at bedside.  He recommends treating with IV heparin and also IV antibiotics for possible cellulitis.  Vascular surgery will continue to follow and consider stent if there is no improvement. -IV vancomycin started -No clinical signs of PE at this time.  Not tachycardic or hypoxic.  Ordered telemetry monitoring and continuous pulse ox monitoring.  Addendum 08/13/2021 at 8:35 PM: Also on methotrexate for rheumatoid arthritis which could be a possible provoking factor.  Chronic anemia -Hemoglobin currently stable -Repeat CBC in a.m.  Abnormal lab Calcium 8.1 on BMP.   -Check albumin level to calculate corrected calcium level.  Down syndrome  Hypothyroidism Asthma: Stable.  No signs of acute exacerbation at this time. GERD Rheumatoid arthritis -Pharmacy med rec pending  DVT prophylaxis: Heparin  Code Status: Full code Family Communication: Mother at bedside. Disposition Plan: Status is: Inpatient  Remains inpatient appropriate because:Inpatient level of care appropriate due to severity of illness  Dispo: The patient is from:  Home  Anticipated d/c is to: Home              Patient currently is not medically stable to d/c.   Difficult to place patient No  Level of care: Level of care: Telemetry Medical  The medical decision making on this patient was of high complexity and the patient is at high risk for clinical deterioration, therefore this is a level 3 visit.  John Giovanni MD [facility]  If 7PM-7AM, please contact night-coverage [email or web address]  08/13/2021, 7:59 PM

## 2021-08-13 NOTE — Progress Notes (Signed)
ANTICOAGULATION CONSULT NOTE - Initial Consult  Pharmacy Consult for IV heparin Indication: new LLE DVT  Allergies  Allergen Reactions   Cefaclor Hives and Rash    Other reaction(s): Unknown   Penicillins Hives     Has patient had a PCN reaction causing immediate rash, facial/tongue/throat swelling, SOB or lightheadedness with hypotension: Unknown Has patient had a PCN reaction causing severe rash involving mucus membranes or skin necrosis: Unknown Has patient had a PCN reaction that required hospitalization: Unknown Has patient had a PCN reaction occurring within the last 10 years: Unknown If all of the above answers are "NO", then may proceed with Cephalosporin use. Other reaction(s): rash, Unknown   Sulfa Antibiotics Hives and Other (See Comments)    FEVER Other reaction(s): Unknown    Patient Measurements:   Heparin Dosing Weight: 63 kg (used [place] for initial dosing)  Vital Signs: Temp: 97.8 F (36.6 C) (08/17 1613) Temp Source: Oral (08/17 1613) BP: 139/81 (08/17 1613) Pulse Rate: 95 (08/17 1613)  Labs: Recent Labs    08/13/21 1726  HGB 8.5*  HCT 29.3*  PLT 350  CREATININE 0.62    Estimated Creatinine Clearance: 87.7 mL/min (by C-G formula based on SCr of 0.62 mg/dL).   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 Found to have LLE DVT. Pharmacy consulted to dose IV heparin; Vascular Surgery was also consulted for possible intervention.  Baseline INR, aPTT: not done Prior anticoagulation: none, completed postop Xarelto ppx in June  Significant events:  Today, 08/13/2021: CBC: Hgb low but improved from prior levels; seems to be < 10 since initial hip surgery, and more recently hovering around 7.5 g; Plt stable WNL SCr stable WNL No bleeding or infusion issues per nursing  Goal of Therapy: Heparin level 0.3-0.7 units/ml Monitor platelets by anticoagulation protocol: Yes  Plan: Heparin 2000 units IV bolus x 1 Heparin 1200 units/hr IV infusion Check heparin level 8 hrs after start Daily CBC, daily heparin level once stable Monitor for signs of bleeding or thrombosis F/u plans for long-term anticoagulation  Bernadene Person, PharmD, BCPS [phone] 08/13/2021, 8:13 PM

## 2021-08-14 DIAGNOSIS — I824Y2 Acute embolism and thrombosis of unspecified deep veins of left proximal lower extremity: Secondary | ICD-10-CM | POA: Diagnosis not present

## 2021-08-14 LAB — CBC
HCT: 25.8 % — ABNORMAL LOW (ref 36.0–46.0)
Hemoglobin: 7.5 g/dL — ABNORMAL LOW (ref 12.0–15.0)
MCH: 26.9 pg (ref 26.0–34.0)
MCHC: 29.1 g/dL — ABNORMAL LOW (ref 30.0–36.0)
MCV: 92.5 fL (ref 80.0–100.0)
Platelets: 328 10*3/uL (ref 150–400)
RBC: 2.79 MIL/uL — ABNORMAL LOW (ref 3.87–5.11)
RDW: 19.8 % — ABNORMAL HIGH (ref 11.5–15.5)
WBC: 4.3 10*3/uL (ref 4.0–10.5)
nRBC: 0.5 % — ABNORMAL HIGH (ref 0.0–0.2)

## 2021-08-14 LAB — CREATININE, SERUM
Creatinine, Ser: 0.9 mg/dL (ref 0.44–1.00)
GFR, Estimated: >60 mL/min (ref >60)

## 2021-08-14 LAB — HEPARIN LEVEL (UNFRACTIONATED)
Heparin Unfractionated: 0.21 IU/mL — ABNORMAL LOW (ref 0.30–0.70)
Heparin Unfractionated: <0.1 IU/mL — ABNORMAL LOW (ref 0.30–0.70)
Heparin Unfractionated: 0.72 IU/mL — ABNORMAL HIGH (ref 0.30–0.70)

## 2021-08-14 LAB — COMPREHENSIVE METABOLIC PANEL
ALT: 17 U/L (ref 0–44)
AST: 16 U/L (ref 15–41)
Albumin: 2.7 g/dL — ABNORMAL LOW (ref 3.5–5.0)
Alkaline Phosphatase: 68 U/L (ref 38–126)
Anion gap: 7 (ref 5–15)
BUN: 13 mg/dL (ref 6–20)
CO2: 22 mmol/L (ref 22–32)
Calcium: 8.2 mg/dL — ABNORMAL LOW (ref 8.9–10.3)
Chloride: 111 mmol/L (ref 98–111)
Creatinine, Ser: 0.64 mg/dL (ref 0.44–1.00)
GFR, Estimated: >60 mL/min (ref >60)
Glucose, Bld: 84 mg/dL (ref 70–99)
Potassium: 3.8 mmol/L (ref 3.5–5.1)
Sodium: 140 mmol/L (ref 135–145)
Total Bilirubin: 0.4 mg/dL (ref 0.3–1.2)
Total Protein: 7.1 g/dL (ref 6.5–8.1)

## 2021-08-14 MED ORDER — ACETAMINOPHEN 500 MG PO TABS: 500.0000 mg | ORAL_TABLET | Freq: Three times a day (TID) | ORAL | Status: DC | PRN

## 2021-08-14 MED ORDER — VITAMIN C 500 MG PO CHEW: 1.0000 | CHEWABLE_TABLET | Freq: Every day | ORAL | Status: DC

## 2021-08-14 MED ORDER — ALBUTEROL SULFATE HFA 108 (90 BASE) MCG/ACT IN AERS: 2.0000 | INHALATION_SPRAY | Freq: Four times a day (QID) | RESPIRATORY_TRACT | Status: DC | PRN

## 2021-08-14 MED ORDER — FOLIC ACID 1 MG PO TABS
3.0000 mg | ORAL_TABLET | Freq: Every morning | ORAL | Status: DC
  Administered 2021-08-14 – 2021-08-22 (×9): 3 mg via ORAL
  Filled 2021-08-14 (×8): qty 3

## 2021-08-14 MED ORDER — VITAMIN D 50 MCG (2000 UT) PO TABS: 2000.0000 [IU] | ORAL_TABLET | Freq: Every day | ORAL | Status: DC

## 2021-08-14 MED ORDER — CALCIUM CARBONATE 600 MG PO TABS: 600.0000 mg | ORAL_TABLET | Freq: Every day | ORAL | Status: DC

## 2021-08-14 MED ORDER — PANTOPRAZOLE SODIUM 40 MG PO TBEC
40.0000 mg | DELAYED_RELEASE_TABLET | Freq: Every day | ORAL | Status: DC
  Administered 2021-08-14 – 2021-08-22 (×9): 40 mg via ORAL
  Filled 2021-08-14 (×10): qty 1

## 2021-08-14 MED ORDER — KETOTIFEN FUMARATE 0.025 % OP SOLN
1.0000 [drp] | Freq: Two times a day (BID) | OPHTHALMIC | Status: DC
  Administered 2021-08-14 – 2021-08-22 (×16): 1 [drp] via OPHTHALMIC
  Filled 2021-08-14: qty 5

## 2021-08-14 MED ORDER — MONTELUKAST SODIUM 10 MG PO TABS
10.0000 mg | ORAL_TABLET | Freq: Every day | ORAL | Status: DC
  Administered 2021-08-14 – 2021-08-21 (×8): 10 mg via ORAL
  Filled 2021-08-14 (×8): qty 1

## 2021-08-14 MED ORDER — ALBUTEROL SULFATE (2.5 MG/3ML) 0.083% IN NEBU: 2.5000 mg | INHALATION_SOLUTION | Freq: Four times a day (QID) | RESPIRATORY_TRACT | Status: DC | PRN

## 2021-08-14 MED ORDER — ASCORBIC ACID 500 MG PO TABS
500.0000 mg | ORAL_TABLET | Freq: Every day | ORAL | Status: DC
  Administered 2021-08-15 – 2021-08-22 (×8): 500 mg via ORAL
  Filled 2021-08-14 (×9): qty 1

## 2021-08-14 MED ORDER — FERROUS SULFATE 325 (65 FE) MG PO TABS
325.0000 mg | ORAL_TABLET | Freq: Every day | ORAL | Status: DC
  Administered 2021-08-15 – 2021-08-22 (×8): 325 mg via ORAL
  Filled 2021-08-14 (×8): qty 1

## 2021-08-14 MED ORDER — VITAMIN D 25 MCG (1000 UNIT) PO TABS
2000.0000 [IU] | ORAL_TABLET | Freq: Every day | ORAL | Status: DC
  Administered 2021-08-15 – 2021-08-22 (×8): 2000 [IU] via ORAL
  Filled 2021-08-14 (×8): qty 2

## 2021-08-14 MED ORDER — BUDESONIDE 0.25 MG/2ML IN SUSP
0.2500 mg | Freq: Two times a day (BID) | RESPIRATORY_TRACT | Status: DC
  Administered 2021-08-15 – 2021-08-22 (×15): 0.25 mg via RESPIRATORY_TRACT
  Filled 2021-08-14 (×15): qty 2

## 2021-08-14 MED ORDER — LEVOTHYROXINE SODIUM 75 MCG PO TABS
75.0000 ug | ORAL_TABLET | Freq: Every day | ORAL | Status: DC
  Administered 2021-08-15 – 2021-08-22 (×8): 75 ug via ORAL
  Filled 2021-08-14 (×8): qty 1

## 2021-08-14 MED ORDER — MICONAZOLE NITRATE 2 % EX POWD: 1.0000 "application " | Freq: Two times a day (BID) | CUTANEOUS | Status: DC

## 2021-08-14 MED ORDER — MICONAZOLE NITRATE POWD
Freq: Two times a day (BID) | Status: DC
  Filled 2021-08-14: qty 100

## 2021-08-14 MED ORDER — FERROUS SULFATE ER 142 (45 FE) MG PO TBCR: 1.0000 | EXTENDED_RELEASE_TABLET | Freq: Every day | ORAL | Status: DC

## 2021-08-14 MED ORDER — CALCIUM CARBONATE 1250 (500 CA) MG PO TABS
1.0000 | ORAL_TABLET | Freq: Every day | ORAL | Status: DC
  Administered 2021-08-15 – 2021-08-22 (×8): 500 mg via ORAL
  Filled 2021-08-14 (×9): qty 1

## 2021-08-14 MED ORDER — FLUTICASONE PROPIONATE HFA 110 MCG/ACT IN AERO: 1.0000 | INHALATION_SPRAY | Freq: Two times a day (BID) | RESPIRATORY_TRACT | Status: DC

## 2021-08-14 MED ORDER — DOCUSATE SODIUM 100 MG PO CAPS
100.0000 mg | ORAL_CAPSULE | Freq: Two times a day (BID) | ORAL | Status: DC
  Administered 2021-08-14 – 2021-08-22 (×16): 100 mg via ORAL
  Filled 2021-08-14 (×18): qty 1

## 2021-08-14 MED ORDER — HEPARIN BOLUS VIA INFUSION
2000.0000 [IU] | Freq: Once | INTRAVENOUS | Status: AC
  Administered 2021-08-14: 2000 [IU] via INTRAVENOUS
  Filled 2021-08-14: qty 2000

## 2021-08-14 NOTE — ED Notes (Signed)
[facility] at bedside for transport. 

## 2021-08-14 NOTE — ED Notes (Signed)
Report called to [place], RN on [address] at [facility] [employer].  Will call [facility] to arrange transport.

## 2021-08-14 NOTE — Progress Notes (Addendum)
ANTICOAGULATION CONSULT NOTE    Pharmacy Consult for IV heparin Indication: new LLE DVT  Allergies  Allergen Reactions   Cefaclor Hives and Rash    Other reaction(s): Unknown   Penicillins Hives     Has patient had a PCN reaction causing immediate rash, facial/tongue/throat swelling, SOB or lightheadedness with hypotension: Unknown Has patient had a PCN reaction causing severe rash involving mucus membranes or skin necrosis: Unknown Has patient had a PCN reaction that required hospitalization: Unknown Has patient had a PCN reaction occurring within the last 10 years: Unknown If all of the above answers are "NO", then may proceed with Cephalosporin use. Other reaction(s): rash, Unknown   Sulfa Antibiotics Hives and Other (See Comments)    FEVER Other reaction(s): Unknown    Patient Measurements:   Heparin Dosing Weight: 63 kg (used [place] for initial dosing)  Vital Signs: BP: 99/58 (08/18 0555) Pulse Rate: 83 (08/18 0555)  Labs: Recent Labs    08/13/21 1726 08/14/21 0529  HGB 8.5* 7.5*  HCT 29.3* 25.8*  PLT 350 328  HEPARINUNFRC  --  0.21*  CREATININE 0.62  --      Estimated Creatinine Clearance: 87.7 mL/min (by C-G formula based on SCr of 0.62 mg/dL).   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 Found to have LLE DVT.  Pharmacy consulted to dose IV heparin; Vascular Surgery was also consulted for possible intervention.  Baseline INR, aPTT: not done Prior anticoagulation: none, completed postop Xarelto ppx in June  Significant events:  Today, 08/14/2021: CBC: Hgb 7.5, stable but low  Plt stable WNL SCr stable WNL No bleeding or infusion issues noted  Goal of Therapy: Heparin level 0.3-0.7 units/ml Monitor platelets by anticoagulation protocol: Yes  Plan: Increase Heparin gtt to 1400 units/hr  Check heparin level 6 hrs after heparin rate increase  Daily CBC, daily heparin level once stable Monitor for signs of bleeding or thrombosis F/u plans for long-term anticoagulation  Terrilee Files, PharmD 08/14/2021, 6:25 AM

## 2021-08-14 NOTE — Progress Notes (Signed)
ANTICOAGULATION CONSULT NOTE    Pharmacy Consult for IV heparin Indication: new LLE DVT  Patient Measurements:   Heparin Dosing Weight: 63 kg (used [place] for initial dosing)  Vital Signs: Temp: 97.5 F (36.4 C) (08/18 1500) Temp Source: Oral (08/18 1500) BP: 122/64 (08/18 1500) Pulse Rate: 74 (08/18 1300)  Labs: Recent Labs    08/13/21 1726 08/14/21 0529 08/14/21 1421  HGB 8.5* 7.5*  --   HCT 29.3* 25.8*  --   PLT 350 328  --   HEPARINUNFRC  --  0.21* <0.10*  CREATININE 0.62 0.64  --     Estimated Creatinine Clearance: 87.7 mL/min (by C-G formula based on SCr of 0.64 mg/dL).  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 Found to have LLE DVT. Pharmacy consulted to dose IV heparin; Vascular Surgery was also consulted for possible intervention.  Baseline INR, aPTT: not done Prior anticoagulation: none, completed postop Xarelto ppx in June  Today, 08/14/2021: CBC: Hgb 7.5, stable but low  Plt stable WNL No bleeding or infusion issues noted 0529 Hep level 0.21, below desired range, increased Heparin gtt to 1400 units/hr  1421 Hep level < 0.10  Goal of Therapy: Heparin level 0.3-0.7 units/ml Monitor platelets by anticoagulation protocol: Yes  Plan: Heparin bolus of 2000 units and increase Heparin gtt to 1600 units/hr  Check heparin level 6 hrs after heparin rate increase  Daily CBC, daily  heparin level once stable Monitor for signs of bleeding or thrombosis F/u plans for long-term anticoagulation  Otho Bellows PharmD [facility] [phone] 08/14/2021, 4:11 PM

## 2021-08-14 NOTE — ED Notes (Signed)
Attempted to call report to [facility] [place]- the nurse stated that she is unable to take report because she is putting in a foley.  "I will call you back"

## 2021-08-14 NOTE — ED Notes (Signed)
Patient continues to wait for admission bed assignment.  No distress noted

## 2021-08-14 NOTE — ED Notes (Signed)
Transportation via [facility] has been arranged

## 2021-08-14 NOTE — Progress Notes (Signed)
PROGRESS NOTE    Marissa Gray  ZDG:[id number] DOB: 11-30-1981 DOA: 08/13/2021 PCP: Merri Brunette, MD   Brief Narrative:  HPI: Marissa Gray is a 40 y.o. 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  She presents to the ED today for evaluation of left flank swelling, erythema, and warmth.  In the ED, vital signs stable.  CBC showing WBC 5.9, hemoglobin 8.5 (stable), platelet count 350k.  Differential pending.  BMP showing sodium 142, potassium 4.0, chloride 110, bicarb 22, BUN 13, creatinine 0.6, glucose 90, calcium 8.1.  Screening COVID and influenza PCR pending.  Doppler showing acute DVT involving the left common femoral vein, left femoral vein, and left popliteal vein.  Also showing acute superficial vein thrombosis involving the left great saphenous vein.  ED PA spoke to Dr. Myra Gianotti from vascular surgery who requested admission at [facility] by hospitalist service.  He will see the patient tonight and recommended keeping her n.p.o. until then.  Pharmacy consulted for IV heparin.   History provided by patient and her mother at bedside.  Reporting 1 day history of left leg swelling, erythema, and warmth.  Patient is also experiencing slight discomfort in her leg due to the swelling but no significant pain.  Per mother, no prior history of blood clots.  Patient denies dizziness/lightheadedness, palpitations, or shortness of breath.  Denies any syncopal episodes.  Denies fevers.  She takes methotrexate for rheumatoid arthritis and also takes an oral contraceptive pill.  Reports history of left hip surgery back in April and May.  No other complaints.    Assessment & Plan:   Principal Problem:   DVT (deep venous thrombosis) (HCC) Active Problems:   Down's syndrome   Rheumatoid arthritis  involving multiple sites (HCC)   Gastroesophageal reflux disease without esophagitis   Hypothyroidism   Acute left lower extremity DVT/Acute superficial vein thrombophlebitis/Possible left lower extremity cellulitis: Provoking factors include recent left hip surgery, methotrexate and OCP use.  Feels better than yesterday.  Erythema and edema also improved.  Evaluated by vascular surgery, plan for thrombectomy at some point in time.  Continue heparin in the meantime as well as vancomycin.  Chronic anemia: Hemoglobin is stable.  Monitor.  Hypothyroidism: Resume Synthroid.  Asthma: Stable.  No signs of acute exacerbation at this time.  Resume home medications.  Resume home medications.  GERD: Resume PPI.  Rheumatoid arthritis: Hold methotrexate for now.  DVT prophylaxis:    Code Status: Full Code  Family Communication: Mother none present at bedside.  Plan of care discussed with patient and her mother.  Status is: Inpatient  Remains inpatient appropriate because:Ongoing diagnostic testing needed not appropriate for outpatient work up  Dispo: The patient is from: Group Home              Anticipated d/c is to: Group home              Patient currently is not medically stable to d/c.   Difficult to place patient No        Estimated body mass index is 37.28 kg/m as calculated from the following:   Height as of 08/06/21: 4\' 11"  (1.499 m).   Weight as of 08/06/21: 83.7 kg.     Nutritional Assessment: There is no height or weight on file to calculate BMI.. Seen by dietician.  I  agree with the assessment and plan as outlined below: Nutrition Status:        .  Skin Assessment: I have examined the patient's skin and I agree with the wound assessment as performed by the wound care RN as outlined below:    Consultants:  Vascular surgery  Procedures:  None  Antimicrobials:  Anti-infectives (From admission, onward)    Start     Dose/Rate Route Frequency Ordered Stop    08/14/21 2200  vancomycin (VANCOREADY) IVPB 1250 mg/250 mL        1,250 mg 166.7 mL/hr over 90 Minutes Intravenous Every 24 hours 08/13/21 2204     08/13/21 2200  clindamycin (CLEOCIN) IVPB 600 mg  Status:  Discontinued        600 mg 100 mL/hr over 30 Minutes Intravenous Every 8 hours 08/13/21 2024 08/13/21 2031   08/13/21 2030  vancomycin (VANCOREADY) IVPB 2000 mg/400 mL        2,000 mg 200 mL/hr over 120 Minutes Intravenous  Once 08/13/21 2024 08/14/21 0230          Subjective: Seen and examined.  Mother at the bedside.  Patient has no complaint.  She is excited about the fact that her leg looks better today.  Objective: Vitals:   08/14/21 0758 08/14/21 0900 08/14/21 0930 08/14/21 1000  BP: 112/65 114/62  108/66  Pulse: 81 79 79 88  Resp: 20 18    Temp: 97.8 F (36.6 C)     TempSrc: Oral     SpO2: 100% 94% 93% 100%    Intake/Output Summary (Last 24 hours) at 08/14/2021 1026 Last data filed at 08/14/2021 0230 Gross per 24 hour  Intake 400 ml  Output --  Net 400 ml   There were no vitals filed for this visit.  Examination:  General exam: Appears calm and comfortable  Respiratory system: Clear to auscultation. Respiratory effort normal. Cardiovascular system: S1 & S2 heard, RRR. No JVD, murmurs, rubs, gallops or clicks.  Left lower extremity significantly edematous than the right lower extremity with erythema and very mild calf tenderness. Gastrointestinal system: Abdomen is nondistended, soft and nontender. No organomegaly or masses felt. Normal bowel sounds heard. Central nervous system: Alert and oriented. No focal neurological deficits. Extremities: Symmetric 5 x 5 power. Skin: No rashes, lesions or ulcers Psychiatry: Judgement and insight appear normal. Mood & affect appropriate.    Data Reviewed: I have personally reviewed following labs and imaging studies  CBC: Recent Labs  Lab 08/13/21 1726 08/14/21 0529  WBC 5.9 4.3  NEUTROABS 3.4  --   HGB 8.5* 7.5*   HCT 29.3* 25.8*  MCV 92.7 92.5  PLT 350 328   Basic Metabolic Panel: Recent Labs  Lab 08/13/21 1726 08/14/21 0529  NA 142 140  K 4.0 3.8  CL 110 111  CO2 22 22  GLUCOSE 90 84  BUN 13 13  CREATININE 0.62 0.64  CALCIUM 8.1* 8.2*   GFR: Estimated Creatinine Clearance: 87.7 mL/min (by C-G formula based on SCr of 0.64 mg/dL). Liver Function Tests: Recent Labs  Lab 08/14/21 0529  AST 16  ALT 17  ALKPHOS 68  BILITOT 0.4  PROT 7.1  ALBUMIN 2.7*   No results for input(s): LIPASE, AMYLASE in the last 168 hours. No results for input(s): AMMONIA in the last 168 hours. Coagulation Profile: No results for input(s): INR, PROTIME in the last 168 hours. Cardiac Enzymes: No results for input(s): CKTOTAL, CKMB, CKMBINDEX, TROPONINI in the last 168 hours. BNP (last  3 results) No results for input(s): PROBNP in the last 8760 hours. HbA1C: No results for input(s): HGBA1C in the last 72 hours. CBG: No results for input(s): GLUCAP in the last 168 hours. Lipid Profile: No results for input(s): CHOL, HDL, LDLCALC, TRIG, CHOLHDL, LDLDIRECT in the last 72 hours. Thyroid Function Tests: No results for input(s): TSH, T4TOTAL, FREET4, T3FREE, THYROIDAB in the last 72 hours. Anemia Panel: No results for input(s): VITAMINB12, FOLATE, FERRITIN, TIBC, IRON, RETICCTPCT in the last 72 hours. Sepsis Labs: No results for input(s): PROCALCITON, LATICACIDVEN in the last 168 hours.  Recent Results (from the past 240 hour(s))  Resp Panel by RT-PCR (Flu A&B, Covid) Nasopharyngeal Swab     Status: None   Collection Time: 08/13/21 10:24 PM   Specimen: Nasopharyngeal Swab; Nasopharyngeal(NP) swabs in vial transport medium  Result Value Ref Range Status   SARS Coronavirus 2 by RT PCR NEGATIVE NEGATIVE Final    Comment: (NOTE) SARS-CoV-2 target nucleic acids are NOT DETECTED.  The SARS-CoV-2 RNA is generally detectable in upper respiratory specimens during the acute phase of infection. The  lowest concentration of SARS-CoV-2 viral copies this assay can detect is 138 copies/mL. A negative result does not preclude SARS-Cov-2 infection and should not be used as the sole basis for treatment or other patient management decisions. A negative result may occur with  improper specimen collection/handling, submission of specimen other than nasopharyngeal swab, presence of viral mutation(s) within the areas targeted by this assay, and inadequate number of viral copies(<138 copies/mL). A negative result must be combined with clinical observations, patient history, and epidemiological information. The expected result is Negative.  Fact Sheet for Patients:  BloggerCourse.com  Fact Sheet for Healthcare Providers:  SeriousBroker.it  This test is no t yet approved or cleared by the [place] FDA and  has been authorized for detection and/or diagnosis of SARS-CoV-2 by FDA under an Emergency Use Authorization (EUA). This EUA will remain  in effect (meaning this test can be used) for the duration of the COVID-19 declaration under Section 564(b)(1) of the Act, 21 U.S.C.section 360bbb-3(b)(1), unless the authorization is terminated  or revoked sooner.       Influenza A by PCR NEGATIVE NEGATIVE Final   Influenza B by PCR NEGATIVE NEGATIVE Final    Comment: (NOTE) The Xpert Xpress SARS-CoV-2/FLU/RSV plus assay is intended as an aid in the diagnosis of influenza from Nasopharyngeal swab specimens and should not be used as a sole basis for treatment. Nasal washings and aspirates are unacceptable for Xpert Xpress SARS-CoV-2/FLU/RSV testing.  Fact Sheet for Patients: BloggerCourse.com  Fact Sheet for Healthcare Providers: SeriousBroker.it  This test is not yet approved or cleared by the [place] FDA and has been authorized for detection and/or diagnosis of SARS-CoV-2 by FDA under  an Emergency Use Authorization (EUA). This EUA will remain in effect (meaning this test can be used) for the duration of the COVID-19 declaration under Section 564(b)(1) of the Act, 21 U.S.C. section 360bbb-3(b)(1), unless the authorization is terminated or revoked.  Performed at [facility], [address] [place], [place] [address]       Radiology Studies: VAS [place] LOWER EXTREMITY VENOUS (DVT) (ONLY [facility] & [facility])  Result Date: 08/13/2021  Lower Venous DVT Study Patient Name:  THEADORA NOYES  Date of Exam:   08/13/2021 Medical Rec #: [id number]        Accession #:    [id number] Date of Birth: 1981-06-04  Patient Gender: F Patient Age:   74 years Exam Location:  [facility] Procedure:      VAS [place] LOWER EXTREMITY VENOUS (DVT) Referring Phys: Parke Poisson --------------------------------------------------------------------------------  Indications: Pain, Swelling, and Erythema.  Risk Factors: None identified. Limitations: Body habitus, poor ultrasound/tissue interface and patient positioning, patient pain tolerance. Comparison Study: No prior studies. Performing Technologist: Chanda Busing RVT  Examination Guidelines: A complete evaluation includes B-mode imaging, spectral Doppler, color Doppler, and power Doppler as needed of all accessible portions of each vessel. Bilateral testing is considered an integral part of a complete examination. Limited examinations for reoccurring indications may be performed as noted. The reflux portion of the exam is performed with the patient in reverse Trendelenburg.  +-----+---------------+---------+-----------+----------+--------------+ RIGHTCompressibilityPhasicitySpontaneityPropertiesThrombus Aging +-----+---------------+---------+-----------+----------+--------------+ CFV  Full           Yes      Yes                                 +-----+---------------+---------+-----------+----------+--------------+    +---------+---------------+---------+-----------+----------+-------------------+ LEFT     CompressibilityPhasicitySpontaneityPropertiesThrombus Aging      +---------+---------------+---------+-----------+----------+-------------------+ CFV      None           No       No                   Acute               +---------+---------------+---------+-----------+----------+-------------------+ SFJ      None                                         Acute               +---------+---------------+---------+-----------+----------+-------------------+ FV Prox  None           No       No                   Acute               +---------+---------------+---------+-----------+----------+-------------------+ FV Mid                  No       No                   Acute               +---------+---------------+---------+-----------+----------+-------------------+ FV Distal               No       No                   Acute               +---------+---------------+---------+-----------+----------+-------------------+ PFV                                                   Not well visualized +---------+---------------+---------+-----------+----------+-------------------+ POP      None           No       No                   Acute               +---------+---------------+---------+-----------+----------+-------------------+  PTV      Full                                                             +---------+---------------+---------+-----------+----------+-------------------+ PERO                                                  Not well visualized +---------+---------------+---------+-----------+----------+-------------------+ GSV      None                                         Acute               +---------+---------------+---------+-----------+----------+-------------------+ EIV                     No       Yes                                       +---------+---------------+---------+-----------+----------+-------------------+ CIV                                                   Not visualized      +---------+---------------+---------+-----------+----------+-------------------+     Summary: RIGHT: - No evidence of common femoral vein obstruction.  LEFT: - Findings consistent with acute deep vein thrombosis involving the left common femoral vein, left femoral vein, and left popliteal vein. - Findings consistent with acute superficial vein thrombosis involving the left great saphenous vein. - No cystic structure found in the popliteal fossa.  *See table(s) above for measurements and observations. Electronically signed by Coral Else MD on 08/13/2021 at 10:42:01 PM.    Final     Scheduled Meds: Continuous Infusions:  heparin 1,400 Units/hr (08/14/21 0647)   vancomycin       LOS: 1 day   Time spent: 35 minutes   Hughie Closs, MD [facility]  08/14/2021, 10:26 AM   How to contact the [facility] Attending or Consulting provider 7A - 7P or covering provider during after hours 7P -7A, for this patient?  Check the care team in [facility] and look for a) attending/consulting [facility] provider listed and b) the [facility] team listed. Page or secure chat 7A-7P. Log into [email or web address] and use [facility]'s universal password to access. If you do not have the password, please contact the hospital operator. Locate the [facility] provider you are looking for under [facility] and page to a number that you can be directly reached. If you still have difficulty reaching the provider, please page the [facility] (Director on Call) for the Hospitalists listed on amion for assistance.

## 2021-08-14 NOTE — Progress Notes (Signed)
ANTICOAGULATION CONSULT NOTE    Pharmacy Consult for IV heparin Indication: new LLE DVT  Patient Measurements:   Heparin Dosing Weight: 63 kg (used [place] for initial dosing)  Vital Signs: Temp: 97.6 F (36.4 C) (08/18 2102) Temp Source: Oral (08/18 2102) BP: 120/56 (08/18 2102) Pulse Rate: 86 (08/18 2102)  Labs: Recent Labs    08/13/21 1726 08/14/21 0529 08/14/21 1421 08/14/21 2227  HGB 8.5* 7.5*  --   --   HCT 29.3* 25.8*  --   --   PLT 350 328  --   --   HEPARINUNFRC  --  0.21* <0.10* 0.72*  CREATININE 0.62 0.64  --  0.90    Estimated Creatinine Clearance: 77.9 mL/min (by C-G formula based on SCr of 0.9 mg/dL).  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 Found to have LLE DVT. Pharmacy consulted to dose IV heparin; Vascular Surgery was also consulted for possible intervention.  Heparin level slightly supratherapeutic (0.72) on gtt at 1600 units/hr. No bleeding noted.  Goal of Therapy: Heparin level 0.3-0.7 units/ml Monitor platelets by anticoagulation protocol: Yes  Plan: Decrease heparin slightly to 1550 units/hr F/u 6 hr heparin level  Christoper Fabian, PharmD, BCPS Please see amion for complete clinical pharmacist phone list 08/14/2021, 11:08 PM

## 2021-08-15 DIAGNOSIS — L03116 Cellulitis of left lower limb: Secondary | ICD-10-CM

## 2021-08-15 DIAGNOSIS — I824Y2 Acute embolism and thrombosis of unspecified deep veins of left proximal lower extremity: Secondary | ICD-10-CM | POA: Diagnosis not present

## 2021-08-15 LAB — IRON AND TIBC
Iron: 119 ug/dL (ref 28–170)
Saturation Ratios: 33 % — ABNORMAL HIGH (ref 10.4–31.8)
TIBC: 358 ug/dL (ref 250–450)
UIBC: 239 ug/dL

## 2021-08-15 LAB — CBC
HCT: 25.5 % — ABNORMAL LOW (ref 36.0–46.0)
Hemoglobin: 7.6 g/dL — ABNORMAL LOW (ref 12.0–15.0)
MCH: 27.1 pg (ref 26.0–34.0)
MCHC: 29.8 g/dL — ABNORMAL LOW (ref 30.0–36.0)
MCV: 91.1 fL (ref 80.0–100.0)
Platelets: 352 10*3/uL (ref 150–400)
RBC: 2.8 MIL/uL — ABNORMAL LOW (ref 3.87–5.11)
RDW: 19.9 % — ABNORMAL HIGH (ref 11.5–15.5)
WBC: 4.6 10*3/uL (ref 4.0–10.5)
nRBC: 0 % (ref 0.0–0.2)

## 2021-08-15 LAB — HEPARIN LEVEL (UNFRACTIONATED)
Heparin Unfractionated: 0.69 IU/mL (ref 0.30–0.70)
Heparin Unfractionated: 0.34 IU/mL (ref 0.30–0.70)

## 2021-08-15 LAB — VITAMIN B12: Vitamin B-12: 238 pg/mL (ref 180–914)

## 2021-08-15 LAB — FERRITIN: Ferritin: 11 ng/mL (ref 11–307)

## 2021-08-15 LAB — MRSA NEXT GEN BY PCR, NASAL: MRSA by PCR Next Gen: NOT DETECTED

## 2021-08-15 NOTE — Progress Notes (Addendum)
ANTICOAGULATION CONSULT NOTE    Pharmacy Consult for IV heparin Indication: new LLE DVT  Patient Measurements: Height 4 ft 11 inches Actual Weight 83.7 kg Heparin Dosing Weight: 63 kg (used [place] for initial dosing)  Vital Signs: Temp: 97.6 F (36.4 C) (08/19 0357) Temp Source: Oral (08/19 0357) BP: 118/65 (08/19 0357) Pulse Rate: 75 (08/19 0757)  Labs: Recent Labs    08/13/21 1726 08/13/21 1726 08/14/21 0529 08/14/21 1421 08/14/21 2227 08/15/21 0630  HGB 8.5*  --  7.5*  --   --  7.6*  HCT 29.3*  --  25.8*  --   --  25.5*  PLT 350  --  328  --   --  352  HEPARINUNFRC  --    < > 0.21* <0.10* 0.72* 0.69  CREATININE 0.62  --  0.64  --  0.90  --    < > = values in this interval not displayed.    Estimated Creatinine Clearance: 77.9 mL/min (by C-G formula based on SCr of 0.9 mg/dL).  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 Found to have LLE DVT. Pharmacy consulted to dose IV heparin; Vascular Surgery was also consulted for possible intervention.  Heparin level is therapeutic (0.69) on heparin drip 1550 units/hr.  Hgb 7.6 from 7.5 yesterday. PLTC remains within normal.  No bleeding noted.  Goal of Therapy: Heparin level 0.3-0.7 units/ml Monitor platelets by anticoagulation protocol: Yes  Plan: Continue Heparin IV drip at 1550 units/hr Check 6-8 hour heparin level to confirm remains in  theratpeutic range. Daily heparin level and CBC Monitor for signs and symptoms of bleeding  Noah Delaine, RPh Clinical Pharmacist [phone] Please see amion for complete clinical pharmacist phone list 08/15/2021, 11:51 AM

## 2021-08-15 NOTE — Progress Notes (Signed)
    Subjective  -  No complaints this morning   Physical Exam:  Erythema to left leg is significantly improved Slightly decreased edema to left leg Nonlabored breathing       Assessment/Plan:   Left leg DVT: The patient's cellulitis has significantly improved.  I would still like to give her another few days of IV antibiotics before proceeding with mechanical thrombectomy.  She will need to remain on IV heparin.  I have her tentatively scheduled for Tuesday or Wednesday.  This will need to be done under general anesthesia and so OR availability will determine whether I do this on Tuesday or Wednesday.  I will follow-up on Monday.  Wells Ketra Duchesne 08/15/2021 7:53 AM --  Vitals:   08/15/21 0007 08/15/21 0357  BP:  118/65  Pulse:  75  Resp:  20  Temp:  97.6 F (36.4 C)  SpO2: 100% 94%   No intake or output data in the 24 hours ending 08/15/21 0753   Laboratory CBC    Component Value Date/Time   WBC 4.6 08/15/2021 0630   HGB 7.6 (L) 08/15/2021 0630   HCT 25.5 (L) 08/15/2021 0630   PLT 352 08/15/2021 0630    BMET    Component Value Date/Time   NA 140 08/14/2021 0529   NA 139 07/30/2017 0000   K 3.8 08/14/2021 0529   CL 111 08/14/2021 0529   CO2 22 08/14/2021 0529   GLUCOSE 84 08/14/2021 0529   BUN 13 08/14/2021 0529   BUN 18 07/30/2017 0000   CREATININE 0.90 08/14/2021 2227   CALCIUM 8.2 (L) 08/14/2021 0529   CALCIUM 7.5 (L) 06/22/2017 0607   GFRNONAA >60 08/14/2021 2227   GFRAA >60 06/24/2017 0623    COAG Lab Results  Component Value Date   INR 1.2 05/18/2021   INR 1.0 04/14/2021   No results found for: PTT  Antibiotics Anti-infectives (From admission, onward)    Start     Dose/Rate Route Frequency Ordered Stop   08/14/21 2200  vancomycin (VANCOREADY) IVPB 1250 mg/250 mL        1,250 mg 166.7 mL/hr over 90 Minutes Intravenous Every 24 hours 08/13/21 2204     08/13/21 2200  clindamycin (CLEOCIN) IVPB 600 mg  Status:  Discontinued        600  mg 100 mL/hr over 30 Minutes Intravenous Every 8 hours 08/13/21 2024 08/13/21 2031   08/13/21 2030  vancomycin (VANCOREADY) IVPB 2000 mg/400 mL        2,000 mg 200 mL/hr over 120 Minutes Intravenous  Once 08/13/21 2024 08/14/21 0230        V. Charlena Cross, M.D., [facility] [place] Office: [phone] Pager:  [phone]

## 2021-08-15 NOTE — Progress Notes (Addendum)
Progress Note    Marissa Gray  ZOX:[id number] DOB: [birth year]  DOA: 08/13/2021 PCP: Merri Brunette, MD      Brief Narrative:    Medical records reviewed and are as summarized below:  Marissa Gray is a 40 y.o. 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  She presented to [facility] with pain, swelling and redness of the left lower extremity.  She was admitted to the hospital for acute left lower extremity DVT and acute left lower extremity cellulitis.  She was treated with IV heparin, IV antibiotics and analgesics.  She was transferred to [facility] for thrombectomy.     Assessment/Plan:   Principal Problem:   DVT (deep venous thrombosis) (HCC) Active Problems:   Cellulitis of left lower extremity   Down's syndrome   Rheumatoid arthritis involving multiple sites (HCC)   Gastroesophageal reflux disease without esophagitis   Hypothyroidism   Acute left lower extremity DVT, acute superficial femoral vein thrombophlebitis: Continue IV heparin infusion.  Monitor heparin level per protocol.  Plan for thrombectomy by vascular surgeon next week.  Acute left lower extremity cellulitis: Continue IV vancomycin analgesics as needed for pain.  Chronic anemia: Drop in hemoglobin from 8.5-7.6.  No indication for blood transfusion at this time.  Monitor H&H closely.  Check iron panel and vitamin B12 level.  Other comorbidities include hypothyroidism, asthma, rheumatoid arthritis, Down's syndrome    Diet Order             Diet Heart Room service appropriate? Yes; Fluid consistency: Thin  Diet effective now                      Consultants: Vascular surgeon  Procedures: None    Medications:    vitamin C  500 mg Oral Daily   budesonide  (PULMICORT) nebulizer solution  0.25 mg Nebulization BID   calcium carbonate  1 tablet Oral Q breakfast   cholecalciferol  2,000 Units Oral Daily   docusate sodium  100 mg Oral BID   ferrous sulfate  325 mg Oral Q breakfast   folic acid  3 mg Oral q AM   ketotifen  1 drop Both Eyes BID   levothyroxine  75 mcg Oral QAC breakfast   miconazole nitrate   Topical BID   montelukast  10 mg Oral QHS   pantoprazole  40 mg Oral Daily   Continuous Infusions:  heparin 1,550 Units/hr (08/15/21 1007)   vancomycin Stopped (08/15/21 0105)     Anti-infectives (From admission, onward)    Start     Dose/Rate Route Frequency Ordered Stop   08/14/21 2200  vancomycin (VANCOREADY) IVPB 1250 mg/250 mL        1,250 mg 166.7 mL/hr over 90 Minutes Intravenous Every 24 hours 08/13/21 2204     08/13/21 2200  clindamycin (CLEOCIN) IVPB 600 mg  Status:  Discontinued        600 mg 100 mL/hr over 30 Minutes Intravenous Every 8 hours 08/13/21 2024 08/13/21 2031   08/13/21 2030  vancomycin (VANCOREADY) IVPB 2000 mg/400 mL        2,000 mg 200 mL/hr over 120 Minutes Intravenous  Once 08/13/21 2024 08/14/21 0230              Family Communication/Anticipated D/C date and plan/Code Status  DVT prophylaxis:      Code Status: Full Code  Family Communication: Mother at the bedside Disposition Plan:    Status is: Inpatient  Remains inpatient appropriate because:IV treatments appropriate due to intensity of illness or inability to take PO and Inpatient level of care appropriate due to severity of illness  Dispo: The patient is from: Home              Anticipated d/c is to: Home              Patient currently is not medically stable to d/c.   Difficult to place patient No           Subjective:   Interval events noted.  She complains of pain and swelling in the left lower extremity.  Her mother was at the bedside.  Objective:    Vitals:   08/14/21 2102 08/15/21 0007 08/15/21 0357  08/15/21 0757  BP: (!) 120/56  118/65   Pulse: 86  75 75  Resp: 17  20 15   Temp: 97.6 F (36.4 C)  97.6 F (36.4 C)   TempSrc: Oral  Oral   SpO2: 97% 100% 94% 97%   No data found.   Intake/Output Summary (Last 24 hours) at 08/15/2021 1140 Last data filed at 08/15/2021 1001 Gross per 24 hour  Intake 250 ml  Output --  Net 250 ml   There were no vitals filed for this visit.  Exam:  GEN: NAD SKIN: Warm and dry EYES: EOMI ENT: MMM CV: RRR PULM: CTA B ABD: soft, obese, NT, +BS CNS: AAO x 3, non focal EXT: Significant swelling of the left upper extremity from the thigh to the foot.  Mild tenderness of the left lower extremity.  Mild erythema of the left leg (swelling, tenderness and erythema have improved according to the patient and her mother)        Data Reviewed:   I have personally reviewed following labs and imaging studies:  Labs: Labs show the following:   Basic Metabolic Panel: Recent Labs  Lab 08/13/21 1726 08/14/21 0529 08/14/21 2227  NA 142 140  --   K 4.0 3.8  --   CL 110 111  --   CO2 22 22  --   GLUCOSE 90 84  --   BUN 13 13  --   CREATININE 0.62 0.64 0.90  CALCIUM 8.1* 8.2*  --    GFR Estimated Creatinine Clearance: 77.9 mL/min (by C-G formula based on SCr of 0.9 mg/dL). Liver Function Tests: Recent Labs  Lab 08/14/21 0529  AST 16  ALT 17  ALKPHOS 68  BILITOT 0.4  PROT 7.1  ALBUMIN 2.7*   No results for input(s): LIPASE, AMYLASE in the last 168 hours. No results for input(s): AMMONIA in the last 168 hours. Coagulation profile No results for input(s): INR, PROTIME in the last 168 hours.  CBC: Recent Labs  Lab 08/13/21 1726 08/14/21 0529 08/15/21 0630  WBC 5.9 4.3 4.6  NEUTROABS 3.4  --   --   HGB 8.5* 7.5* 7.6*  HCT 29.3* 25.8* 25.5*  MCV 92.7 92.5 91.1  PLT 350 328 352   Cardiac Enzymes: No results for input(s): CKTOTAL, CKMB, CKMBINDEX, TROPONINI in the last 168 hours. BNP (last 3 results) No results for input(s):  PROBNP in the last 8760 hours. CBG: No results for input(s): GLUCAP in the last 168 hours. D-Dimer: No results for input(s): DDIMER in the last 72 hours. Hgb A1c: No results for input(s):  HGBA1C in the last 72 hours. Lipid Profile: No results for input(s): CHOL, HDL, LDLCALC, TRIG, CHOLHDL, LDLDIRECT in the last 72 hours. Thyroid function studies: No results for input(s): TSH, T4TOTAL, T3FREE, THYROIDAB in the last 72 hours.  Invalid input(s): FREET3 Anemia work up: No results for input(s): VITAMINB12, FOLATE, FERRITIN, TIBC, IRON, RETICCTPCT in the last 72 hours. Sepsis Labs: Recent Labs  Lab 08/13/21 1726 08/14/21 0529 08/15/21 0630  WBC 5.9 4.3 4.6    Microbiology Recent Results (from the past 240 hour(s))  Resp Panel by RT-PCR (Flu A&B, Covid) Nasopharyngeal Swab     Status: None   Collection Time: 08/13/21 10:24 PM   Specimen: Nasopharyngeal Swab; Nasopharyngeal(NP) swabs in vial transport medium  Result Value Ref Range Status   SARS Coronavirus 2 by RT PCR NEGATIVE NEGATIVE Final    Comment: (NOTE) SARS-CoV-2 target nucleic acids are NOT DETECTED.  The SARS-CoV-2 RNA is generally detectable in upper respiratory specimens during the acute phase of infection. The lowest concentration of SARS-CoV-2 viral copies this assay can detect is 138 copies/mL. A negative result does not preclude SARS-Cov-2 infection and should not be used as the sole basis for treatment or other patient management decisions. A negative result may occur with  improper specimen collection/handling, submission of specimen other than nasopharyngeal swab, presence of viral mutation(s) within the areas targeted by this assay, and inadequate number of viral copies(<138 copies/mL). A negative result must be combined with clinical observations, patient history, and epidemiological information. The expected result is Negative.  Fact Sheet for Patients:  BloggerCourse.com  Fact  Sheet for Healthcare Providers:  SeriousBroker.it  This test is no t yet approved or cleared by the [place] FDA and  has been authorized for detection and/or diagnosis of SARS-CoV-2 by FDA under an Emergency Use Authorization (EUA). This EUA will remain  in effect (meaning this test can be used) for the duration of the COVID-19 declaration under Section 564(b)(1) of the Act, 21 U.S.C.section 360bbb-3(b)(1), unless the authorization is terminated  or revoked sooner.       Influenza A by PCR NEGATIVE NEGATIVE Final   Influenza B by PCR NEGATIVE NEGATIVE Final    Comment: (NOTE) The Xpert Xpress SARS-CoV-2/FLU/RSV plus assay is intended as an aid in the diagnosis of influenza from Nasopharyngeal swab specimens and should not be used as a sole basis for treatment. Nasal washings and aspirates are unacceptable for Xpert Xpress SARS-CoV-2/FLU/RSV testing.  Fact Sheet for Patients: BloggerCourse.com  Fact Sheet for Healthcare Providers: SeriousBroker.it  This test is not yet approved or cleared by the [place] FDA and has been authorized for detection and/or diagnosis of SARS-CoV-2 by FDA under an Emergency Use Authorization (EUA). This EUA will remain in effect (meaning this test can be used) for the duration of the COVID-19 declaration under Section 564(b)(1) of the Act, 21 U.S.C. section 360bbb-3(b)(1), unless the authorization is terminated or revoked.  Performed at [facility], [address] [place], [place] [address]   MRSA Next Gen by PCR, Nasal     Status: None   Collection Time: 08/14/21 11:45 PM   Specimen: Nasal Mucosa; Nasal Swab  Result Value Ref Range Status   MRSA by PCR Next Gen NOT DETECTED NOT DETECTED Final    Comment: (NOTE) The GeneXpert MRSA Assay (FDA approved for NASAL specimens only), is one component of a comprehensive MRSA colonization  surveillance program. It is not intended to diagnose MRSA infection nor to guide or monitor treatment for  MRSA infections. Test performance is not FDA approved in patients less than 41 years old. Performed at [facility], [address], [place], [place] [address]     Procedures and diagnostic studies:  VAS [place] LOWER EXTREMITY VENOUS (DVT) (ONLY [facility] & [facility])  Result Date: 08/13/2021  Lower Venous DVT Study Patient Name:  TAIWAN MILLON  Date of Exam:   08/13/2021 Medical Rec #: [id number]        Accession #:    [id number] Date of Birth: 13-Dec-1981         Patient Gender: F Patient Age:   89 years Exam Location:  [facility] Procedure:      VAS [place] LOWER EXTREMITY VENOUS (DVT) Referring Phys: Parke Poisson --------------------------------------------------------------------------------  Indications: Pain, Swelling, and Erythema.  Risk Factors: None identified. Limitations: Body habitus, poor ultrasound/tissue interface and patient positioning, patient pain tolerance. Comparison Study: No prior studies. Performing Technologist: Chanda Busing RVT  Examination Guidelines: A complete evaluation includes B-mode imaging, spectral Doppler, color Doppler, and power Doppler as needed of all accessible portions of each vessel. Bilateral testing is considered an integral part of a complete examination. Limited examinations for reoccurring indications may be performed as noted. The reflux portion of the exam is performed with the patient in reverse Trendelenburg.  +-----+---------------+---------+-----------+----------+--------------+ RIGHTCompressibilityPhasicitySpontaneityPropertiesThrombus Aging +-----+---------------+---------+-----------+----------+--------------+ CFV  Full           Yes      Yes                                 +-----+---------------+---------+-----------+----------+--------------+   +---------+---------------+---------+-----------+----------+-------------------+  LEFT     CompressibilityPhasicitySpontaneityPropertiesThrombus Aging      +---------+---------------+---------+-----------+----------+-------------------+ CFV      None           No       No                   Acute               +---------+---------------+---------+-----------+----------+-------------------+ SFJ      None                                         Acute               +---------+---------------+---------+-----------+----------+-------------------+ FV Prox  None           No       No                   Acute               +---------+---------------+---------+-----------+----------+-------------------+ FV Mid                  No       No                   Acute               +---------+---------------+---------+-----------+----------+-------------------+ FV Distal               No       No                   Acute               +---------+---------------+---------+-----------+----------+-------------------+ PFV  Not well visualized +---------+---------------+---------+-----------+----------+-------------------+ POP      None           No       No                   Acute               +---------+---------------+---------+-----------+----------+-------------------+ PTV      Full                                                             +---------+---------------+---------+-----------+----------+-------------------+ PERO                                                  Not well visualized +---------+---------------+---------+-----------+----------+-------------------+ GSV      None                                         Acute               +---------+---------------+---------+-----------+----------+-------------------+ EIV                     No       Yes                                      +---------+---------------+---------+-----------+----------+-------------------+ CIV                                                    Not visualized      +---------+---------------+---------+-----------+----------+-------------------+     Summary: RIGHT: - No evidence of common femoral vein obstruction.  LEFT: - Findings consistent with acute deep vein thrombosis involving the left common femoral vein, left femoral vein, and left popliteal vein. - Findings consistent with acute superficial vein thrombosis involving the left great saphenous vein. - No cystic structure found in the popliteal fossa.  *See table(s) above for measurements and observations. Electronically signed by Coral Else MD on 08/13/2021 at 10:42:01 PM.    Final                LOS: 2 days   Marissa Gray  [facility]   Pager on [email or web address]. If 7PM-7AM, please contact night-coverage at [email or web address]     08/15/2021, 11:40 AM

## 2021-08-15 NOTE — Progress Notes (Signed)
ANTICOAGULATION CONSULT NOTE    Pharmacy Consult for IV heparin Indication: new LLE DVT  Patient Measurements: Height 4 ft 11 inches Actual Weight 83.7 kg Heparin Dosing Weight: 63 kg (used [place] for initial dosing)  Vital Signs: Temp: 97.6 F (36.4 C) (08/19 0357) Temp Source: Oral (08/19 0357) BP: 118/65 (08/19 0357) Pulse Rate: 75 (08/19 0757)  Labs: Recent Labs    08/13/21 1726 08/14/21 0529 08/14/21 1421 08/14/21 2227 08/15/21 0630 08/15/21 1222  HGB 8.5* 7.5*  --   --  7.6*  --   HCT 29.3* 25.8*  --   --  25.5*  --   PLT 350 328  --   --  352  --   HEPARINUNFRC  --  0.21*   < > 0.72* 0.69 0.34  CREATININE 0.62 0.64  --  0.90  --   --    < > = values in this interval not displayed.    Estimated Creatinine Clearance: 77.9 mL/min (by C-G formula based on SCr of 0.9 mg/dL).  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 Found to have LLE DVT. Pharmacy consulted to dose IV heparin; Vascular Surgery was also consulted for possible intervention. Plan for thrombectomy by VVS next week   08/15/21: the 2nd Heparin level today is 0.34, remains therapeutic on heparin drip 1550 units/hr.  Hgb 7.6 from 7.5 yesterday. PLTC remains within normal.  No bleeding noted.  Goal of Therapy: Heparin level 0.3-0.7 units/ml Monitor platelets by anticoagulation protocol: Yes  Plan: Continue Heparin IV drip at  1550 units/hr Daily heparin level and CBC Monitor for signs and symptoms of bleeding  Noah Delaine, RPh Clinical Pharmacist [phone] Please see amion for complete clinical pharmacist phone list 08/15/2021, 3:54 PM

## 2021-08-16 DIAGNOSIS — I824Y2 Acute embolism and thrombosis of unspecified deep veins of left proximal lower extremity: Secondary | ICD-10-CM | POA: Diagnosis not present

## 2021-08-16 DIAGNOSIS — L03116 Cellulitis of left lower limb: Secondary | ICD-10-CM | POA: Diagnosis not present

## 2021-08-16 LAB — CBC
HCT: 27.5 % — ABNORMAL LOW (ref 36.0–46.0)
Hemoglobin: 8.1 g/dL — ABNORMAL LOW (ref 12.0–15.0)
MCH: 27 pg (ref 26.0–34.0)
MCHC: 29.5 g/dL — ABNORMAL LOW (ref 30.0–36.0)
MCV: 91.7 fL (ref 80.0–100.0)
Platelets: 392 10*3/uL (ref 150–400)
RBC: 3 MIL/uL — ABNORMAL LOW (ref 3.87–5.11)
RDW: 19.7 % — ABNORMAL HIGH (ref 11.5–15.5)
WBC: 4.6 10*3/uL (ref 4.0–10.5)
nRBC: 0.4 % — ABNORMAL HIGH (ref 0.0–0.2)

## 2021-08-16 LAB — HEPARIN LEVEL (UNFRACTIONATED): Heparin Unfractionated: 0.58 IU/mL (ref 0.30–0.70)

## 2021-08-16 LAB — CREATININE, SERUM
Creatinine, Ser: 0.82 mg/dL (ref 0.44–1.00)
GFR, Estimated: >60 mL/min (ref >60)

## 2021-08-16 MED ORDER — SODIUM CHLORIDE 0.9% FLUSH
3.0000 mL | Freq: Two times a day (BID) | INTRAVENOUS | Status: DC
  Administered 2021-08-16 – 2021-08-21 (×12): 3 mL via INTRAVENOUS

## 2021-08-16 MED ORDER — SODIUM CHLORIDE 0.9% FLUSH
3.0000 mL | INTRAVENOUS | Status: DC | PRN
  Administered 2021-08-16: 3 mL via INTRAVENOUS

## 2021-08-16 NOTE — Progress Notes (Signed)
Pharmacy Antibiotic Note  Marissa Gray is a 40 y.o. female admitted on 08/13/2021 with LLE DVT. Seen by Vascular Surgery, who would also like cellulitis coverage. Pharmacy has been consulted for vancomycin dosing.  Plan: Continue vancomycin 1250 mg IV q24 hr (est AUC 518 based on SCr adj to 0.8; Vd 0.5) Measure vancomycin AUC at steady state as indicated SCr q48 while on vanc    Temp (24hrs), Avg:97.6 F (36.4 C), Min:97.6 F (36.4 C), Max:97.6 F (36.4 C)  Recent Labs  Lab 08/13/21 1726 08/14/21 0529 08/14/21 2227 08/15/21 0630 08/16/21 0433  WBC 5.9 4.3  --  4.6 4.6  CREATININE 0.62 0.64 0.90  --  0.82     Estimated Creatinine Clearance: 85.5 mL/min (by C-G formula based on SCr of 0.82 mg/dL).    Allergies  Allergen Reactions   Cefaclor Hives and Rash    Other reaction(s): Unknown   Penicillins Hives     Has patient had a PCN reaction causing immediate rash, facial/tongue/throat swelling, SOB or lightheadedness with hypotension: Unknown Has patient had a PCN reaction causing severe rash involving mucus membranes or skin necrosis: Unknown Has patient had a PCN reaction that required hospitalization: Unknown Has patient had a PCN reaction occurring within the last 10 years: Unknown If all of the above answers are "NO", then may proceed with Cephalosporin use. Other reaction(s): rash, Unknown   Sulfa Antibiotics Hives and Other (See Comments)    FEVER Other reaction(s): Unknown   Vancomycin 8/18 >>  MRSA not detected 8/19  No Bcx or Ucx   Thank you for allowing pharmacy to be a part of this patient's care.  March Rummage, PharmD PGY1 Pharmacy Resident  Please check AMION for all [facility] pharmacy phone numbers After 10:00 PM call main pharmacy [phone]

## 2021-08-16 NOTE — Progress Notes (Signed)
ANTICOAGULATION CONSULT NOTE    Pharmacy Consult for IV heparin Indication: new LLE DVT  Patient Measurements: Height 4 ft 11 inches Actual Weight 83.7 kg Heparin Dosing Weight: 63 kg (used [place] for initial dosing)  Vital Signs: Temp: 97.6 F (36.4 C) (08/20 0641) Temp Source: Oral (08/20 0641) Pulse Rate: 78 (08/20 0641)  Labs: Recent Labs    08/14/21 0529 08/14/21 1421 08/14/21 2227 08/15/21 0630 08/15/21 1222 08/16/21 0433  HGB 7.5*  --   --  7.6*  --  8.1*  HCT 25.8*  --   --  25.5*  --  27.5*  PLT 328  --   --  352  --  392  HEPARINUNFRC 0.21*   < > 0.72* 0.69 0.34 0.58  CREATININE 0.64  --  0.90  --   --  0.82   < > = values in this interval not displayed.   Estimated Creatinine Clearance: 85.5 mL/min (by C-G formula based on SCr of 0.82 mg/dL).  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 Found to have LLE DVT. Pharmacy consulted to dose IV heparin; Vascular Surgery was also consulted for possible intervention. Plan for thrombectomy by VVS next week   8/20: heparin level is 0.58, remains therapeutic on heparin drip 1550 units/hr.  Hgb 8.1 from 7.6 yesterday. PLTC remains within normal.  No bleeding noted.  Goal of Therapy: Heparin level 0.3-0.7 units/ml Monitor platelets by anticoagulation protocol: Yes  Plan: Continue Heparin IV drip at 1550 units/hr Daily heparin level and  CBC Monitor for signs and symptoms of bleeding  March Rummage, PharmD PGY1 Pharmacy Resident  Please check AMION for all [facility] pharmacy phone numbers After 10:00 PM call main pharmacy [phone]

## 2021-08-16 NOTE — Progress Notes (Signed)
Progress Note    ZAVANNAH KEMMERER  SVX:[id number] DOB: 07-17-1981  DOA: 08/13/2021 PCP: Merri Brunette, MD      Brief Narrative:    Medical records reviewed and are as summarized below:  Marissa Gray is a 40 y.o. 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  She presented to [facility] long hospital with pain, swelling and redness of the left lower extremity.  She was admitted to the hospital for acute left lower extremity DVT and acute left lower extremity cellulitis.  She was treated with IV heparin, IV antibiotics and analgesics.  She was transferred to [facility] for thrombectomy.     Assessment/Plan:   Principal Problem:   DVT (deep venous thrombosis) (HCC) Active Problems:   Cellulitis of left lower extremity   Down's syndrome   Rheumatoid arthritis involving multiple sites (HCC)   Gastroesophageal reflux disease without esophagitis   Hypothyroidism   Acute left lower extremity DVT, acute superficial femoral vein thrombophlebitis: Continue IV heparin drip and monitor heparin level per protocol.  Plan for thrombectomy by vascular surgeon next week.  Acute left lower extremity cellulitis: Continue IV vancomycin and analgesics as needed for pain.    Chronic anemia: H&H is stable.  Low normal vitamin B12 level (238), normal iron and iron saturation level and borderline low normal ferritin level.  No indication for blood transfusion at this time.    Other comorbidities include hypothyroidism, asthma, rheumatoid arthritis, Down's syndrome    Diet Order             Diet regular Room service appropriate? Yes; Fluid consistency: Thin  Diet effective now                      Consultants: Vascular surgeon  Procedures: None    Medications:    vitamin  C  500 mg Oral Daily   budesonide (PULMICORT) nebulizer solution  0.25 mg Nebulization BID   calcium carbonate  1 tablet Oral Q breakfast   cholecalciferol  2,000 Units Oral Daily   docusate sodium  100 mg Oral BID   ferrous sulfate  325 mg Oral Q breakfast   folic acid  3 mg Oral q AM   ketotifen  1 drop Both Eyes BID   levothyroxine  75 mcg Oral QAC breakfast   miconazole nitrate   Topical BID   montelukast  10 mg Oral QHS   pantoprazole  40 mg Oral Daily   sodium chloride flush  3 mL Intravenous Q12H   Continuous Infusions:  heparin 1,550 Units/hr (08/16/21 0255)   vancomycin 1,250 mg (08/15/21 2131)     Anti-infectives (From admission, onward)    Start     Dose/Rate Route Frequency Ordered Stop   08/14/21 2200  vancomycin (VANCOREADY) IVPB 1250 mg/250 mL        1,250 mg 166.7 mL/hr over 90 Minutes Intravenous Every 24 hours 08/13/21 2204     08/13/21 2200  clindamycin (CLEOCIN) IVPB 600 mg  Status:  Discontinued        600 mg 100 mL/hr over 30 Minutes Intravenous Every 8 hours 08/13/21 2024 08/13/21 2031   08/13/21 2030  vancomycin (VANCOREADY) IVPB 2000 mg/400 mL        2,000 mg 200 mL/hr over 120 Minutes Intravenous  Once 08/13/21 2024 08/14/21  0230              Family Communication/Anticipated D/C date and plan/Code Status   DVT prophylaxis:      Code Status: Full Code  Family Communication: Mother at the bedside Disposition Plan:    Status is: Inpatient  Remains inpatient appropriate because:IV treatments appropriate due to intensity of illness or inability to take PO and Inpatient level of care appropriate due to severity of illness  Dispo: The patient is from: Home              Anticipated d/c is to: Home              Patient currently is not medically stable to d/c.   Difficult to place patient No           Subjective:   C/o swelling in the left leg.  She also complains of pain in the left knee from her arthritis.  Pain in the left  leg is better.  Her mother was at the bedside.  Objective:    Vitals:   08/15/21 2108 08/15/21 2134 08/16/21 0641 08/16/21 0809  BP:  126/77    Pulse:  81 78   Resp:  15 15   Temp:  97.6 F (36.4 C) 97.6 F (36.4 C)   TempSrc:  Oral Oral   SpO2: 98% 96% 98% 100%   No data found.   Intake/Output Summary (Last 24 hours) at 08/16/2021 1112 Last data filed at 08/16/2021 0600 Gross per 24 hour  Intake 828.16 ml  Output --  Net 828.16 ml   There were no vitals filed for this visit.  Exam:  GEN: NAD SKIN: No rash EYES: EOMI ENT: MMM CV: RRR PULM: CTA B ABD: soft, obese, NT, +BS CNS: AAO x 3, non focal EXT: Swelling and tenderness of the left lower extremity from the thigh to the foot.  Erythema of the left leg is improving.        Data Reviewed:   I have personally reviewed following labs and imaging studies:  Labs: Labs show the following:   Basic Metabolic Panel: Recent Labs  Lab 08/13/21 1726 08/14/21 0529 08/14/21 2227 08/16/21 0433  NA 142 140  --   --   K 4.0 3.8  --   --   CL 110 111  --   --   CO2 22 22  --   --   GLUCOSE 90 84  --   --   BUN 13 13  --   --   CREATININE 0.62 0.64 0.90 0.82  CALCIUM 8.1* 8.2*  --   --    GFR Estimated Creatinine Clearance: 85.5 mL/min (by C-G formula based on SCr of 0.82 mg/dL). Liver Function Tests: Recent Labs  Lab 08/14/21 0529  AST 16  ALT 17  ALKPHOS 68  BILITOT 0.4  PROT 7.1  ALBUMIN 2.7*   No results for input(s): LIPASE, AMYLASE in the last 168 hours. No results for input(s): AMMONIA in the last 168 hours. Coagulation profile No results for input(s): INR, PROTIME in the last 168 hours.  CBC: Recent Labs  Lab 08/13/21 1726 08/14/21 0529 08/15/21 0630 08/16/21 0433  WBC 5.9 4.3 4.6 4.6  NEUTROABS 3.4  --   --   --   HGB 8.5* 7.5* 7.6* 8.1*  HCT 29.3* 25.8* 25.5* 27.5*  MCV 92.7 92.5 91.1 91.7  PLT 350 328 352 392   Cardiac Enzymes: No results for input(s): CKTOTAL, CKMB,  CKMBINDEX,  TROPONINI in the last 168 hours. BNP (last 3 results) No results for input(s): PROBNP in the last 8760 hours. CBG: No results for input(s): GLUCAP in the last 168 hours. D-Dimer: No results for input(s): DDIMER in the last 72 hours. Hgb A1c: No results for input(s): HGBA1C in the last 72 hours. Lipid Profile: No results for input(s): CHOL, HDL, LDLCALC, TRIG, CHOLHDL, LDLDIRECT in the last 72 hours. Thyroid function studies: No results for input(s): TSH, T4TOTAL, T3FREE, THYROIDAB in the last 72 hours.  Invalid input(s): FREET3 Anemia work up: Recent Labs    08/15/21 1221  VITAMINB12 238  FERRITIN 11  TIBC 358  IRON 119   Sepsis Labs: Recent Labs  Lab 08/13/21 1726 08/14/21 0529 08/15/21 0630 08/16/21 0433  WBC 5.9 4.3 4.6 4.6    Microbiology Recent Results (from the past 240 hour(s))  Resp Panel by RT-PCR (Flu A&B, Covid) Nasopharyngeal Swab     Status: None   Collection Time: 08/13/21 10:24 PM   Specimen: Nasopharyngeal Swab; Nasopharyngeal(NP) swabs in vial transport medium  Result Value Ref Range Status   SARS Coronavirus 2 by RT PCR NEGATIVE NEGATIVE Final    Comment: (NOTE) SARS-CoV-2 target nucleic acids are NOT DETECTED.  The SARS-CoV-2 RNA is generally detectable in upper respiratory specimens during the acute phase of infection. The lowest concentration of SARS-CoV-2 viral copies this assay can detect is 138 copies/mL. A negative result does not preclude SARS-Cov-2 infection and should not be used as the sole basis for treatment or other patient management decisions. A negative result may occur with  improper specimen collection/handling, submission of specimen other than nasopharyngeal swab, presence of viral mutation(s) within the areas targeted by this assay, and inadequate number of viral copies(<138 copies/mL). A negative result must be combined with clinical observations, patient history, and epidemiological information. The expected  result is Negative.  Fact Sheet for Patients:  BloggerCourse.com  Fact Sheet for Healthcare Providers:  SeriousBroker.it  This test is no t yet approved or cleared by the [place] FDA and  has been authorized for detection and/or diagnosis of SARS-CoV-2 by FDA under an Emergency Use Authorization (EUA). This EUA will remain  in effect (meaning this test can be used) for the duration of the COVID-19 declaration under Section 564(b)(1) of the Act, 21 U.S.C.section 360bbb-3(b)(1), unless the authorization is terminated  or revoked sooner.       Influenza A by PCR NEGATIVE NEGATIVE Final   Influenza B by PCR NEGATIVE NEGATIVE Final    Comment: (NOTE) The Xpert Xpress SARS-CoV-2/FLU/RSV plus assay is intended as an aid in the diagnosis of influenza from Nasopharyngeal swab specimens and should not be used as a sole basis for treatment. Nasal washings and aspirates are unacceptable for Xpert Xpress SARS-CoV-2/FLU/RSV testing.  Fact Sheet for Patients: BloggerCourse.com  Fact Sheet for Healthcare Providers: SeriousBroker.it  This test is not yet approved or cleared by the [place] FDA and has been authorized for detection and/or diagnosis of SARS-CoV-2 by FDA under an Emergency Use Authorization (EUA). This EUA will remain in effect (meaning this test can be used) for the duration of the COVID-19 declaration under Section 564(b)(1) of the Act, 21 U.S.C. section 360bbb-3(b)(1), unless the authorization is terminated or revoked.  Performed at [facility], [address], [place], [place] [address]   MRSA Next Gen by PCR, Nasal     Status: None   Collection Time: 08/14/21 11:45 PM   Specimen: Nasal Mucosa; Nasal Swab  Result  Value Ref Range Status   MRSA by PCR Next Gen NOT DETECTED NOT DETECTED Final    Comment: (NOTE) The GeneXpert MRSA Assay (FDA  approved for NASAL specimens only), is one component of a comprehensive MRSA colonization surveillance program. It is not intended to diagnose MRSA infection nor to guide or monitor treatment for MRSA infections. Test performance is not FDA approved in patients less than 94 years old. Performed at [facility], [address], [place], [place] [address]     Procedures and diagnostic studies:  No results found.             LOS: 3 days   Thurman Sarver  [facility]   Pager on [email or web address]. If 7PM-7AM, please contact night-coverage at [email or web address]     08/16/2021, 11:12 AM

## 2021-08-16 NOTE — Plan of Care (Signed)
  Problem: Activity: Goal: Risk for activity intolerance will decrease Outcome: Progressing  Pt will  continue to ambulate with assistance and take pain medication as needed.

## 2021-08-17 DIAGNOSIS — I824Y2 Acute embolism and thrombosis of unspecified deep veins of left proximal lower extremity: Secondary | ICD-10-CM | POA: Diagnosis not present

## 2021-08-17 DIAGNOSIS — L03116 Cellulitis of left lower limb: Secondary | ICD-10-CM | POA: Diagnosis not present

## 2021-08-17 LAB — HEPARIN LEVEL (UNFRACTIONATED): Heparin Unfractionated: 0.61 IU/mL (ref 0.30–0.70)

## 2021-08-17 LAB — CBC
HCT: 26.9 % — ABNORMAL LOW (ref 36.0–46.0)
Hemoglobin: 7.7 g/dL — ABNORMAL LOW (ref 12.0–15.0)
MCH: 26.8 pg (ref 26.0–34.0)
MCHC: 28.6 g/dL — ABNORMAL LOW (ref 30.0–36.0)
MCV: 93.7 fL (ref 80.0–100.0)
Platelets: 360 10*3/uL (ref 150–400)
RBC: 2.87 MIL/uL — ABNORMAL LOW (ref 3.87–5.11)
RDW: 20.3 % — ABNORMAL HIGH (ref 11.5–15.5)
WBC: 4.5 10*3/uL (ref 4.0–10.5)
nRBC: 0 % (ref 0.0–0.2)

## 2021-08-17 MED ORDER — SODIUM CHLORIDE 0.9 % IV SOLN
200.0000 mg | Freq: Once | INTRAVENOUS | Status: AC
  Administered 2021-08-17: 200 mg via INTRAVENOUS
  Filled 2021-08-17: qty 10

## 2021-08-17 NOTE — Progress Notes (Addendum)
Progress Note    Marissa Gray  ZOX:[id number] DOB: [birth year]  DOA: 08/13/2021 PCP: Merri Brunette, MD      Brief Narrative:    Medical records reviewed and are as summarized below:  Marissa Gray is a 40 y.o. 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  She presented to [facility] long hospital with pain, swelling and redness of the left lower extremity.  She was admitted to the hospital for acute left lower extremity DVT and acute left lower extremity cellulitis.  She was treated with IV heparin, IV antibiotics and analgesics.  She was transferred to [facility] for thrombectomy.     Assessment/Plan:   Principal Problem:   DVT (deep venous thrombosis) (HCC) Active Problems:   Cellulitis of left lower extremity   Down's syndrome   Rheumatoid arthritis involving multiple sites (HCC)   Gastroesophageal reflux disease without esophagitis   Hypothyroidism   Acute left lower extremity DVT, acute superficial femoral vein thrombophlebitis: Continue IV heparin drip and monitor level per protocol.  Plan for thrombectomy early this week.  Follow-up with vascular surgeon.    Acute left lower extremity cellulitis: Continue IV vancomycin.  Analgesics as needed for pain.  Iron deficiency anemia, anemia of chronic disease: H&H is stable.  Low normal vitamin B12 level (238), normal iron and transferrin saturation level, and low ferritin level.  Give 1 dose of IV iron sucrose.    Other comorbidities include hypothyroidism, asthma, rheumatoid arthritis, Down's syndrome  Plan of care was discussed with the patient and her mother at the bedside.  Diet Order             Diet regular Room service appropriate? Yes; Fluid consistency: Thin  Diet effective now                       Consultants: Vascular surgeon  Procedures: None    Medications:    vitamin C  500 mg Oral Daily   budesonide (PULMICORT) nebulizer solution  0.25 mg Nebulization BID   calcium carbonate  1 tablet Oral Q breakfast   cholecalciferol  2,000 Units Oral Daily   docusate sodium  100 mg Oral BID   ferrous sulfate  325 mg Oral Q breakfast   folic acid  3 mg Oral q AM   ketotifen  1 drop Both Eyes BID   levothyroxine  75 mcg Oral QAC breakfast   miconazole nitrate   Topical BID   montelukast  10 mg Oral QHS   pantoprazole  40 mg Oral Daily   sodium chloride flush  3 mL Intravenous Q12H   Continuous Infusions:  heparin 1,550 Units/hr (08/16/21 2032)   iron sucrose     vancomycin 1,250 mg (08/16/21 2158)     Anti-infectives (From admission, onward)    Start     Dose/Rate Route Frequency Ordered Stop   08/14/21 2200  vancomycin (VANCOREADY) IVPB 1250 mg/250 mL        1,250 mg 166.7 mL/hr over 90 Minutes Intravenous Every 24 hours 08/13/21 2204     08/13/21 2200  clindamycin (CLEOCIN) IVPB 600 mg  Status:  Discontinued        600 mg 100 mL/hr over 30 Minutes Intravenous Every 8 hours 08/13/21 2024 08/13/21 2031   08/13/21 2030  vancomycin (VANCOREADY) IVPB  2000 mg/400 mL        2,000 mg 200 mL/hr over 120 Minutes Intravenous  Once 08/13/21 2024 08/14/21 0230              Family Communication/Anticipated D/C date and plan/Code Status   DVT prophylaxis:      Code Status: Full Code  Family Communication: Mother at the bedside Disposition Plan:    Status is: Inpatient  Remains inpatient appropriate because:IV treatments appropriate due to intensity of illness or inability to take PO and Inpatient level of care appropriate due to severity of illness  Dispo: The patient is from: Home              Anticipated d/c is to: Home              Patient currently is not medically stable to d/c.   Difficult to place patient No           Subjective:    Interval events noted.  She complains of fatigue and swelling in the left leg.  Her mother was at the bedside  Objective:    Vitals:   08/16/21 1943 08/17/21 0541 08/17/21 0751 08/17/21 0853  BP:  114/60  (!) 106/56  Pulse:  70  67  Resp:  16  14  Temp:  97.8 F (36.6 C)  98.2 F (36.8 C)  TempSrc:  Oral  Oral  SpO2: 95% 96% 100% 97%   No data found.   Intake/Output Summary (Last 24 hours) at 08/17/2021 1112 Last data filed at 08/16/2021 1600 Gross per 24 hour  Intake 616.51 ml  Output --  Net 616.51 ml   There were no vitals filed for this visit.  Exam:  GEN: NAD SKIN: Warm and dry EYES: No pallor or icterus ENT: MMM CV: RRR PULM: CTA B ABD: soft, ND, NT, +BS CNS: AAO x 3, non focal EXT: Significant swelling and mild tenderness of the left lower extremity from the thigh to the foot.  Left leg erythema has improved          Data Reviewed:   I have personally reviewed following labs and imaging studies:  Labs: Labs show the following:   Basic Metabolic Panel: Recent Labs  Lab 08/13/21 1726 08/14/21 0529 08/14/21 2227 08/16/21 0433  NA 142 140  --   --   K 4.0 3.8  --   --   CL 110 111  --   --   CO2 22 22  --   --   GLUCOSE 90 84  --   --   BUN 13 13  --   --   CREATININE 0.62 0.64 0.90 0.82  CALCIUM 8.1* 8.2*  --   --    GFR Estimated Creatinine Clearance: 85.5 mL/min (by C-G formula based on SCr of 0.82 mg/dL). Liver Function Tests: Recent Labs  Lab 08/14/21 0529  AST 16  ALT 17  ALKPHOS 68  BILITOT 0.4  PROT 7.1  ALBUMIN 2.7*   No results for input(s): LIPASE, AMYLASE in the last 168 hours. No results for input(s): AMMONIA in the last 168 hours. Coagulation profile No results for input(s): INR, PROTIME in the last 168 hours.  CBC: Recent Labs  Lab 08/13/21 1726 08/14/21 0529 08/15/21 0630 08/16/21 0433 08/17/21 0357  WBC 5.9 4.3 4.6 4.6 4.5  NEUTROABS 3.4  --   --   --   --   HGB 8.5* 7.5* 7.6* 8.1* 7.7*  HCT 29.3*  25.8* 25.5*  27.5* 26.9*  MCV 92.7 92.5 91.1 91.7 93.7  PLT 350 328 352 392 360   Cardiac Enzymes: No results for input(s): CKTOTAL, CKMB, CKMBINDEX, TROPONINI in the last 168 hours. BNP (last 3 results) No results for input(s): PROBNP in the last 8760 hours. CBG: No results for input(s): GLUCAP in the last 168 hours. D-Dimer: No results for input(s): DDIMER in the last 72 hours. Hgb A1c: No results for input(s): HGBA1C in the last 72 hours. Lipid Profile: No results for input(s): CHOL, HDL, LDLCALC, TRIG, CHOLHDL, LDLDIRECT in the last 72 hours. Thyroid function studies: No results for input(s): TSH, T4TOTAL, T3FREE, THYROIDAB in the last 72 hours.  Invalid input(s): FREET3 Anemia work up: Recent Labs    08/15/21 1221  VITAMINB12 238  FERRITIN 11  TIBC 358  IRON 119   Sepsis Labs: Recent Labs  Lab 08/14/21 0529 08/15/21 0630 08/16/21 0433 08/17/21 0357  WBC 4.3 4.6 4.6 4.5    Microbiology Recent Results (from the past 240 hour(s))  Resp Panel by RT-PCR (Flu A&B, Covid) Nasopharyngeal Swab     Status: None   Collection Time: 08/13/21 10:24 PM   Specimen: Nasopharyngeal Swab; Nasopharyngeal(NP) swabs in vial transport medium  Result Value Ref Range Status   SARS Coronavirus 2 by RT PCR NEGATIVE NEGATIVE Final    Comment: (NOTE) SARS-CoV-2 target nucleic acids are NOT DETECTED.  The SARS-CoV-2 RNA is generally detectable in upper respiratory specimens during the acute phase of infection. The lowest concentration of SARS-CoV-2 viral copies this assay can detect is 138 copies/mL. A negative result does not preclude SARS-Cov-2 infection and should not be used as the sole basis for treatment or other patient management decisions. A negative result may occur with  improper specimen collection/handling, submission of specimen other than nasopharyngeal swab, presence of viral mutation(s) within the areas targeted by this assay, and inadequate number of  viral copies(<138 copies/mL). A negative result must be combined with clinical observations, patient history, and epidemiological information. The expected result is Negative.  Fact Sheet for Patients:  BloggerCourse.com  Fact Sheet for Healthcare Providers:  SeriousBroker.it  This test is no t yet approved or cleared by the [place] FDA and  has been authorized for detection and/or diagnosis of SARS-CoV-2 by FDA under an Emergency Use Authorization (EUA). This EUA will remain  in effect (meaning this test can be used) for the duration of the COVID-19 declaration under Section 564(b)(1) of the Act, 21 U.S.C.section 360bbb-3(b)(1), unless the authorization is terminated  or revoked sooner.       Influenza A by PCR NEGATIVE NEGATIVE Final   Influenza B by PCR NEGATIVE NEGATIVE Final    Comment: (NOTE) The Xpert Xpress SARS-CoV-2/FLU/RSV plus assay is intended as an aid in the diagnosis of influenza from Nasopharyngeal swab specimens and should not be used as a sole basis for treatment. Nasal washings and aspirates are unacceptable for Xpert Xpress SARS-CoV-2/FLU/RSV testing.  Fact Sheet for Patients: BloggerCourse.com  Fact Sheet for Healthcare Providers: SeriousBroker.it  This test is not yet approved or cleared by the [place] FDA and has been authorized for detection and/or diagnosis of SARS-CoV-2 by FDA under an Emergency Use Authorization (EUA). This EUA will remain in effect (meaning this test can be used) for the duration of the COVID-19 declaration under Section 564(b)(1) of the Act, 21 U.S.C. section 360bbb-3(b)(1), unless the authorization is terminated or revoked.  Performed at [facility], [address] [place], [place] [address]   MRSA  Next Gen by PCR, Nasal     Status: None   Collection Time: 08/14/21 11:45 PM   Specimen: Nasal  Mucosa; Nasal Swab  Result Value Ref Range Status   MRSA by PCR Next Gen NOT DETECTED NOT DETECTED Final    Comment: (NOTE) The GeneXpert MRSA Assay (FDA approved for NASAL specimens only), is one component of a comprehensive MRSA colonization surveillance program. It is not intended to diagnose MRSA infection nor to guide or monitor treatment for MRSA infections. Test performance is not FDA approved in patients less than 58 years old. Performed at [facility], [address], [place], [place] [address]     Procedures and diagnostic studies:  No results found.             LOS: 4 days   Nariah Morgano  [facility]   Pager on [email or web address]. If 7PM-7AM, please contact night-coverage at [email or web address]     08/17/2021, 11:12 AM

## 2021-08-17 NOTE — Plan of Care (Signed)

## 2021-08-17 NOTE — Progress Notes (Signed)
ANTICOAGULATION CONSULT NOTE    Pharmacy Consult for IV heparin Indication: new LLE DVT  Patient Measurements: Height 4 ft 11 inches Actual Weight 83.7 kg Heparin Dosing Weight: 63 kg (used [place] for initial dosing)  Vital Signs: Temp: 97.8 F (36.6 C) (08/21 0541) Temp Source: Oral (08/21 0541) BP: 114/60 (08/21 0541) Pulse Rate: 70 (08/21 0541)  Labs: Recent Labs    08/14/21 2227 08/14/21 2227 08/15/21 0630 08/15/21 1222 08/16/21 0433 08/17/21 0357  HGB  --    < > 7.6*  --  8.1* 7.7*  HCT  --   --  25.5*  --  27.5* 26.9*  PLT  --   --  352  --  392 360  HEPARINUNFRC 0.72*  --  0.69 0.34 0.58 0.61  CREATININE 0.90  --   --   --  0.82  --    < > = values in this interval not displayed.    Estimated Creatinine Clearance: 85.5 mL/min (by C-G formula based on SCr of 0.82 mg/dL).  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 Found to have LLE DVT. Pharmacy consulted to dose IV heparin; Vascular Surgery was also consulted for possible intervention. Plan for thrombectomy by VVS next week  8/21: heparin level is 0.61, remains therapeutic on heparin drip 1550 units/hr.  Hgb 7.7 and within her baseline values. PLTC remains within normal.  No bleeding noted.  Goal of Therapy: Heparin level 0.3-0.7 units/ml Monitor platelets by anticoagulation protocol: Yes  Plan: Continue Heparin IV drip at 1550  units/hr Daily heparin level and CBC Monitor for signs and symptoms of bleeding   March Rummage, PharmD PGY1 Pharmacy Resident  Please check AMION for all [facility] pharmacy phone numbers After 10:00 PM call main pharmacy [phone]

## 2021-08-18 DIAGNOSIS — I82412 Acute embolism and thrombosis of left femoral vein: Secondary | ICD-10-CM

## 2021-08-18 DIAGNOSIS — L03116 Cellulitis of left lower limb: Secondary | ICD-10-CM | POA: Diagnosis not present

## 2021-08-18 DIAGNOSIS — I824Y2 Acute embolism and thrombosis of unspecified deep veins of left proximal lower extremity: Secondary | ICD-10-CM | POA: Diagnosis not present

## 2021-08-18 LAB — CBC
HCT: 27.7 % — ABNORMAL LOW (ref 36.0–46.0)
Hemoglobin: 8.2 g/dL — ABNORMAL LOW (ref 12.0–15.0)
MCH: 27.2 pg (ref 26.0–34.0)
MCHC: 29.6 g/dL — ABNORMAL LOW (ref 30.0–36.0)
MCV: 91.7 fL (ref 80.0–100.0)
Platelets: 302 10*3/uL (ref 150–400)
RBC: 3.02 MIL/uL — ABNORMAL LOW (ref 3.87–5.11)
RDW: 20.5 % — ABNORMAL HIGH (ref 11.5–15.5)
WBC: 5.4 10*3/uL (ref 4.0–10.5)
nRBC: 0.6 % — ABNORMAL HIGH (ref 0.0–0.2)

## 2021-08-18 LAB — CREATININE, SERUM
Creatinine, Ser: 0.68 mg/dL (ref 0.44–1.00)
GFR, Estimated: >60 mL/min (ref >60)

## 2021-08-18 LAB — HEPARIN LEVEL (UNFRACTIONATED): Heparin Unfractionated: 0.63 IU/mL (ref 0.30–0.70)

## 2021-08-18 NOTE — Progress Notes (Addendum)
Progress Note    Marissa Gray  CBS:[id number] DOB: May 13, 1981  DOA: 08/13/2021 PCP: Merri Brunette, MD      Brief Narrative:    Medical records reviewed and are as summarized below:  Marissa Gray is a 40 y.o. 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  She presented to [facility] hospital with pain, swelling and redness of the left lower extremity.  She was admitted to the hospital for acute left lower extremity DVT and acute left lower extremity cellulitis.  She was treated with IV heparin, IV antibiotics and analgesics.  She was transferred to [facility] for thrombectomy.     Assessment/Plan:   Principal Problem:   DVT (deep venous thrombosis) (HCC) Active Problems:   Cellulitis of left lower extremity   Down's syndrome   Rheumatoid arthritis involving multiple sites (HCC)   Gastroesophageal reflux disease without esophagitis   Hypothyroidism   Acute left lower extremity DVT, acute superficial femoral vein thrombophlebitis: Continue IV heparin infusion and monitor heparin level per protocol.  Plan for thrombectomy tomorrow.  Follow-up with vascular surgeon.  She  Acute left lower extremity cellulitis: Continue IV vancomycin.  Analgesics as needed for pain.  Iron deficiency anemia, anemia of chronic disease: Hemoglobin is slightly up from 7.7-8.2.  Low normal vitamin B12 level (238), normal iron and transferrin saturation level, and low ferritin level.  S/p IV iron infusion on 08/17/2021.  Other comorbidities include hypothyroidism, asthma, rheumatoid arthritis, Down's syndrome  Plan of care was discussed with the patient and her mother at the bedside.  Diet Order             Diet regular Room service appropriate? Yes; Fluid consistency: Thin  Diet  effective now                      Consultants: Vascular surgeon  Procedures: None    Medications:    vitamin C  500 mg Oral Daily   budesonide (PULMICORT) nebulizer solution  0.25 mg Nebulization BID   calcium carbonate  1 tablet Oral Q breakfast   cholecalciferol  2,000 Units Oral Daily   docusate sodium  100 mg Oral BID   ferrous sulfate  325 mg Oral Q breakfast   folic acid  3 mg Oral q AM   ketotifen  1 drop Both Eyes BID   levothyroxine  75 mcg Oral QAC breakfast   miconazole nitrate   Topical BID   montelukast  10 mg Oral QHS   pantoprazole  40 mg Oral Daily   sodium chloride flush  3 mL Intravenous Q12H   Continuous Infusions:  heparin 1,550 Units/hr (08/18/21 0816)   vancomycin Stopped (08/17/21 2355)     Anti-infectives (From admission, onward)    Start     Dose/Rate Route Frequency Ordered Stop   08/14/21 2200  vancomycin (VANCOREADY) IVPB 1250 mg/250 mL        1,250 mg 166.7 mL/hr over 90 Minutes Intravenous Every 24 hours 08/13/21 2204     08/13/21 2200  clindamycin (CLEOCIN) IVPB 600 mg  Status:  Discontinued        600 mg 100 mL/hr over 30 Minutes Intravenous Every 8 hours 08/13/21 2024 08/13/21 2031   08/13/21 2030  vancomycin (VANCOREADY) IVPB 2000 mg/400 mL  2,000 mg 200 mL/hr over 120 Minutes Intravenous  Once 08/13/21 2024 08/14/21 0230              Family Communication/Anticipated D/C date and plan/Code Status   DVT prophylaxis:      Code Status: Full Code  Family Communication: Mother at the bedside Disposition Plan:    Status is: Inpatient  Remains inpatient appropriate because:IV treatments appropriate due to intensity of illness or inability to take PO and Inpatient level of care appropriate due to severity of illness  Dispo: The patient is from: Home              Anticipated d/c is to: Home              Patient currently is not medically stable to d/c.   Difficult to place patient  No           Subjective:   Interval events noted.  No new complaints.  She still has swelling in the left lower extremity.  Her mother was at the bedside.  Objective:    Vitals:   08/17/21 2100 08/18/21 0406 08/18/21 0746 08/18/21 0817  BP: (!) 114/54 118/60  107/63  Pulse: 82 73 72 76  Resp: Temp: 97.6 F (36.4 C) 97.7 F (36.5 C)  (!) 97.5 F (36.4 C)  TempSrc: Oral Axillary    SpO2: 97% 97% 100% 97%   No data found.   Intake/Output Summary (Last 24 hours) at 08/18/2021 1056 Last data filed at 08/18/2021 0600 Gross per 24 hour  Intake 1793.84 ml  Output --  Net 1793.84 ml   There were no vitals filed for this visit.  Exam:  GEN: NAD SKIN: Warm and dry EYES: No pallor or icterus ENT: MMM CV: RRR PULM: CTA B ABD: soft, ND, NT, +BS CNS: AAO x 3, non focal EXT: Significant swelling of the left lower extremity from the thigh to the foot.  Mild tenderness of the left lower extremity.  No erythema.         Data Reviewed:   I have personally reviewed following labs and imaging studies:  Labs: Labs show the following:   Basic Metabolic Panel: Recent Labs  Lab 08/13/21 1726 08/14/21 0529 08/14/21 2227 08/16/21 0433  NA 142 140  --   --   K 4.0 3.8  --   --   CL 110 111  --   --   CO2 22 22  --   --   GLUCOSE 90 84  --   --   BUN 13 13  --   --   CREATININE 0.62 0.64 0.90 0.82  CALCIUM 8.1* 8.2*  --   --    GFR Estimated Creatinine Clearance: 85.5 mL/min (by C-G formula based on SCr of 0.82 mg/dL). Liver Function Tests: Recent Labs  Lab 08/14/21 0529  AST 16  ALT 17  ALKPHOS 68  BILITOT 0.4  PROT 7.1  ALBUMIN 2.7*   No results for input(s): LIPASE, AMYLASE in the last 168 hours. No results for input(s): AMMONIA in the last 168 hours. Coagulation profile No results for input(s): INR, PROTIME in the last 168 hours.  CBC: Recent Labs  Lab 08/13/21 1726 08/14/21 0529 08/15/21 0630 08/16/21 0433 08/17/21 0357  08/18/21 1004  WBC 5.9 4.3 4.6 4.6 4.5 5.4  NEUTROABS 3.4  --   --   --   --   --   HGB 8.5* 7.5* 7.6* 8.1* 7.7* 8.2*  HCT 29.3* 25.8* 25.5* 27.5* 26.9* 27.7*  MCV 92.7 92.5 91.1 91.7 93.7 91.7  PLT 350 328 352 392 360 302   Cardiac Enzymes: No results for input(s): CKTOTAL, CKMB, CKMBINDEX, TROPONINI in the last 168 hours. BNP (last 3 results) No results for input(s): PROBNP in the last 8760 hours. CBG: No results for input(s): GLUCAP in the last 168 hours. D-Dimer: No results for input(s): DDIMER in the last 72 hours. Hgb A1c: No results for input(s): HGBA1C in the last 72 hours. Lipid Profile: No results for input(s): CHOL, HDL, LDLCALC, TRIG, CHOLHDL, LDLDIRECT in the last 72 hours. Thyroid function studies: No results for input(s): TSH, T4TOTAL, T3FREE, THYROIDAB in the last 72 hours.  Invalid input(s): FREET3 Anemia work up: Recent Labs    08/15/21 1221  VITAMINB12 238  FERRITIN 11  TIBC 358  IRON 119   Sepsis Labs: Recent Labs  Lab 08/15/21 0630 08/16/21 0433 08/17/21 0357 08/18/21 1004  WBC 4.6 4.6 4.5 5.4    Microbiology Recent Results (from the past 240 hour(s))  Resp Panel by RT-PCR (Flu A&B, Covid) Nasopharyngeal Swab     Status: None   Collection Time: 08/13/21 10:24 PM   Specimen: Nasopharyngeal Swab; Nasopharyngeal(NP) swabs in vial transport medium  Result Value Ref Range Status   SARS Coronavirus 2 by RT PCR NEGATIVE NEGATIVE Final    Comment: (NOTE) SARS-CoV-2 target nucleic acids are NOT DETECTED.  The SARS-CoV-2 RNA is generally detectable in upper respiratory specimens during the acute phase of infection. The lowest concentration of SARS-CoV-2 viral copies this assay can detect is 138 copies/mL. A negative result does not preclude SARS-Cov-2 infection and should not be used as the sole basis for treatment or other patient management decisions. A negative result may occur with  improper specimen collection/handling, submission of  specimen other than nasopharyngeal swab, presence of viral mutation(s) within the areas targeted by this assay, and inadequate number of viral copies(<138 copies/mL). A negative result must be combined with clinical observations, patient history, and epidemiological information. The expected result is Negative.  Fact Sheet for Patients:  BloggerCourse.com  Fact Sheet for Healthcare Providers:  SeriousBroker.it  This test is no t yet approved or cleared by the [place] FDA and  has been authorized for detection and/or diagnosis of SARS-CoV-2 by FDA under an Emergency Use Authorization (EUA). This EUA will remain  in effect (meaning this test can be used) for the duration of the COVID-19 declaration under Section 564(b)(1) of the Act, 21 U.S.C.section 360bbb-3(b)(1), unless the authorization is terminated  or revoked sooner.       Influenza A by PCR NEGATIVE NEGATIVE Final   Influenza B by PCR NEGATIVE NEGATIVE Final    Comment: (NOTE) The Xpert Xpress SARS-CoV-2/FLU/RSV plus assay is intended as an aid in the diagnosis of influenza from Nasopharyngeal swab specimens and should not be used as a sole basis for treatment. Nasal washings and aspirates are unacceptable for Xpert Xpress SARS-CoV-2/FLU/RSV testing.  Fact Sheet for Patients: BloggerCourse.com  Fact Sheet for Healthcare Providers: SeriousBroker.it  This test is not yet approved or cleared by the [place] FDA and has been authorized for detection and/or diagnosis of SARS-CoV-2 by FDA under an Emergency Use Authorization (EUA). This EUA will remain in effect (meaning this test can be used) for the duration of the COVID-19 declaration under Section 564(b)(1) of the Act, 21 U.S.C. section 360bbb-3(b)(1), unless the authorization is terminated or revoked.  Performed at [facility], [address] [place], [place] [address]   MRSA Next Gen by PCR, Nasal     Status: None   Collection Time: 08/14/21 11:45 PM   Specimen: Nasal Mucosa; Nasal Swab  Result Value Ref Range Status   MRSA by PCR Next Gen NOT DETECTED NOT DETECTED Final    Comment: (NOTE) The GeneXpert MRSA Assay (FDA approved for NASAL specimens only), is one component of a comprehensive MRSA colonization surveillance program. It is not intended to diagnose MRSA infection nor to guide or monitor treatment for MRSA infections. Test performance is not FDA approved in patients less than 52 years old. Performed at [facility], [address] [place], [place] [address]     Procedures and diagnostic studies:  No results found.             LOS: 5 days   Clary Meeker  [facility]   Pager on [email or web address]. If 7PM-7AM, please contact night-coverage at [email or web address]     08/18/2021, 10:56 AM

## 2021-08-18 NOTE — Progress Notes (Signed)
ANTICOAGULATION CONSULT NOTE    Pharmacy Consult for IV heparin Indication: new LLE DVT  Patient Measurements: Height 4 ft 11 inches Actual Weight 83.7 kg Heparin Dosing Weight: 63 kg (used [place] for initial dosing)  Vital Signs: Temp: 97.5 F (36.4 C) (08/22 0817) Temp Source: Axillary (08/22 0406) BP: 107/63 (08/22 0817) Pulse Rate: 76 (08/22 0817)  Labs: Recent Labs    08/16/21 0433 08/17/21 0357 08/18/21 1004  HGB 8.1* 7.7* 8.2*  HCT 27.5* 26.9* 27.7*  PLT 392 360 302  HEPARINUNFRC 0.58 0.61 0.63  CREATININE 0.82  --   --     Estimated Creatinine Clearance: 85.5 mL/min (by C-G formula based on SCr of 0.82 mg/dL).  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 Found to have LLE DVT. Pharmacy consulted to dose IV heparin; Vascular Surgery was also consulted for possible intervention. Plan for thrombectomy 8/23  Heparin level therapeutic this AM  Goal of Therapy: Heparin level 0.3-0.7 units/ml Monitor platelets by anticoagulation protocol: Yes  Plan: Continue Heparin IV drip at 1550 units/hr Daily heparin level and CBC Monitor for signs and symptoms of bleeding  Thank you Okey Regal, PharmD  Please check AMION for all [facility] pharmacy phone numbers After 10:00 PM call main pharmacy [phone]

## 2021-08-18 NOTE — Plan of Care (Signed)

## 2021-08-18 NOTE — Progress Notes (Signed)
    Subjective  -   No acute events over the weekend   Physical Exam:  Left leg is without cellulitis but remains markedly edematous Respirations are nonlabored    Assessment/Plan:    Left leg DVT: We discussed proceeding with mechanical thrombectomy of her left leg DVT tomorrow in the operating room under general anesthesia.  All of her questions were answered.  Her mom was at the bedside for these discussions.  She will continue on IV heparin.  It should not be stopped on-call to the operating room  Wells Akua Blethen 08/18/2021 5:45 PM --  Vitals:   08/18/21 0817 08/18/21 1607  BP: 107/63 (!) 91/47  Pulse: 76 73  Resp: 19 18  Temp: (!) 97.5 F (36.4 C) 98.5 F (36.9 C)  SpO2: 97% 96%    Intake/Output Summary (Last 24 hours) at 08/18/2021 1745 Last data filed at 08/18/2021 1600 Gross per 24 hour  Intake 1369.56 ml  Output --  Net 1369.56 ml     Laboratory CBC    Component Value Date/Time   WBC 5.4 08/18/2021 1004   HGB 8.2 (L) 08/18/2021 1004   HCT 27.7 (L) 08/18/2021 1004   PLT 302 08/18/2021 1004    BMET    Component Value Date/Time   NA 140 08/14/2021 0529   NA 139 07/30/2017 0000   K 3.8 08/14/2021 0529   CL 111 08/14/2021 0529   CO2 22 08/14/2021 0529   GLUCOSE 84 08/14/2021 0529   BUN 13 08/14/2021 0529   BUN 18 07/30/2017 0000   CREATININE 0.68 08/18/2021 1004   CALCIUM 8.2 (L) 08/14/2021 0529   CALCIUM 7.5 (L) 06/22/2017 0607   GFRNONAA >60 08/18/2021 1004   GFRAA >60 06/24/2017 0623    COAG Lab Results  Component Value Date   INR 1.2 05/18/2021   INR 1.0 04/14/2021   No results found for: PTT  Antibiotics Anti-infectives (From admission, onward)    Start     Dose/Rate Route Frequency Ordered Stop   08/14/21 2200  vancomycin (VANCOREADY) IVPB 1250 mg/250 mL        1,250 mg 166.7 mL/hr over 90 Minutes Intravenous Every 24 hours 08/13/21 2204     08/13/21 2200  clindamycin (CLEOCIN) IVPB 600 mg  Status:  Discontinued        600  mg 100 mL/hr over 30 Minutes Intravenous Every 8 hours 08/13/21 2024 08/13/21 2031   08/13/21 2030  vancomycin (VANCOREADY) IVPB 2000 mg/400 mL        2,000 mg 200 mL/hr over 120 Minutes Intravenous  Once 08/13/21 2024 08/14/21 0230        V. Charlena Cross, M.D., [facility] [place] Office: [phone] Pager:  [phone]

## 2021-08-18 NOTE — H&P (View-Only) (Signed)
    Subjective  -   No acute events over the weekend   Physical Exam:  Left leg is without cellulitis but remains markedly edematous Respirations are nonlabored    Assessment/Plan:    Left leg DVT: We discussed proceeding with mechanical thrombectomy of her left leg DVT tomorrow in the operating room under general anesthesia.  All of her questions were answered.  Her mom was at the bedside for these discussions.  She will continue on IV heparin.  It should not be stopped on-call to the operating room  Marissa Gray 08/18/2021 5:45 PM --  Vitals:   08/18/21 0817 08/18/21 1607  BP: 107/63 (!) 91/47  Pulse: 76 73  Resp: 19 18  Temp: (!) 97.5 F (36.4 C) 98.5 F (36.9 C)  SpO2: 97% 96%    Intake/Output Summary (Last 24 hours) at 08/18/2021 1745 Last data filed at 08/18/2021 1600 Gross per 24 hour  Intake 1369.56 ml  Output --  Net 1369.56 ml     Laboratory CBC    Component Value Date/Time   WBC 5.4 08/18/2021 1004   HGB 8.2 (L) 08/18/2021 1004   HCT 27.7 (L) 08/18/2021 1004   PLT 302 08/18/2021 1004    BMET    Component Value Date/Time   NA 140 08/14/2021 0529   NA 139 07/30/2017 0000   K 3.8 08/14/2021 0529   CL 111 08/14/2021 0529   CO2 22 08/14/2021 0529   GLUCOSE 84 08/14/2021 0529   BUN 13 08/14/2021 0529   BUN 18 07/30/2017 0000   CREATININE 0.68 08/18/2021 1004   CALCIUM 8.2 (L) 08/14/2021 0529   CALCIUM 7.5 (L) 06/22/2017 0607   GFRNONAA >60 08/18/2021 1004   GFRAA >60 06/24/2017 0623    COAG Lab Results  Component Value Date   INR 1.2 05/18/2021   INR 1.0 04/14/2021   No results found for: PTT  Antibiotics Anti-infectives (From admission, onward)    Start     Dose/Rate Route Frequency Ordered Stop   08/14/21 2200  vancomycin (VANCOREADY) IVPB 1250 mg/250 mL        1,250 mg 166.7 mL/hr over 90 Minutes Intravenous Every 24 hours 08/13/21 2204     08/13/21 2200  clindamycin (CLEOCIN) IVPB 600 mg  Status:  Discontinued        600  mg 100 mL/hr over 30 Minutes Intravenous Every 8 hours 08/13/21 2024 08/13/21 2031   08/13/21 2030  vancomycin (VANCOREADY) IVPB 2000 mg/400 mL        2,000 mg 200 mL/hr over 120 Minutes Intravenous  Once 08/13/21 2024 08/14/21 0230        V. Marissa Marissa Gray IV, M.D., FACS [facility] [place] Office: [phone] Pager:  [phone]  

## 2021-08-19 ENCOUNTER — Inpatient Hospital Stay (HOSPITAL_COMMUNITY): Payer: Medicaid Other | Admitting: Anesthesiology

## 2021-08-19 ENCOUNTER — Inpatient Hospital Stay (HOSPITAL_COMMUNITY): Payer: Medicaid Other

## 2021-08-19 ENCOUNTER — Encounter (HOSPITAL_COMMUNITY)
Admission: EM | Disposition: A | Discharge status: Home / Self Care | Payer: Self-pay | Source: Home / Self Care | Attending: Internal Medicine

## 2021-08-19 DIAGNOSIS — I824Y2 Acute embolism and thrombosis of unspecified deep veins of left proximal lower extremity: Secondary | ICD-10-CM | POA: Diagnosis not present

## 2021-08-19 DIAGNOSIS — L03116 Cellulitis of left lower limb: Secondary | ICD-10-CM | POA: Diagnosis not present

## 2021-08-19 HISTORY — PX: ULTRASOUND GUIDANCE FOR VASCULAR ACCESS: SHX6516

## 2021-08-19 HISTORY — PX: MECHANICAL THROMBECTOMY WITH AORTOGRAM AND INTERVENTION: SHX6836

## 2021-08-19 LAB — CBC
HCT: 26.7 % — ABNORMAL LOW (ref 36.0–46.0)
Hemoglobin: 7.9 g/dL — ABNORMAL LOW (ref 12.0–15.0)
MCH: 27.3 pg (ref 26.0–34.0)
MCHC: 29.6 g/dL — ABNORMAL LOW (ref 30.0–36.0)
MCV: 92.4 fL (ref 80.0–100.0)
Platelets: 293 10*3/uL (ref 150–400)
RBC: 2.89 MIL/uL — ABNORMAL LOW (ref 3.87–5.11)
RDW: 21.2 % — ABNORMAL HIGH (ref 11.5–15.5)
WBC: 5 10*3/uL (ref 4.0–10.5)
nRBC: 1.4 % — ABNORMAL HIGH (ref 0.0–0.2)

## 2021-08-19 LAB — POCT ACTIVATED CLOTTING TIME
Activated Clotting Time: 167 seconds
Activated Clotting Time: 213 seconds

## 2021-08-19 LAB — TYPE AND SCREEN
ABO/RH(D): O POS
Antibody Screen: NEGATIVE

## 2021-08-19 LAB — HEPARIN LEVEL (UNFRACTIONATED): Heparin Unfractionated: 0.55 IU/mL (ref 0.30–0.70)

## 2021-08-19 SURGERY — THROMBECTOMY, MECHANICAL
Anesthesia: General | Site: Leg Upper | Laterality: Left

## 2021-08-19 MED ORDER — HEPARIN SODIUM (PORCINE) 1000 UNIT/ML IJ SOLN
INTRAMUSCULAR | Status: AC
  Filled 2021-08-19: qty 1

## 2021-08-19 MED ORDER — PROPOFOL 10 MG/ML IV BOLUS
INTRAVENOUS | Status: DC | PRN
  Administered 2021-08-19: 100 mg via INTRAVENOUS

## 2021-08-19 MED ORDER — MIDAZOLAM HCL 2 MG/2ML IJ SOLN
INTRAMUSCULAR | Status: DC | PRN
  Administered 2021-08-19: 2 mg via INTRAVENOUS

## 2021-08-19 MED ORDER — DEXAMETHASONE SODIUM PHOSPHATE 10 MG/ML IJ SOLN
INTRAMUSCULAR | Status: AC
  Filled 2021-08-19: qty 1

## 2021-08-19 MED ORDER — LIDOCAINE 2% (20 MG/ML) 5 ML SYRINGE
INTRAMUSCULAR | Status: DC | PRN
  Administered 2021-08-19: 20 mg via INTRAVENOUS

## 2021-08-19 MED ORDER — ONDANSETRON HCL 4 MG/2ML IJ SOLN
INTRAMUSCULAR | Status: DC | PRN
  Administered 2021-08-19: 4 mg via INTRAVENOUS

## 2021-08-19 MED ORDER — CHLORHEXIDINE GLUCONATE 0.12 % MT SOLN
OROMUCOSAL | Status: AC
  Administered 2021-08-19: 15 mL via OROMUCOSAL
  Filled 2021-08-19: qty 15

## 2021-08-19 MED ORDER — FENTANYL CITRATE (PF) 100 MCG/2ML IJ SOLN
25.0000 ug | INTRAMUSCULAR | Status: DC | PRN
  Administered 2021-08-19: 50 ug via INTRAVENOUS
  Administered 2021-08-19: 25 ug via INTRAVENOUS

## 2021-08-19 MED ORDER — FENTANYL CITRATE (PF) 100 MCG/2ML IJ SOLN
INTRAMUSCULAR | Status: AC
  Filled 2021-08-19: qty 2

## 2021-08-19 MED ORDER — ORAL CARE MOUTH RINSE: 15.0000 mL | Freq: Once | OROMUCOSAL | Status: AC

## 2021-08-19 MED ORDER — ONDANSETRON HCL 4 MG/2ML IJ SOLN
INTRAMUSCULAR | Status: AC
  Filled 2021-08-19: qty 2

## 2021-08-19 MED ORDER — PHENOL 1.4 % MT LIQD
1.0000 | OROMUCOSAL | Status: DC | PRN
  Administered 2021-08-19 – 2021-08-20 (×2): 1 via OROMUCOSAL
  Filled 2021-08-19 (×3): qty 177

## 2021-08-19 MED ORDER — DEXAMETHASONE SODIUM PHOSPHATE 10 MG/ML IJ SOLN
INTRAMUSCULAR | Status: DC | PRN
  Administered 2021-08-19: 5 mg via INTRAVENOUS

## 2021-08-19 MED ORDER — FENTANYL CITRATE (PF) 250 MCG/5ML IJ SOLN
INTRAMUSCULAR | Status: DC | PRN
  Administered 2021-08-19 (×4): 50 ug via INTRAVENOUS

## 2021-08-19 MED ORDER — CHLORHEXIDINE GLUCONATE 0.12 % MT SOLN: 15.0000 mL | Freq: Once | OROMUCOSAL | Status: AC

## 2021-08-19 MED ORDER — ROCURONIUM BROMIDE 10 MG/ML (PF) SYRINGE
PREFILLED_SYRINGE | INTRAVENOUS | Status: DC | PRN
  Administered 2021-08-19: 10 mg via INTRAVENOUS
  Administered 2021-08-19: 50 mg via INTRAVENOUS
  Administered 2021-08-19 (×2): 30 mg via INTRAVENOUS
  Administered 2021-08-19: 20 mg via INTRAVENOUS
  Administered 2021-08-19: 40 mg via INTRAVENOUS
  Administered 2021-08-19: 20 mg via INTRAVENOUS

## 2021-08-19 MED ORDER — LACTATED RINGERS IV SOLN: INTRAVENOUS | Status: DC

## 2021-08-19 MED ORDER — HEPARIN SODIUM (PORCINE) 1000 UNIT/ML IJ SOLN
INTRAMUSCULAR | Status: DC | PRN
  Administered 2021-08-19: 5000 [IU] via INTRAVENOUS

## 2021-08-19 MED ORDER — ROCURONIUM BROMIDE 10 MG/ML (PF) SYRINGE
PREFILLED_SYRINGE | INTRAVENOUS | Status: AC
  Filled 2021-08-19: qty 20

## 2021-08-19 MED ORDER — MIDAZOLAM HCL 2 MG/2ML IJ SOLN
INTRAMUSCULAR | Status: AC
  Filled 2021-08-19: qty 2

## 2021-08-19 MED ORDER — HEPARIN (PORCINE) 25000 UT/250ML-% IV SOLN
INTRAVENOUS | Status: AC
  Administered 2021-08-20: 1550 [IU]/h via INTRAVENOUS
  Filled 2021-08-19: qty 250

## 2021-08-19 MED ORDER — SUGAMMADEX SODIUM 200 MG/2ML IV SOLN
INTRAVENOUS | Status: DC | PRN
  Administered 2021-08-19: 200 mg via INTRAVENOUS

## 2021-08-19 MED ORDER — PHENYLEPHRINE 40 MCG/ML (10ML) SYRINGE FOR IV PUSH (FOR BLOOD PRESSURE SUPPORT)
PREFILLED_SYRINGE | INTRAVENOUS | Status: DC | PRN
  Administered 2021-08-19: 40 ug via INTRAVENOUS
  Administered 2021-08-19 (×2): 80 ug via INTRAVENOUS

## 2021-08-19 MED ORDER — PHENYLEPHRINE HCL-NACL 20-0.9 MG/250ML-% IV SOLN
INTRAVENOUS | Status: DC | PRN
  Administered 2021-08-19: 25 ug/min via INTRAVENOUS

## 2021-08-19 MED ORDER — LORAZEPAM 2 MG/ML IJ SOLN
1.0000 mg | Freq: Once | INTRAMUSCULAR | Status: AC
  Administered 2021-08-19: 1 mg via INTRAVENOUS
  Filled 2021-08-19: qty 1

## 2021-08-19 MED ORDER — FENTANYL CITRATE (PF) 250 MCG/5ML IJ SOLN
INTRAMUSCULAR | Status: AC
  Filled 2021-08-19: qty 5

## 2021-08-19 MED ORDER — PROPOFOL 10 MG/ML IV BOLUS
INTRAVENOUS | Status: AC
  Filled 2021-08-19: qty 20

## 2021-08-19 MED ORDER — IODIXANOL 320 MG/ML IV SOLN
INTRAVENOUS | Status: DC | PRN
  Administered 2021-08-19: 50 mL via INTRA_ARTERIAL

## 2021-08-19 MED ORDER — 0.9 % SODIUM CHLORIDE (POUR BTL) OPTIME
TOPICAL | Status: DC | PRN
  Administered 2021-08-19: 1000 mL

## 2021-08-19 SURGICAL SUPPLY — 61 items
ADH SKN CLS APL DERMABOND .7 (GAUZE/BANDAGES/DRESSINGS)
APL PRP STRL LF DISP 70% ISPRP (MISCELLANEOUS) ×1
BALLN MUSTANG 9X80X75 (BALLOONS) ×2
BALLOON MUSTANG 9X80X75 (BALLOONS) IMPLANT
BNDG ELASTIC 4X5.8 VLCR STR LF (GAUZE/BANDAGES/DRESSINGS) ×1 IMPLANT
BNDG ELASTIC 6X5.8 VLCR STR LF (GAUZE/BANDAGES/DRESSINGS) ×1 IMPLANT
BNDG GAUZE ELAST 4 BULKY (GAUZE/BANDAGES/DRESSINGS) ×1 IMPLANT
CANISTER SUCT 3000ML PPV (MISCELLANEOUS) ×2 IMPLANT
CATH ANGIO 5F BER2 100CM (CATHETERS) ×1 IMPLANT
CATH OMNI FLUSH 5F 65CM (CATHETERS) IMPLANT
CATH RETRIEVER CLOT 16MMX105CM (CATHETERS) ×1 IMPLANT
CATH ROBINSON RED A/P 18FR (CATHETERS) ×1 IMPLANT
CATH VISIONS PV .035 IVUS (CATHETERS) ×1 IMPLANT
CHLORAPREP W/TINT 26 (MISCELLANEOUS) ×1 IMPLANT
COVER DOME SNAP 22 D (MISCELLANEOUS) ×1 IMPLANT
COVER PROBE W GEL 5X96 (DRAPES) ×1 IMPLANT
COVER TRANSDUCER ULTRASND GEL (DISPOSABLE) ×1 IMPLANT
COVER ULTRASOUND PROBE 36 ST (MISCELLANEOUS) ×2 IMPLANT
DERMABOND ADVANCED (GAUZE/BANDAGES/DRESSINGS)
DERMABOND ADVANCED .7 DNX12 (GAUZE/BANDAGES/DRESSINGS) IMPLANT
DRAPE FEMORAL ANGIO 80X135IN (DRAPES) ×1 IMPLANT
DRAPE IMP U-DRAPE 54X76 (DRAPES) ×1 IMPLANT
DRAPE INCISE IOBAN 66X45 STRL (DRAPES) ×1 IMPLANT
DRSG COVADERM 4X6 (GAUZE/BANDAGES/DRESSINGS) ×1 IMPLANT
DRSG TEGADERM 4X4.75 (GAUZE/BANDAGES/DRESSINGS) ×2 IMPLANT
GAUZE 4X4 16PLY ~~LOC~~+RFID DBL (SPONGE) ×1 IMPLANT
GAUZE SPONGE 4X4 12PLY STRL (GAUZE/BANDAGES/DRESSINGS) ×1 IMPLANT
GLOVE BIOGEL PI IND STRL 7.5 (GLOVE) ×1 IMPLANT
GLOVE BIOGEL PI INDICATOR 7.5 (GLOVE) ×1
GLOVE SURG ENC MOIS LTX SZ7.5 (GLOVE) ×1 IMPLANT
GLOVE SURG NEOP MICRO LF SZ6.5 (GLOVE) ×2 IMPLANT
GLOVE SURG SS PI 7.5 STRL IVOR (GLOVE) ×2 IMPLANT
GOWN STRL REUS W/ TWL LRG LVL3 (GOWN DISPOSABLE) ×2 IMPLANT
GOWN STRL REUS W/ TWL XL LVL3 (GOWN DISPOSABLE) ×1 IMPLANT
GOWN STRL REUS W/TWL LRG LVL3 (GOWN DISPOSABLE) ×4
GOWN STRL REUS W/TWL XL LVL3 (GOWN DISPOSABLE) ×2
GUIDEWIRE ANGLED .035X260CM (WIRE) ×1 IMPLANT
KIT BASIN OR (CUSTOM PROCEDURE TRAY) ×2 IMPLANT
KIT ENCORE 26 ADVANTAGE (KITS) ×1 IMPLANT
KIT MICROPUNCTURE NIT STIFF (SHEATH) ×1 IMPLANT
KIT TURNOVER KIT B (KITS) ×2 IMPLANT
NS IRRIG 1000ML POUR BTL (IV SOLUTION) IMPLANT
PACK ENDO MINOR (CUSTOM PROCEDURE TRAY) ×2 IMPLANT
PACK UNIVERSAL I (CUSTOM PROCEDURE TRAY) ×1 IMPLANT
PAD ARMBOARD 7.5X6 YLW CONV (MISCELLANEOUS) ×4 IMPLANT
SET MICROPUNCTURE 5F STIFF (MISCELLANEOUS) ×2 IMPLANT
SHEATH CLOT RETRIEVER (SHEATH) ×1 IMPLANT
SHEATH PINNACLE 5FR (SHEATH) IMPLANT
SHEATH PINNACLE 8F 10CM (SHEATH) ×1 IMPLANT
SLEEVE ISOL F/PACE RF HD COVER (MISCELLANEOUS) ×1 IMPLANT
SPONGE GAUZE 2X2 8PLY STRL LF (GAUZE/BANDAGES/DRESSINGS) ×1 IMPLANT
STOPCOCK MORSE 400PSI 3WAY (MISCELLANEOUS) ×2 IMPLANT
SUT SILK 2 0 SH (SUTURE) ×1 IMPLANT
SYR CONTROL 10ML LL (SYRINGE) ×1 IMPLANT
SYR MEDRAD MARK V 150ML (SYRINGE) IMPLANT
TOWEL GREEN STERILE (TOWEL DISPOSABLE) ×4 IMPLANT
TOWEL GREEN STERILE FF (TOWEL DISPOSABLE) ×2 IMPLANT
TUBING HIGH PRESSURE 120CM (CONNECTOR) IMPLANT
WIRE BENTSON .035X145CM (WIRE) ×2 IMPLANT
WIRE EMERALD 3MM-J .035X150CM (WIRE) ×1 IMPLANT
WIRE TORQFLEX AUST .018X40CM (WIRE) ×7 IMPLANT

## 2021-08-19 NOTE — Interval H&P Note (Signed)
History and Physical Interval Note:  08/19/2021 1:58 PM  Marissa Gray  has presented today for surgery, with the diagnosis of DVT.  The various methods of treatment have been discussed with the patient and family. After consideration of risks, benefits and other options for treatment, the patient has consented to  Procedure(s): LEFT LOWER EXTREMITY DEEP VEIN THROMBOSIS MECHANICAL THROMBECTOMY WITH INARI DEVICE (Left) as a surgical intervention.  The patient's history has been reviewed, patient examined, no change in status, stable for surgery.  I have reviewed the patient's chart and labs.  Questions were answered to the patient's satisfaction.     [name]

## 2021-08-19 NOTE — Anesthesia Procedure Notes (Signed)
Procedure Name: Intubation Date/Time: 08/19/2021 2:19 PM Performed by: Marena Chancy, CRNA Pre-anesthesia Checklist: Patient identified, Emergency Drugs available, Suction available and Patient being monitored Patient Re-evaluated:Patient Re-evaluated prior to induction Oxygen Delivery Method: Circle System Utilized Preoxygenation: Pre-oxygenation with 100% oxygen Induction Type: IV induction Ventilation: Mask ventilation without difficulty and Oral airway inserted - appropriate to patient size Laryngoscope Size: Glidescope and 3 Grade View: Grade I Tube type: Oral Tube size: 6.5 mm Number of attempts: 1 Airway Equipment and Method: Stylet and Oral airway Placement Confirmation: ETT inserted through vocal cords under direct vision, positive ETCO2 and breath sounds checked- equal and bilateral Tube secured with: Tape Dental Injury: Teeth and Oropharynx as per pre-operative assessment

## 2021-08-19 NOTE — Progress Notes (Signed)
ANTICOAGULATION CONSULT NOTE    Pharmacy Consult for IV heparin Indication: new LLE DVT  Patient Measurements: Height 4 ft 11 inches Actual Weight 83.7 kg Heparin Dosing Weight: 63 kg (used [place] for initial dosing)  Vital Signs: Temp: 97.5 F (36.4 C) (08/23 0755) Temp Source: Oral (08/23 0755) BP: 107/67 (08/23 0805) Pulse Rate: 72 (08/23 0805)  Labs: Recent Labs    08/17/21 0357 08/18/21 1004 08/19/21 0155  HGB 7.7* 8.2* 7.9*  HCT 26.9* 27.7* 26.7*  PLT 360 302 293  HEPARINUNFRC 0.61 0.63 0.55  CREATININE  --  0.68  --     Estimated Creatinine Clearance: 87.2 mL/min (by C-G formula based on SCr of 0.68 mg/dL).  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 Found to have LLE DVT. Pharmacy consulted to dose IV heparin; Vascular Surgery was also consulted for possible intervention. Plan for thrombectomy 8/23  Heparin level therapeutic this AM  Day #6 of Vancomycin - > dc or LOT?  Goal of Therapy: Heparin level 0.3-0.7 units/ml Monitor platelets by anticoagulation protocol: Yes  Plan: Continue Heparin IV drip at 1550 units/hr Daily heparin level and CBC Monitor for signs and symptoms of bleeding  Thank you Okey Regal, PharmD  Please check AMION for all [facility] pharmacy phone numbers After 10:00 PM call main pharmacy [phone]

## 2021-08-19 NOTE — Op Note (Signed)
Patient name: Marissa Gray MRN: [id number] DOB: 10-14-1981 Sex: female  08/19/2021 Pre-operative Diagnosis: Left leg DVT Post-operative diagnosis:  Same Surgeon:  [name] Procedure Performed:  1.  Ultrasound-guided access, left popliteal vein  2.  Venography of the left popliteal, femoral, common femoral, external iliac, and common iliac vein as well as the inferior vena cava  3.  Ultrasound-guided access, left femoral vein  4.  Intravascular ultrasound (IVUS) of the left common femoral, external iliac, and common iliac vein and inferior vena cava  5.  INARI mechanical thrombectomy of the left femoral, common femoral, external iliac, and common iliac vein  6.  Venoplasty of the left common iliac, external iliac, common femoral, and femoral vein     Indications: This is a 40 year old female who has recently undergone hip surgery who developed significant left leg swelling.  Ultrasound identified a iliofemoral and femoral-popliteal DVT.  When she was admitted, she had profound cellulitis of her left leg.  This resolved with antibiotics and we discussed proceeding with intervention for DVT.  Procedure:  The patient was identified in the holding area and taken to room 16.  General endotracheal anesthesia was administered.  The patient was given antibiotics.  She was then placed prone on the table.  I then used ultrasound to evaluate the popliteal vein which was small in caliber.  I had significant difficulty cannulating the vein but was ultimately successful.  I had difficulty advancing a wire into the central venous system.  Venography was performed that showed multiple small tributary veins that did not drain into the iliac venous system.  I obtained several accesses in the popliteal vein but was unable to get the wire to advance centrally.  I brought in the vascular lab ultrasound for better visualization.  I was able to confirm that the wires and catheters were in the femoral vein but  again could not get them to advance essentially.  It appeared that this was a chronic process.  Ultimately had no success getting wire access to cross the venous system into the IVC and so I elected to stop from a popliteal approach and attempt femoral approach.  The patient was then placed supine on the table.  Again with the vascular ab assistance I identified the femoral vein.  I tried to cannulate this below the profunda vein however the wire could not be deflected centrally but 1 going down into the profunda vein.  I could not manipulate this because of the depth of the cannulation.  Ultimately I elected to access the femoral vein above the profunda vein.  This was done under ultrasound guidance with a micropuncture needle.  The wire was then easily directed into the IVC.  A micropuncture sheath was placed.  I then placed a Glidewire advantage into the right subclavian vein and a 8 French sheath was placed.  IVUS was then used to evaluate the femoral, common femoral, external iliac, common iliac veins as well as the IVC.  This showed what appeared to be somewhat chronic thrombus.  I was unable to identify any external compression.  I then selected the INARI device and perform mechanical thrombectomy of the common iliac, external iliac, common femoral, and femoral vein.  4 passes were made at 12:00, 3:00, 6:00, and 9:00.  All I was able to evacuate was chronic appearing thrombus.  I then reevaluated the venous system with IVUS and appeared to have removed the majority if not all of the thrombus.  I performed  venography which showed good opacification of the iliofemoral venous structures into the IVC.  I then performed balloon venoplasty of the common iliac, external iliac, femoral, and common femoral veins.  Follow-up venography showed a patent central venous system.  At this point I felt I had optimized her as best as I could as this appeared to be more of a chronic process.  Therefore elected to terminate  the procedure.  Wires and catheters were removed.  The sheath was then removed and a bolstered 2-0 silk suture was used to close the venotomy site.  The patient was then successfully extubated taken recovery room in stable condition.  There were no immediate complications.   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  #3  No obvious May Thurner was identified.  #4  The iliofemoral venous system was dilated using a 12 mm balloon    V. [name], M.D., [facility] Office: [phone] Pager:  [phone]

## 2021-08-19 NOTE — Progress Notes (Signed)
[facility] night shift telemetry coverage note.  The nursing staff stated that the patient has been having trouble sleeping and is very anxious at this time.  She has a history of Down's syndrome and her mother, who is her caregiver thinks that she probably needs something to help her relax/sleep as her anxiety seems to be more intense than usual.  Lorazepam 1 mg IVP x1 dose ordered.  Sanda Klein, MD.

## 2021-08-19 NOTE — Progress Notes (Signed)
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  I was able to perform mechanical thrombectomy and balloon venoplasty of the iliofemoral venous segments, removing almost entirely chronic thrombus.  She now has a patent ilio femoral venous system down to the profundofemoral vein.  No further intervention will be performed.  The patient can be started on oral anticoagulation tomorrow.  She will continue on her IV heparin tonight.  I will get her placed in thigh-high 20-30 compression stockings.  She may require custom-made stockings in the future.  From my perspective she can be discharged when medically stable.  [name]

## 2021-08-19 NOTE — Anesthesia Postprocedure Evaluation (Signed)
Anesthesia Post Note  Patient: Marissa Gray  Procedure(s) Performed: LEFT LOWER EXTREMITY DEEP VEIN THROMBOSIS MECHANICAL THROMBECTOMY WITH INARI DEVICE (Left: Leg Upper) ULTRASOUND GUIDANCE FOR VASCULAR ACCESS (Left: Leg Upper)     Patient location during evaluation: PACU Anesthesia Type: General Level of consciousness: awake Pain management: pain level controlled Vital Signs Assessment: post-procedure vital signs reviewed and stable Respiratory status: spontaneous breathing, nonlabored ventilation, respiratory function stable and patient connected to nasal cannula oxygen Cardiovascular status: blood pressure returned to baseline and stable Postop Assessment: no apparent nausea or vomiting Anesthetic complications: no   No notable events documented.  Last Vitals:  Vitals:   08/19/21 1935 08/19/21 1950  BP: 127/79 138/74  Pulse: 88   Resp: 18   Temp:    SpO2: 100%     Last Pain:  Vitals:   08/19/21 1950  TempSrc:   PainSc: 4     LLE Motor Response: Purposeful movement (08/19/21 1950) LLE Sensation: Full sensation (08/19/21 1950)          Lalitha Ilyas P Derotha Fishbaugh

## 2021-08-19 NOTE — Progress Notes (Signed)
Mother at the bedside throughout the morning. Mother assists with care of the patient.

## 2021-08-19 NOTE — Transfer of Care (Signed)
Immediate Anesthesia Transfer of Care Note  Patient: Marissa Gray  Procedure(s) Performed: LEFT LOWER EXTREMITY DEEP VEIN THROMBOSIS MECHANICAL THROMBECTOMY WITH INARI DEVICE (Left)  Patient Location: PACU  Anesthesia Type:General  Level of Consciousness: awake, alert  and oriented  Airway & Oxygen Therapy: Patient Spontanous Breathing  Post-op Assessment: Report given to RN  Post vital signs: Reviewed and stable  Last Vitals:  Vitals Value Taken Time  BP 146/92 08/19/21 1846  Temp    Pulse 93 08/19/21 1847  Resp 6 08/19/21 1847  SpO2 98 % 08/19/21 1847  Vitals shown include unvalidated device data.  Last Pain:  Vitals:   08/19/21 1312  TempSrc:   PainSc: 0-No pain      Patients Stated Pain Goal: 3 (08/13/21 1832)  Complications: No notable events documented.

## 2021-08-19 NOTE — Anesthesia Preprocedure Evaluation (Signed)
Anesthesia Evaluation  Patient identified by MRN, date of birth, ID band Patient awake    Reviewed: Allergy & Precautions, H&P , NPO status , Patient's Chart, lab work & pertinent test results  Airway Mallampati: III  TM Distance: >3 FB Neck ROM: Limited    Dental no notable dental hx. (+) Teeth Intact, Dental Advisory Given   Pulmonary asthma ,    Pulmonary exam normal breath sounds clear to auscultation       Cardiovascular negative cardio ROS   Rhythm:Regular Rate:Normal     Neuro/Psych negative neurological ROS  negative psych ROS   GI/Hepatic Neg liver ROS, GERD  Medicated,  Endo/Other  Hypothyroidism Morbid obesity  Renal/GU negative Renal ROS  negative genitourinary   Musculoskeletal  (+) Arthritis , Rheumatoid disorders,    Abdominal   Peds  Hematology negative hematology ROS (+)   Anesthesia Other Findings   Reproductive/Obstetrics negative OB ROS                             Anesthesia Physical Anesthesia Plan  ASA: 3  Anesthesia Plan: General   Post-op Pain Management:    Induction: Intravenous  PONV Risk Score and Plan: 4 or greater and Ondansetron, Dexamethasone and Midazolam  Airway Management Planned: Oral ETT and Video Laryngoscope Planned  Additional Equipment:   Intra-op Plan:   Post-operative Plan: Extubation in OR  Informed Consent: I have reviewed the patients History and Physical, chart, labs and discussed the procedure including the risks, benefits and alternatives for the proposed anesthesia with the patient or authorized representative who has indicated his/her understanding and acceptance.     Dental advisory given  Plan Discussed with: CRNA  Anesthesia Plan Comments:         Anesthesia Quick Evaluation

## 2021-08-19 NOTE — Progress Notes (Signed)
Progress Note    Marissa Gray  ZOX:[id number] DOB: Aug 10, 1981  DOA: 08/13/2021 PCP: Merri Brunette, MD      Brief Narrative:    Medical records reviewed and are as summarized below:  Marissa Gray is a 40 y.o. 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  She presented to [facility] long hospital with pain, swelling and redness of the left lower extremity.  She was admitted to the hospital for acute left lower extremity DVT and acute left lower extremity cellulitis.  She was treated with IV heparin, IV antibiotics and analgesics.  She was transferred to [facility] for thrombectomy.     Assessment/Plan:   Principal Problem:   DVT (deep venous thrombosis) (HCC) Active Problems:   Cellulitis of left lower extremity   Down's syndrome   Rheumatoid arthritis involving multiple sites (HCC)   Gastroesophageal reflux disease without esophagitis   Hypothyroidism   Acute left lower extremity DVT, acute superficial femoral vein thrombophlebitis: Hold IV heparin forthrombectomytoday.  Acute left lower extremity cellulitis: Plan to discontinue IV vancomycin after surgery.  Continue analgesics as needed for pain.  Iron deficiency anemia, anemia of chronic disease: H&H is stable.  Low normal vitamin B12 level (238), normal iron and transferrin saturation level, and low ferritin level.  S/p IV iron infusion on 08/17/2021.  Other comorbidities include hypothyroidism, asthma, rheumatoid arthritis, Down's syndrome  Plan of care was discussed with the patient.   Diet Order             Diet NPO time specified Except for: Sips with Meds  Diet effective midnight                      Consultants: Vascular surgeon  Procedures: Plan for thrombectomy  today    Medications:    vitamin C  500 mg Oral Daily   budesonide (PULMICORT) nebulizer solution  0.25 mg Nebulization BID   calcium carbonate  1 tablet Oral Q breakfast   cholecalciferol  2,000 Units Oral Daily   docusate sodium  100 mg Oral BID   ferrous sulfate  325 mg Oral Q breakfast   folic acid  3 mg Oral q AM   ketotifen  1 drop Both Eyes BID   levothyroxine  75 mcg Oral QAC breakfast   miconazole nitrate   Topical BID   montelukast  10 mg Oral QHS   pantoprazole  40 mg Oral Daily   sodium chloride flush  3 mL Intravenous Q12H   Continuous Infusions:  heparin 1,550 Units/hr (08/19/21 0308)   vancomycin Stopped (08/19/21 0029)     Anti-infectives (From admission, onward)    Start     Dose/Rate Route Frequency Ordered Stop   08/14/21 2200  vancomycin (VANCOREADY) IVPB 1250 mg/250 mL        1,250 mg 166.7 mL/hr over 90 Minutes Intravenous Every 24 hours 08/13/21 2204     08/13/21 2200  clindamycin (CLEOCIN) IVPB 600 mg  Status:  Discontinued        600 mg 100 mL/hr over 30 Minutes Intravenous Every 8 hours 08/13/21 2024 08/13/21 2031   08/13/21 2030  vancomycin (VANCOREADY) IVPB 2000 mg/400 mL        2,000 mg 200 mL/hr over 120 Minutes Intravenous  Once 08/13/21 2024 08/14/21 0230  Family Communication/Anticipated D/C date and plan/Code Status   DVT prophylaxis:      Code Status: Full Code  Family Communication: Mother at the bedside Disposition Plan:    Status is: Inpatient  Remains inpatient appropriate because:IV treatments appropriate due to intensity of illness or inability to take PO and Inpatient level of care appropriate due to severity of illness  Dispo: The patient is from: Home              Anticipated d/c is to: Home              Patient currently is not medically stable to d/c.   Difficult to place patient No           Subjective:   No new complaints.  She still has swelling on the left leg.  Pain in the  left leg is better.   Objective:    Vitals:   08/18/21 2318 08/19/21 0437 08/19/21 0755 08/19/21 0805  BP:  (!) 107/59  107/67  Pulse:  71 74 72  Resp:  Temp:  97.9 F (36.6 C) (!) 97.5 F (36.4 C)   TempSrc:  Oral Oral   SpO2:  96% 99% 100%  Weight: 83 kg     Height:  (1.499 m)      No data found.   Intake/Output Summary (Last 24 hours) at 08/19/2021 1122 Last data filed at 08/19/2021 0550 Gross per 24 hour  Intake 911.59 ml  Output --  Net 911.59 ml   Filed Weights   08/18/21 2318  Weight: 83 kg    Exam:  GEN: NAD SKIN: No rash EYES: EOMI ENT: MMM CV: RRR PULM: CTA B ABD: soft, ND, NT, +BS CNS: AAO x 3, non focal EXT: Significant swelling of the left lower extremity.  No tenderness or erythema.           Data Reviewed:   I have personally reviewed following labs and imaging studies:  Labs: Labs show the following:   Basic Metabolic Panel: Recent Labs  Lab 08/13/21 1726 08/14/21 0529 08/14/21 2227 08/16/21 0433 08/18/21 1004  NA 142 140  --   --   --   K 4.0 3.8  --   --   --   CL 110 111  --   --   --   CO2 22 22  --   --   --   GLUCOSE 90 84  --   --   --   BUN 13 13  --   --   --   CREATININE 0.62 0.64 0.90 0.82 0.68  CALCIUM 8.1* 8.2*  --   --   --    GFR Estimated Creatinine Clearance: 87.2 mL/min (by C-G formula based on SCr of 0.68 mg/dL). Liver Function Tests: Recent Labs  Lab 08/14/21 0529  AST 16  ALT 17  ALKPHOS 68  BILITOT 0.4  PROT 7.1  ALBUMIN 2.7*   No results for input(s): LIPASE, AMYLASE in the last 168 hours. No results for input(s): AMMONIA in the last 168 hours. Coagulation profile No results for input(s): INR, PROTIME in the last 168 hours.  CBC: Recent Labs  Lab 08/13/21 1726 08/14/21 0529 08/15/21 0630 08/16/21 0433 08/17/21 0357 08/18/21 1004 08/19/21 0155  WBC 5.9   < > 4.6 4.6 4.5 5.4 5.0  NEUTROABS 3.4  --   --   --   --   --   --   HGB 8.5*   < >  7.6* 8.1* 7.7* 8.2*  7.9*  HCT 29.3*   < > 25.5* 27.5* 26.9* 27.7* 26.7*  MCV 92.7   < > 91.1 91.7 93.7 91.7 92.4  PLT 350   < > 352 392 360 302 293   < > = values in this interval not displayed.   Cardiac Enzymes: No results for input(s): CKTOTAL, CKMB, CKMBINDEX, TROPONINI in the last 168 hours. BNP (last 3 results) No results for input(s): PROBNP in the last 8760 hours. CBG: No results for input(s): GLUCAP in the last 168 hours. D-Dimer: No results for input(s): DDIMER in the last 72 hours. Hgb A1c: No results for input(s): HGBA1C in the last 72 hours. Lipid Profile: No results for input(s): CHOL, HDL, LDLCALC, TRIG, CHOLHDL, LDLDIRECT in the last 72 hours. Thyroid function studies: No results for input(s): TSH, T4TOTAL, T3FREE, THYROIDAB in the last 72 hours.  Invalid input(s): FREET3 Anemia work up: No results for input(s): VITAMINB12, FOLATE, FERRITIN, TIBC, IRON, RETICCTPCT in the last 72 hours.  Sepsis Labs: Recent Labs  Lab 08/16/21 0433 08/17/21 0357 08/18/21 1004 08/19/21 0155  WBC 4.6 4.5 5.4 5.0    Microbiology Recent Results (from the past 240 hour(s))  Resp Panel by RT-PCR (Flu A&B, Covid) Nasopharyngeal Swab     Status: None   Collection Time: 08/13/21 10:24 PM   Specimen: Nasopharyngeal Swab; Nasopharyngeal(NP) swabs in vial transport medium  Result Value Ref Range Status   SARS Coronavirus 2 by RT PCR NEGATIVE NEGATIVE Final    Comment: (NOTE) SARS-CoV-2 target nucleic acids are NOT DETECTED.  The SARS-CoV-2 RNA is generally detectable in upper respiratory specimens during the acute phase of infection. The lowest concentration of SARS-CoV-2 viral copies this assay can detect is 138 copies/mL. A negative result does not preclude SARS-Cov-2 infection and should not be used as the sole basis for treatment or other patient management decisions. A negative result may occur with  improper specimen collection/handling, submission of specimen other than nasopharyngeal swab,  presence of viral mutation(s) within the areas targeted by this assay, and inadequate number of viral copies(<138 copies/mL). A negative result must be combined with clinical observations, patient history, and epidemiological information. The expected result is Negative.  Fact Sheet for Patients:  BloggerCourse.com  Fact Sheet for Healthcare Providers:  SeriousBroker.it  This test is no t yet approved or cleared by the [place] FDA and  has been authorized for detection and/or diagnosis of SARS-CoV-2 by FDA under an Emergency Use Authorization (EUA). This EUA will remain  in effect (meaning this test can be used) for the duration of the COVID-19 declaration under Section 564(b)(1) of the Act, 21 U.S.C.section 360bbb-3(b)(1), unless the authorization is terminated  or revoked sooner.       Influenza A by PCR NEGATIVE NEGATIVE Final   Influenza B by PCR NEGATIVE NEGATIVE Final    Comment: (NOTE) The Xpert Xpress SARS-CoV-2/FLU/RSV plus assay is intended as an aid in the diagnosis of influenza from Nasopharyngeal swab specimens and should not be used as a sole basis for treatment. Nasal washings and aspirates are unacceptable for Xpert Xpress SARS-CoV-2/FLU/RSV testing.  Fact Sheet for Patients: BloggerCourse.com  Fact Sheet for Healthcare Providers: SeriousBroker.it  This test is not yet approved or cleared by the [place] FDA and has been authorized for detection and/or diagnosis of SARS-CoV-2 by FDA under an Emergency Use Authorization (EUA). This EUA will remain in effect (meaning this test can be used) for the duration of the COVID-19 declaration under Section  564(b)(1) of the Act, 21 U.S.C. section 360bbb-3(b)(1), unless the authorization is terminated or revoked.  Performed at [facility], [address] [place], [place] [address]   MRSA  Next Gen by PCR, Nasal     Status: None   Collection Time: 08/14/21 11:45 PM   Specimen: Nasal Mucosa; Nasal Swab  Result Value Ref Range Status   MRSA by PCR Next Gen NOT DETECTED NOT DETECTED Final    Comment: (NOTE) The GeneXpert MRSA Assay (FDA approved for NASAL specimens only), is one component of a comprehensive MRSA colonization surveillance program. It is not intended to diagnose MRSA infection nor to guide or monitor treatment for MRSA infections. Test performance is not FDA approved in patients less than 27 years old. Performed at [facility], [address], [place], [place] [address]     Procedures and diagnostic studies:  No results found.             LOS: 6 days   Naketa Daddario  [facility]   Pager on [email or web address]. If 7PM-7AM, please contact night-coverage at [email or web address]     08/19/2021, 11:22 AM

## 2021-08-19 NOTE — Progress Notes (Signed)
Intraoperative ultrasound guidance with Dr. Myra Gianotti completed.  08/19/2021 6:19 PM Eula Fried., MHA, RVT, RDCS, RDMS

## 2021-08-20 ENCOUNTER — Encounter (HOSPITAL_COMMUNITY): Payer: Self-pay | Admitting: Surgery

## 2021-08-20 DIAGNOSIS — Q909 Down syndrome, unspecified: Secondary | ICD-10-CM

## 2021-08-20 DIAGNOSIS — K219 Gastro-esophageal reflux disease without esophagitis: Secondary | ICD-10-CM

## 2021-08-20 DIAGNOSIS — I824Y2 Acute embolism and thrombosis of unspecified deep veins of left proximal lower extremity: Secondary | ICD-10-CM | POA: Diagnosis not present

## 2021-08-20 DIAGNOSIS — L03116 Cellulitis of left lower limb: Secondary | ICD-10-CM | POA: Diagnosis not present

## 2021-08-20 DIAGNOSIS — E039 Hypothyroidism, unspecified: Secondary | ICD-10-CM

## 2021-08-20 DIAGNOSIS — M069 Rheumatoid arthritis, unspecified: Secondary | ICD-10-CM

## 2021-08-20 LAB — CREATININE, SERUM
Creatinine, Ser: 0.72 mg/dL (ref 0.44–1.00)
GFR, Estimated: >60 mL/min (ref >60)

## 2021-08-20 LAB — CBC
HCT: 24.6 % — ABNORMAL LOW (ref 36.0–46.0)
Hemoglobin: 7.4 g/dL — ABNORMAL LOW (ref 12.0–15.0)
MCH: 28 pg (ref 26.0–34.0)
MCHC: 30.1 g/dL (ref 30.0–36.0)
MCV: 93.2 fL (ref 80.0–100.0)
Platelets: 288 10*3/uL (ref 150–400)
RBC: 2.64 MIL/uL — ABNORMAL LOW (ref 3.87–5.11)
RDW: 21.6 % — ABNORMAL HIGH (ref 11.5–15.5)
WBC: 7.2 10*3/uL (ref 4.0–10.5)
nRBC: 0.4 % — ABNORMAL HIGH (ref 0.0–0.2)

## 2021-08-20 LAB — HEPARIN LEVEL (UNFRACTIONATED): Heparin Unfractionated: 0.64 IU/mL (ref 0.30–0.70)

## 2021-08-20 MED ORDER — RIVAROXABAN 10 MG PO TABS
10.0000 mg | ORAL_TABLET | Freq: Every day | ORAL | Status: DC
  Administered 2021-08-21 – 2021-08-22 (×2): 10 mg via ORAL
  Filled 2021-08-20 (×3): qty 1

## 2021-08-20 NOTE — Progress Notes (Signed)
PROGRESS NOTE  Marissa Gray ZOX:[id number] DOB: 04-Apr-1981 DOA: 08/13/2021 PCP: Merri Brunette, MD   LOS: 7 days   Brief narrative: Patient is a  40 y.o. 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  Patient was started on IV heparin antibiotics and was transferred from [facility] long hospital to [facility] for thrombectomy.  Assessment/Plan:  Principal Problem:   DVT (deep venous thrombosis) (HCC) Active Problems:   Down's syndrome   Rheumatoid arthritis involving multiple sites (HCC)   Gastroesophageal reflux disease without esophagitis   Hypothyroidism   Cellulitis of left lower extremity  Acute left lower extremity DVT, acute superficial femoral vein thrombophlebitis:  Vascular surgery was consulted and patient was noted to have chronic obstruction of femoral-popliteal system.  Mechanical thrombectomy and balloon venoplasty of the ileal femoral venous segments were done removing almost entirely chronic thrombus on 08/19/2021.  Patient is on heparin drip.  Vascular surgery recommended initiating oral anticoagulation starting 08/20/2021  Acute left lower extremity cellulitis:  On IV vancomycin.  Will discontinue IV vancomycin today.   Iron deficiency anemia, anemia of chronic disease:  Hemoglobin of 7.4.  Appears to be stable.  Vitamin B12 level low.  Status post IV iron infusion on 08/17/2021.    Other comorbidities include hypothyroidism, asthma, rheumatoid arthritis, Down's syndrome   DVT prophylaxis:   Heparin drip  Code Status: Full code  Family Communication: Spoke with the patient's father at bedside.  Status is: Inpatient  Remains  inpatient appropriate because:IV treatments appropriate due to intensity of illness or inability to take PO and Inpatient level of care appropriate due to severity of illness  Dispo: The patient is from: SNF              Anticipated d/c is to: SNF              Patient currently is not medically stable to d/c.   Difficult to place patient No   Consultants: Vascular surgery  Procedures: Mechanical thrombectomy on 08/19/2021  Anti-infectives:  Vancomycin  Anti-infectives (From admission, onward)    Start     Dose/Rate Route Frequency Ordered Stop   08/14/21 2200  vancomycin (VANCOREADY) IVPB 1250 mg/250 mL        1,250 mg 166.7 mL/hr over 90 Minutes Intravenous Every 24 hours 08/13/21 2204     08/13/21 2200  clindamycin (CLEOCIN) IVPB 600 mg  Status:  Discontinued        600 mg 100 mL/hr over 30 Minutes Intravenous Every 8 hours 08/13/21 2024 08/13/21 2031   08/13/21 2030  vancomycin (VANCOREADY) IVPB 2000 mg/400 mL        2,000 mg 200 mL/hr over 120 Minutes Intravenous  Once 08/13/21 2024 08/14/21 0230      Subjective:  Today, patient was seen and examined at bedside.  Denies any pain, nausea, vomiting, fever or chills.  Patient's father at bedside.  Complains of mild sore throat.    Objective: Vitals:   08/20/21 0744 08/20/21 0750  BP: 91/66   Pulse: 80   Resp: 16   Temp: 97.8 F (36.6 C)   SpO2:  100%    Intake/Output Summary (Last 24 hours) at 08/20/2021 0805 Last data filed  at 08/19/2021 1920 Gross per 24 hour  Intake 248.79 ml  Output 350 ml  Net -101.21 ml   Filed Weights   08/18/21 2318  Weight: 83 kg   Body mass index is 36.96 kg/m.   Physical Exam: GENERAL: Patient is alert awake and oriented. Not in obvious distress.  Obese. HENT: No scleral pallor or icterus. Pupils equally reactive to light. Oral mucosa is moist NECK: is supple, no gross swelling noted. CHEST: Clear to auscultation. No crackles or wheezes.  Diminished breath sounds bilaterally.   CABG scar noted. CVS: S1 and S2 heard, no murmur. Regular rate and rhythm.  ABDOMEN: Soft, non-tender, bowel sounds are present. EXTREMITIES: Left lower extremity wrapped. CNS: Cranial nerves are intact. No focal motor deficits. SKIN: warm and dry without rashes.  Data Review: I have personally reviewed the following laboratory data and studies,  CBC: Recent Labs  Lab 08/13/21 1726 08/14/21 0529 08/16/21 0433 08/17/21 0357 08/18/21 1004 08/19/21 0155 08/20/21 0555  WBC 5.9   < > 4.6 4.5 5.4 5.0 7.2  NEUTROABS 3.4  --   --   --   --   --   --   HGB 8.5*   < > 8.1* 7.7* 8.2* 7.9* 7.4*  HCT 29.3*   < > 27.5* 26.9* 27.7* 26.7* 24.6*  MCV 92.7   < > 91.7 93.7 91.7 92.4 93.2  PLT 350   < > 392 360 302 293 288   < > = values in this interval not displayed.   Basic Metabolic Panel: Recent Labs  Lab 08/13/21 1726 08/14/21 0529 08/14/21 2227 08/16/21 0433 08/18/21 1004 08/20/21 0555  NA 142 140  --   --   --   --   K 4.0 3.8  --   --   --   --   CL 110 111  --   --   --   --   CO2 22 22  --   --   --   --   GLUCOSE 90 84  --   --   --   --   BUN 13 13  --   --   --   --   CREATININE 0.62 0.64 0.90 0.82 0.68 0.72  CALCIUM 8.1* 8.2*  --   --   --   --    Liver Function Tests: Recent Labs  Lab 08/14/21 0529  AST 16  ALT 17  ALKPHOS 68  BILITOT 0.4  PROT 7.1  ALBUMIN 2.7*   No results for input(s): LIPASE, AMYLASE in the last 168 hours. No results for input(s): AMMONIA in the last 168 hours. Cardiac Enzymes: No results for input(s): CKTOTAL, CKMB, CKMBINDEX, TROPONINI in the last 168 hours. BNP (last 3 results) No results for input(s): BNP in the last 8760 hours.  ProBNP (last 3 results) No results for input(s): PROBNP in the last 8760 hours.  CBG: No results for input(s): GLUCAP in the last 168 hours. Recent Results (from the past 240 hour(s))  Resp Panel by RT-PCR (Flu A&B, Covid) Nasopharyngeal Swab     Status: None   Collection Time: 08/13/21 10:24 PM    Specimen: Nasopharyngeal Swab; Nasopharyngeal(NP) swabs in vial transport medium  Result Value Ref Range Status   SARS Coronavirus 2 by RT PCR NEGATIVE NEGATIVE Final    Comment: (NOTE) SARS-CoV-2 target nucleic acids are NOT DETECTED.  The SARS-CoV-2 RNA is generally detectable in upper respiratory specimens during the acute phase of infection. The lowest concentration  of SARS-CoV-2 viral copies this assay can detect is 138 copies/mL. A negative result does not preclude SARS-Cov-2 infection and should not be used as the sole basis for treatment or other patient management decisions. A negative result may occur with  improper specimen collection/handling, submission of specimen other than nasopharyngeal swab, presence of viral mutation(s) within the areas targeted by this assay, and inadequate number of viral copies(<138 copies/mL). A negative result must be combined with clinical observations, patient history, and epidemiological information. The expected result is Negative.  Fact Sheet for Patients:  BloggerCourse.com  Fact Sheet for Healthcare Providers:  SeriousBroker.it  This test is no t yet approved or cleared by the [place] FDA and  has been authorized for detection and/or diagnosis of SARS-CoV-2 by FDA under an Emergency Use Authorization (EUA). This EUA will remain  in effect (meaning this test can be used) for the duration of the COVID-19 declaration under Section 564(b)(1) of the Act, 21 U.S.C.section 360bbb-3(b)(1), unless the authorization is terminated  or revoked sooner.       Influenza A by PCR NEGATIVE NEGATIVE Final   Influenza B by PCR NEGATIVE NEGATIVE Final    Comment: (NOTE) The Xpert Xpress SARS-CoV-2/FLU/RSV plus assay is intended as an aid in the diagnosis of influenza from Nasopharyngeal swab specimens and should not be used as a sole basis for treatment. Nasal washings and aspirates are  unacceptable for Xpert Xpress SARS-CoV-2/FLU/RSV testing.  Fact Sheet for Patients: BloggerCourse.com  Fact Sheet for Healthcare Providers: SeriousBroker.it  This test is not yet approved or cleared by the [place] FDA and has been authorized for detection and/or diagnosis of SARS-CoV-2 by FDA under an Emergency Use Authorization (EUA). This EUA will remain in effect (meaning this test can be used) for the duration of the COVID-19 declaration under Section 564(b)(1) of the Act, 21 U.S.C. section 360bbb-3(b)(1), unless the authorization is terminated or revoked.  Performed at [facility], [address], [place], [place] [address]   MRSA Next Gen by PCR, Nasal     Status: None   Collection Time: 08/14/21 11:45 PM   Specimen: Nasal Mucosa; Nasal Swab  Result Value Ref Range Status   MRSA by PCR Next Gen NOT DETECTED NOT DETECTED Final    Comment: (NOTE) The GeneXpert MRSA Assay (FDA approved for NASAL specimens only), is one component of a comprehensive MRSA colonization surveillance program. It is not intended to diagnose MRSA infection nor to guide or monitor treatment for MRSA infections. Test performance is not FDA approved in patients less than 81 years old. Performed at [facility], [address], [place], [place] [address]      Studies: VAS US GUIDANCE INTRA OP  Result Date: 08/19/2021 CLINICAL DATA:  Ultrasound was provided for use by the ordering physician.  No provider Interpretation or professional fees incurred.    HYBRID OR IMAGING (MC ONLY)  Result Date: 08/19/2021 There is no interpretation for this exam.  This order is for images obtained during a surgical procedure.  Please See "Surgeries" Tab for more information regarding the procedure.      Joycelyn Das, MD  [facility] 08/20/2021  If 7PM-7AM, please contact night-coverage

## 2021-08-20 NOTE — Progress Notes (Signed)
    Subjective  - POD #1, status post left iliofemoral venous thrombectomy  No major complaints this morning   Physical Exam:  Left leg remains in a compression wrap No abdominal pain   Assessment/Plan:  POD #1  Patient can be transitioned to a DOAC, off of IV heparin.  She will need anticoagulation for 3 months and then referral to hematology.  The patient will need 20-30 compression stockings which I have ordered.  We may have issues with a proper fit and so she may need custom garments.  According to the patient she can go back to her group home on Friday.  Prior to leaving, she will need to have the bolstered suture in her left groin removed.  Wells Osborne Serio 08/20/2021 12:40 PM --  Vitals:   08/20/21 0744 08/20/21 0750  BP: 91/66   Pulse: 80   Resp: 16   Temp: 97.8 F (36.6 C)   SpO2:  100%    Intake/Output Summary (Last 24 hours) at 08/20/2021 1240 Last data filed at 08/20/2021 0907 Gross per 24 hour  Intake 810 ml  Output 350 ml  Net 460 ml     Laboratory CBC    Component Value Date/Time   WBC 7.2 08/20/2021 0555   HGB 7.4 (L) 08/20/2021 0555   HCT 24.6 (L) 08/20/2021 0555   PLT 288 08/20/2021 0555    BMET    Component Value Date/Time   NA 140 08/14/2021 0529   NA 139 07/30/2017 0000   K 3.8 08/14/2021 0529   CL 111 08/14/2021 0529   CO2 22 08/14/2021 0529   GLUCOSE 84 08/14/2021 0529   BUN 13 08/14/2021 0529   BUN 18 07/30/2017 0000   CREATININE 0.72 08/20/2021 0555   CALCIUM 8.2 (L) 08/14/2021 0529   CALCIUM 7.5 (L) 06/22/2017 0607   GFRNONAA >60 08/20/2021 0555   GFRAA >60 06/24/2017 0623    COAG Lab Results  Component Value Date   INR 1.2 05/18/2021   INR 1.0 04/14/2021   No results found for: PTT  Antibiotics Anti-infectives (From admission, onward)    Start     Dose/Rate Route Frequency Ordered Stop   08/14/21 2200  vancomycin (VANCOREADY) IVPB 1250 mg/250 mL  Status:  Discontinued        1,250 mg 166.7 mL/hr over 90  Minutes Intravenous Every 24 hours 08/13/21 2204 08/20/21 0831   08/13/21 2200  clindamycin (CLEOCIN) IVPB 600 mg  Status:  Discontinued        600 mg 100 mL/hr over 30 Minutes Intravenous Every 8 hours 08/13/21 2024 08/13/21 2031   08/13/21 2030  vancomycin (VANCOREADY) IVPB 2000 mg/400 mL        2,000 mg 200 mL/hr over 120 Minutes Intravenous  Once 08/13/21 2024 08/14/21 0230        V. Charlena Cross, M.D., [facility] [place] Office: [phone] Pager:  [phone]

## 2021-08-20 NOTE — Progress Notes (Addendum)
ANTICOAGULATION CONSULT NOTE    Pharmacy Consult for IV heparin Indication: new LLE DVT  Patient Measurements: Height 4 ft 11 inches Actual Weight 83.7 kg Heparin Dosing Weight: 63 kg (used [place] for initial dosing)  Vital Signs: Temp: 97.8 F (36.6 C) (08/24 0744) Temp Source: Oral (08/24 0744) BP: 91/66 (08/24 0744) Pulse Rate: 80 (08/24 0744)  Labs: Recent Labs    08/18/21 1004 08/19/21 0155 08/20/21 0555  HGB 8.2* 7.9* 7.4*  HCT 27.7* 26.7* 24.6*  PLT 302 293 288  HEPARINUNFRC 0.63 0.55 0.64  CREATININE 0.68  --  0.72    Estimated Creatinine Clearance: 87.2 mL/min (by C-G formula based on SCr of 0.72 mg/dL).  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 Found to have LLE DVT. Pharmacy consulted to dose IV heparin; Vascular Surgery was also consulted for possible intervention.   S/p thrombectomy  Heparin level therapeutic this AM  Day #7 of Vancomycin - > have stopped  Goal of Therapy: Heparin level 0.3-0.7 units/ml Monitor platelets by anticoagulation protocol: Yes  Plan: Continue Heparin IV drip at 1550 units/hr Daily heparin level and CBC Monitor for signs and symptoms of bleeding  Planning transition to oral AC  Thank you Okey Regal, PharmD  Please check AMION for all [facility] pharmacy phone numbers After 10:00 PM call main pharmacy [phone]

## 2021-08-21 DIAGNOSIS — L03116 Cellulitis of left lower limb: Secondary | ICD-10-CM | POA: Diagnosis not present

## 2021-08-21 DIAGNOSIS — K219 Gastro-esophageal reflux disease without esophagitis: Secondary | ICD-10-CM | POA: Diagnosis not present

## 2021-08-21 DIAGNOSIS — Q909 Down syndrome, unspecified: Secondary | ICD-10-CM | POA: Diagnosis not present

## 2021-08-21 DIAGNOSIS — I824Y2 Acute embolism and thrombosis of unspecified deep veins of left proximal lower extremity: Secondary | ICD-10-CM | POA: Diagnosis not present

## 2021-08-21 LAB — CBC
HCT: 26.1 % — ABNORMAL LOW (ref 36.0–46.0)
Hemoglobin: 7.6 g/dL — ABNORMAL LOW (ref 12.0–15.0)
MCH: 27.8 pg (ref 26.0–34.0)
MCHC: 29.1 g/dL — ABNORMAL LOW (ref 30.0–36.0)
MCV: 95.6 fL (ref 80.0–100.0)
Platelets: 293 10*3/uL (ref 150–400)
RBC: 2.73 MIL/uL — ABNORMAL LOW (ref 3.87–5.11)
RDW: 23.2 % — ABNORMAL HIGH (ref 11.5–15.5)
WBC: 5 10*3/uL (ref 4.0–10.5)
nRBC: 0.6 % — ABNORMAL HIGH (ref 0.0–0.2)

## 2021-08-21 NOTE — Progress Notes (Addendum)
PROGRESS NOTE  Marissa Gray SEG:[id number] DOB: 01-26-1981 DOA: 08/13/2021 PCP: Merri Brunette, MD   LOS: 8 days   Brief narrative:  Patient is a  40 y.o. 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  Patient was started on IV heparin, antibiotics and was transferred from [facility] hospital to [facility] for thrombectomy.  During hospitalization, patient has received thrombectomy and has otherwise remained stable.  Awaiting for transfer back to group home.  Assessment/Plan:  Principal Problem:   DVT (deep venous thrombosis) (HCC) Active Problems:   Down's syndrome   Rheumatoid arthritis involving multiple sites (HCC)   Gastroesophageal reflux disease without esophagitis   Hypothyroidism   Cellulitis of left lower extremity  Acute left lower extremity DVT, acute superficial femoral vein thrombophlebitis:   Vascular surgery was consulted and patient was noted to have chronic obstruction of femoral-popliteal system.  Mechanical thrombectomy and balloon venoplasty of the ileal femoral venous segments were done removing almost entirely the chronic thrombus on 08/19/2021.  Patient has been started on oral anticoagulation starting 08/20/2021 vascular surgery recommends follow-up with hematology in 3 months.  Acute left lower extremity cellulitis:  Was on vancomycin has been discontinued at this time.  On compression wrap of the left lower extremity   Iron deficiency anemia, anemia of chronic disease:  Hemoglobin of 7.4.  Appears to be stable.  Vitamin B12 level low.  Status post IV iron infusion on 08/17/2021.    Other comorbidities include  hypothyroidism, asthma, rheumatoid arthritis, Down's syndrome   DVT prophylaxis: rivaroxaban (XARELTO) tablet 10 mg Start: 08/21/21 0700    Code Status: Full code  Family Communication: Spoke with the patient's mother at bedside.  Status is: Inpatient  Remains inpatient appropriate because:IV treatments appropriate due to intensity of illness or inability to take PO and Inpatient level of care appropriate due to severity of illness  Dispo: The patient is from: Group home              Anticipated d/c is to: Group home              Patient currently is medically stable to d/c.  DC to group home tomorrow.   Difficult to place patient No  Consultants: Vascular surgery  Procedures: Mechanical thrombectomy on 08/19/2021  Anti-infectives:  None  Subjective:  Today, patient was seen and examined at bedside.  Denies any nausea, vomiting fever or chills.  Patient's mother at bedside.  Objective: Vitals:   08/21/21 0723 08/21/21 0800  BP:  110/60  Pulse:  81  Resp:  (!) 21  Temp:  (!) 97.4 F (36.3 C)  SpO2: 100% 99%    Intake/Output Summary (Last 24 hours) at 08/21/2021 1320 Last data filed at 08/21/2021 0800 Gross per 24 hour  Intake 240 ml  Output --  Net 240 ml    Filed Weights   08/18/21 2318  Weight: 83 kg   Body mass index is 36.96 kg/m.   Physical Exam:  GENERAL: Patient is alert awake and communicative, not in obvious distress.  Obese.  Down's facies HENT: No scleral pallor or icterus. Pupils equally reactive to light. Oral mucosa is moist NECK: is supple, no gross swelling noted.  CHEST: Clear to auscultation. No crackles or wheezes.  Diminished breath sounds bilaterally.  CABG scar noted. CVS: S1 and S2 heard, no murmur. Regular rate and rhythm.  ABDOMEN: Soft, non-tender, bowel sounds are present. EXTREMITIES: Left lower extremity on compression wrap.  Capillary refill present. CNS: Cranial nerves are intact. No focal motor deficits. SKIN: warm and dry  without rashes.  Data Review: I have personally reviewed the following laboratory data and studies,  CBC: Recent Labs  Lab 08/17/21 0357 08/18/21 1004 08/19/21 0155 08/20/21 0555 08/21/21 0913  WBC 4.5 5.4 5.0 7.2 5.0  HGB 7.7* 8.2* 7.9* 7.4* 7.6*  HCT 26.9* 27.7* 26.7* 24.6* 26.1*  MCV 93.7 91.7 92.4 93.2 95.6  PLT 360 302 293 288 293    Basic Metabolic Panel: Recent Labs  Lab 08/14/21 2227 08/16/21 0433 08/18/21 1004 08/20/21 0555  CREATININE 0.90 0.82 0.68 0.72    Liver Function Tests: No results for input(s): AST, ALT, ALKPHOS, BILITOT, PROT, ALBUMIN in the last 168 hours.  No results for input(s): LIPASE, AMYLASE in the last 168 hours. No results for input(s): AMMONIA in the last 168 hours. Cardiac Enzymes: No results for input(s): CKTOTAL, CKMB, CKMBINDEX, TROPONINI in the last 168 hours. BNP (last 3 results) No results for input(s): BNP in the last 8760 hours.  ProBNP (last 3 results) No results for input(s): PROBNP in the last 8760 hours.  CBG: No results for input(s): GLUCAP in the last 168 hours. Recent Results (from the past 240 hour(s))  Resp Panel by RT-PCR (Flu A&B, Covid) Nasopharyngeal Swab     Status: None   Collection Time: 08/13/21 10:24 PM   Specimen: Nasopharyngeal Swab; Nasopharyngeal(NP) swabs in vial transport medium  Result Value Ref Range Status   SARS Coronavirus 2 by RT PCR NEGATIVE NEGATIVE Final    Comment: (NOTE) SARS-CoV-2 target nucleic acids are NOT DETECTED.  The SARS-CoV-2 RNA is generally detectable in upper respiratory specimens during the acute phase of infection. The lowest concentration of SARS-CoV-2 viral copies this assay can detect is 138 copies/mL. A negative result does not preclude SARS-Cov-2 infection and should not be used as the sole basis for treatment or other patient management decisions. A negative result may occur with  improper specimen collection/handling, submission of specimen other than  nasopharyngeal swab, presence of viral mutation(s) within the areas targeted by this assay, and inadequate number of viral copies(<138 copies/mL). A negative result must be combined with clinical observations, patient history, and epidemiological information. The expected result is Negative.  Fact Sheet for Patients:  BloggerCourse.com  Fact Sheet for Healthcare Providers:  SeriousBroker.it  This test is no t yet approved or cleared by the [place] FDA and  has been authorized for detection and/or diagnosis of SARS-CoV-2 by FDA under an Emergency Use Authorization (EUA). This EUA will remain  in effect (meaning this test can be used) for the duration of the COVID-19 declaration under Section 564(b)(1) of the Act, 21 U.S.C.section 360bbb-3(b)(1), unless the authorization is terminated  or revoked sooner.       Influenza A by PCR NEGATIVE NEGATIVE Final   Influenza B by PCR NEGATIVE NEGATIVE Final    Comment: (NOTE) The Xpert Xpress SARS-CoV-2/FLU/RSV plus assay is intended as an aid in the diagnosis of influenza from Nasopharyngeal swab specimens and should not be used as a sole basis for treatment. Nasal washings and aspirates are unacceptable for Xpert Xpress SARS-CoV-2/FLU/RSV testing.  Fact Sheet for Patients: BloggerCourse.com  Fact Sheet for Healthcare Providers: SeriousBroker.it  This test is not yet approved or cleared by the [place] and has been authorized for detection and/or diagnosis of SARS-CoV-2 by FDA under an Emergency Use Authorization (EUA). This EUA will remain in effect (meaning this test can be used) for the duration of the COVID-19 declaration under Section 564(b)(1) of the Act, 21 U.S.C. section 360bbb-3(b)(1), unless the authorization is terminated or revoked.  Performed at [facility], [address], [place], [place] [address]   MRSA Next Gen by PCR, Nasal     Status: None   Collection Time: 08/14/21 11:45 PM   Specimen: Nasal Mucosa; Nasal Swab  Result Value Ref Range Status   MRSA by PCR Next Gen NOT DETECTED NOT DETECTED Final    Comment: (NOTE) The GeneXpert MRSA Assay (FDA approved for NASAL specimens only), is one component of a comprehensive MRSA colonization surveillance program. It is not intended to diagnose MRSA infection nor to guide or monitor treatment for MRSA infections. Test performance is not FDA approved in patients less than 71 years old. Performed at [facility], [address], [place], [place] [address]       Studies: VAS US GUIDANCE INTRA OP  Result Date: 08/19/2021 CLINICAL DATA:  Ultrasound was provided for use by the ordering physician.  No provider Interpretation or professional fees incurred.    HYBRID OR IMAGING ([facility] ONLY)  Result Date: 08/19/2021 There is no interpretation for this exam.  This order is for images obtained during a surgical procedure.  Please See "Surgeries" Tab for more information regarding the procedure.      Joycelyn Das, MD  [facility] 08/21/2021  If 7PM-7AM, please contact night-coverage

## 2021-08-21 NOTE — Discharge Instructions (Addendum)
Information on my medicine - XARELTO® (Rivaroxaban) ° °Why was Xarelto® prescribed for you? °Xarelto® was prescribed for you to reduce the risk of blood clots forming. The medical term for these abnormal blood clots is venous thromboembolism (VTE). ° °What do you need to know about xarelto® ? °Take your Xarelto® ONCE DAILY at the same time every day. °You may take it either with or without food. ° °If you have difficulty swallowing the tablet whole, you may crush it and mix in applesauce just prior to taking your dose. ° °Take Xarelto® exactly as prescribed by your doctor and DO NOT stop taking Xarelto® without talking to the doctor who prescribed the medication.  Stopping without other VTE prevention medication to take the place of Xarelto® may increase your risk of developing a clot. ° °After discharge, you should have regular check-up appointments with your healthcare provider that is prescribing your Xarelto®.   ° °What do you do if you miss a dose? °If you miss a dose, take it as soon as you remember on the same day then continue your regularly scheduled once daily regimen the next day. Do not take two doses of Xarelto® on the same day.  ° °Important Safety Information °A possible side effect of Xarelto® is bleeding. You should call your healthcare provider right away if you experience any of the following: °? Bleeding from an injury or your nose that does not stop. °? Unusual colored urine (red or dark brown) or unusual colored stools (red or black). °? Unusual bruising for unknown reasons. °? A serious fall or if you hit your head (even if there is no bleeding). ° °Some medicines may interact with Xarelto® and might increase your risk of bleeding while on Xarelto®. To help avoid this, consult your healthcare provider or pharmacist prior to using any new prescription or non-prescription medications, including herbals, vitamins, non-steroidal anti-inflammatory drugs (NSAIDs) and supplements. ° °This website has  more information on Xarelto®: www.xarelto.com. ° ° ° °

## 2021-08-21 NOTE — TOC Progression Note (Signed)
Transition of Care [facility]) - Progression Note    Patient Details  Name: Marissa Gray MRN: [id number] Date of Birth: [birth year]  Transition of Care [facility]) CM/SW Contact  Erin Sons, [place] Phone Number: 08/21/2021, 2:28 PM  Clinical Narrative:     Pt from [facility]. House manager is currently out of town Rennis Chris is covering [phone]. CSW called and left message with Christiane Ha on confidential voicmail notifying him of plan for DC. CSW requested call back.        Expected Discharge Plan and Services                                                 Social Determinants of Health (SDOH) Interventions    Readmission Risk Interventions No flowsheet data found.

## 2021-08-21 NOTE — Progress Notes (Signed)
    Subjective  - POD #2  Feels leg is less swolen   Physical Exam:  Operative dressing in place Abd soft Decreased left leg edema   Assessment/Plan:  POD #2  Transitioned to oral anticoagulation Will need hematology follow up in 3 months Remove surgical dressing and place compression socks.  May need custom socks Remove left groin suture prior tot discharge    Marissa Gray 08/21/2021 10:01 AM --  Vitals:   08/21/21 0723 08/21/21 0800  BP:  110/60  Pulse:  81  Resp:  (!) 21  Temp:  (!) 97.4 F (36.3 C)  SpO2: 100% 99%    Intake/Output Summary (Last 24 hours) at 08/21/2021 1001 Last data filed at 08/20/2021 1300 Gross per 24 hour  Intake 360 ml  Output 1 ml  Net 359 ml     Laboratory CBC    Component Value Date/Time   WBC 7.2 08/20/2021 0555   HGB 7.4 (L) 08/20/2021 0555   HCT 24.6 (L) 08/20/2021 0555   PLT 288 08/20/2021 0555    BMET    Component Value Date/Time   NA 140 08/14/2021 0529   NA 139 07/30/2017 0000   K 3.8 08/14/2021 0529   CL 111 08/14/2021 0529   CO2 22 08/14/2021 0529   GLUCOSE 84 08/14/2021 0529   BUN 13 08/14/2021 0529   BUN 18 07/30/2017 0000   CREATININE 0.72 08/20/2021 0555   CALCIUM 8.2 (L) 08/14/2021 0529   CALCIUM 7.5 (L) 06/22/2017 0607   GFRNONAA >60 08/20/2021 0555   GFRAA >60 06/24/2017 0623    COAG Lab Results  Component Value Date   INR 1.2 05/18/2021   INR 1.0 04/14/2021   No results found for: PTT  Antibiotics Anti-infectives (From admission, onward)    Start     Dose/Rate Route Frequency Ordered Stop   08/14/21 2200  vancomycin (VANCOREADY) IVPB 1250 mg/250 mL  Status:  Discontinued        1,250 mg 166.7 mL/hr over 90 Minutes Intravenous Every 24 hours 08/13/21 2204 08/20/21 0831   08/13/21 2200  clindamycin (CLEOCIN) IVPB 600 mg  Status:  Discontinued        600 mg 100 mL/hr over 30 Minutes Intravenous Every 8 hours 08/13/21 2024 08/13/21 2031   08/13/21 2030  vancomycin (VANCOREADY) IVPB 2000  mg/400 mL        2,000 mg 200 mL/hr over 120 Minutes Intravenous  Once 08/13/21 2024 08/14/21 0230        V. Charlena Cross, M.D., [facility] [place] Office: [phone] Pager:  [phone]

## 2021-08-22 DIAGNOSIS — K219 Gastro-esophageal reflux disease without esophagitis: Secondary | ICD-10-CM | POA: Diagnosis not present

## 2021-08-22 DIAGNOSIS — I824Y2 Acute embolism and thrombosis of unspecified deep veins of left proximal lower extremity: Secondary | ICD-10-CM | POA: Diagnosis not present

## 2021-08-22 DIAGNOSIS — Q909 Down syndrome, unspecified: Secondary | ICD-10-CM | POA: Diagnosis not present

## 2021-08-22 DIAGNOSIS — L03116 Cellulitis of left lower limb: Secondary | ICD-10-CM | POA: Diagnosis not present

## 2021-08-22 LAB — CREATININE, SERUM
Creatinine, Ser: 0.82 mg/dL (ref 0.44–1.00)
GFR, Estimated: >60 mL/min (ref >60)

## 2021-08-22 LAB — CBC
HCT: 25.2 % — ABNORMAL LOW (ref 36.0–46.0)
Hemoglobin: 7.4 g/dL — ABNORMAL LOW (ref 12.0–15.0)
MCH: 27.9 pg (ref 26.0–34.0)
MCHC: 29.4 g/dL — ABNORMAL LOW (ref 30.0–36.0)
MCV: 95.1 fL (ref 80.0–100.0)
Platelets: 260 10*3/uL (ref 150–400)
RBC: 2.65 MIL/uL — ABNORMAL LOW (ref 3.87–5.11)
RDW: 23.6 % — ABNORMAL HIGH (ref 11.5–15.5)
WBC: 4.5 10*3/uL (ref 4.0–10.5)
nRBC: 0.7 % — ABNORMAL HIGH (ref 0.0–0.2)

## 2021-08-22 MED ORDER — RIVAROXABAN 10 MG PO TABS: 10.0000 mg | ORAL_TABLET | Freq: Every day | ORAL | 0 refills | Status: AC

## 2021-08-22 NOTE — Discharge Summary (Signed)
Physician Discharge Summary  CLINT STRUPP ZOX:[id number] DOB: [birth year] DOA: 08/13/2021  PCP: Merri Brunette, MD  Admit date: 08/13/2021 Discharge date: 08/22/2021  Admitted From: Group Home  Discharge disposition: Group home  Recommendations for Outpatient Follow-Up:   Follow up with your primary care provider at the group home in 1 to 2 weeks Check CBC, BMP, magnesium in the next visit Follow-up with vascular surgery in the clinic as outpatient. Vascular surgery recommends follow-up with hematology in 3 months for anemia and anticoagulation options.  Will need oral anticoagulation for at least 3 months.   Discharge Diagnosis:   Principal Problem:   DVT (deep venous thrombosis) (HCC) Active Problems:   Down's syndrome   Rheumatoid arthritis involving multiple sites (HCC)   Gastroesophageal reflux disease without esophagitis   Hypothyroidism   Cellulitis of left lower extremity   Discharge Condition: Improved.  Diet recommendation: Low sodium, heart healthy.    Wound care: None.  Code status: Full.   History of Present Illness:   Patient is a  40 y.o. 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  Patient was started on IV heparin, antibiotics and was transferred [facility] to [facility] for thrombectomy.     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  Mechanical  thrombectomy and balloon venoplasty of the ileal femoral venous segments were done removing almost entirely the chronic thrombus on 08/19/2021.  Patient has been started on oral anticoagulation starting 08/20/2021 vascular surgery recommends follow-up with hematology in 3 months.  Will need oral anticoagulation for at least 3 months.  We will continue Xarelto on discharge.  Acute left lower extremity cellulitis:  Was on vancomycin has been discontinued at this time.  On compression wrap of the left lower extremity   Iron deficiency anemia, anemia of chronic disease:  Hemoglobin of 7.4.  Appears to be stable.  Vitamin B12 level low.  Status post IV iron infusion on 08/17/2021.     Other comorbidities included hypothyroidism, asthma, rheumatoid arthritis, Down's syndrome.  These remained stable during hospitalization   Disposition.  At this time, patient is stable for disposition home with outpatient PCP, vascular surgery and hematology follow-up  Medical Consultants:   Vascular surgery  Procedures:    Mechanical thrombectomy on 08/19/2021 Subjective:   Today, patient was seen and examined at bedside.  Denies any nausea, vomiting fever or chills.  Patient's mother at bedside.  Discharge Exam:   Vitals:   08/22/21 0724 08/22/21 1041  BP:  (!) 115/55  Pulse:  76  Resp:  (!) 24  Temp:    SpO2: 96% 100%   Vitals:   08/21/21 2011 08/22/21 0424 08/22/21 0724 08/22/21 1041  BP: (!) 121/58 (!) 115/55  (!) 115/55  Pulse: 78 70  76  Resp: 15 18  (!) 24  Temp: (!) 97.4 F (36.3 C) 97.9 F (36.6 C)    TempSrc: Axillary Oral    SpO2: 100% 100% 96% 100%  Weight:  Height:       General: Alert awake, not in obvious distress, communicative, Down's facies. HENT: pupils equally reacting to light,  No scleral pallor or icterus noted. Oral mucosa is moist.  Chest:  Clear breath sounds.  Diminished breath sounds bilaterally. No crackles or wheezes.  CVS: S1 &S2 heard. No murmur.  Regular  rate and rhythm. Abdomen: Soft, nontender, nondistended.  Bowel sounds are heard.   Extremities: Left lower extremity on compression wrap Psych: Alert, awake and communicative,  CNS:  No cranial nerve deficits.  Moves all extremities Skin: Warm and dry.    The results of significant diagnostics from this hospitalization (including imaging, microbiology, ancillary and laboratory) are listed below for reference.     Diagnostic Studies:   VAS [place] LOWER EXTREMITY VENOUS (DVT) (ONLY [facility] & [facility])  Result Date: 08/13/2021  Lower Venous DVT Study Patient Name:  GRESIA ISIDORO  Date of Exam:   08/13/2021 Medical Rec #: [id number]        Accession #:    [id number] Date of Birth: Jul 30, 1981         Patient Gender: F Patient Age:   38 years Exam Location:  [facility] Procedure:      VAS [place] LOWER EXTREMITY VENOUS (DVT) Referring Phys: Parke Poisson --------------------------------------------------------------------------------  Indications: Pain, Swelling, and Erythema.  Risk Factors: None identified. Limitations: Body habitus, poor ultrasound/tissue interface and patient positioning, patient pain tolerance. Comparison Study: No prior studies. Performing Technologist: Chanda Busing RVT  Examination Guidelines: A complete evaluation includes B-mode imaging, spectral Doppler, color Doppler, and power Doppler as needed of all accessible portions of each vessel. Bilateral testing is considered an integral part of a complete examination. Limited examinations for reoccurring indications may be performed as noted. The reflux portion of the exam is performed with the patient in reverse Trendelenburg.  +-----+---------------+---------+-----------+----------+--------------+ RIGHTCompressibilityPhasicitySpontaneityPropertiesThrombus Aging +-----+---------------+---------+-----------+----------+--------------+ CFV  Full           Yes      Yes                                  +-----+---------------+---------+-----------+----------+--------------+   +---------+---------------+---------+-----------+----------+-------------------+ LEFT     CompressibilityPhasicitySpontaneityPropertiesThrombus Aging      +---------+---------------+---------+-----------+----------+-------------------+ CFV      None           No       No                   Acute               +---------+---------------+---------+-----------+----------+-------------------+ SFJ      None                                         Acute               +---------+---------------+---------+-----------+----------+-------------------+ FV Prox  None           No       No                   Acute               +---------+---------------+---------+-----------+----------+-------------------+ FV Mid                  No       No  Acute               +---------+---------------+---------+-----------+----------+-------------------+ FV Distal               No       No                   Acute               +---------+---------------+---------+-----------+----------+-------------------+ PFV                                                   Not well visualized +---------+---------------+---------+-----------+----------+-------------------+ POP      None           No       No                   Acute               +---------+---------------+---------+-----------+----------+-------------------+ PTV      Full                                                             +---------+---------------+---------+-----------+----------+-------------------+ PERO                                                  Not well visualized +---------+---------------+---------+-----------+----------+-------------------+ GSV      None                                         Acute               +---------+---------------+---------+-----------+----------+-------------------+ EIV                      No       Yes                                      +---------+---------------+---------+-----------+----------+-------------------+ CIV                                                   Not visualized      +---------+---------------+---------+-----------+----------+-------------------+     Summary: RIGHT: - No evidence of common femoral vein obstruction.  LEFT: - Findings consistent with acute deep vein thrombosis involving the left common femoral vein, left femoral vein, and left popliteal vein. - Findings consistent with acute superficial vein thrombosis involving the left great saphenous vein. - No cystic structure found in the popliteal fossa.  *See table(s) above for measurements and observations. Electronically signed by Coral Else MD on 08/13/2021 at 10:42:01 PM.    Final      Labs:   Basic Metabolic Panel: Recent Labs  Lab 08/16/21 [phone]  08/18/21 1004 08/20/21 0555 08/22/21 0543  CREATININE 0.82 0.68 0.72 0.82   GFR Estimated Creatinine Clearance: 85.1 mL/min (by C-G formula based on SCr of 0.82 mg/dL). Liver Function Tests: No results for input(s): AST, ALT, ALKPHOS, BILITOT, PROT, ALBUMIN in the last 168 hours. No results for input(s): LIPASE, AMYLASE in the last 168 hours. No results for input(s): AMMONIA in the last 168 hours. Coagulation profile No results for input(s): INR, PROTIME in the last 168 hours.  CBC: Recent Labs  Lab 08/18/21 1004 08/19/21 0155 08/20/21 0555 08/21/21 0913 08/22/21 0543  WBC 5.4 5.0 7.2 5.0 4.5  HGB 8.2* 7.9* 7.4* 7.6* 7.4*  HCT 27.7* 26.7* 24.6* 26.1* 25.2*  MCV 91.7 92.4 93.2 95.6 95.1  PLT 302 293 288 293 260   Cardiac Enzymes: No results for input(s): CKTOTAL, CKMB, CKMBINDEX, TROPONINI in the last 168 hours. BNP: Invalid input(s): POCBNP CBG: No results for input(s): GLUCAP in the last 168 hours. D-Dimer No results for input(s): DDIMER in the last 72 hours. Hgb A1c No results for input(s): HGBA1C in  the last 72 hours. Lipid Profile No results for input(s): CHOL, HDL, LDLCALC, TRIG, CHOLHDL, LDLDIRECT in the last 72 hours. Thyroid function studies No results for input(s): TSH, T4TOTAL, T3FREE, THYROIDAB in the last 72 hours.  Invalid input(s): FREET3 Anemia work up No results for input(s): VITAMINB12, FOLATE, FERRITIN, TIBC, IRON, RETICCTPCT in the last 72 hours. Microbiology Recent Results (from the past 240 hour(s))  Resp Panel by RT-PCR (Flu A&B, Covid) Nasopharyngeal Swab     Status: None   Collection Time: 08/13/21 10:24 PM   Specimen: Nasopharyngeal Swab; Nasopharyngeal(NP) swabs in vial transport medium  Result Value Ref Range Status   SARS Coronavirus 2 by RT PCR NEGATIVE NEGATIVE Final    Comment: (NOTE) SARS-CoV-2 target nucleic acids are NOT DETECTED.  The SARS-CoV-2 RNA is generally detectable in upper respiratory specimens during the acute phase of infection. The lowest concentration of SARS-CoV-2 viral copies this assay can detect is 138 copies/mL. A negative result does not preclude SARS-Cov-2 infection and should not be used as the sole basis for treatment or other patient management decisions. A negative result may occur with  improper specimen collection/handling, submission of specimen other than nasopharyngeal swab, presence of viral mutation(s) within the areas targeted by this assay, and inadequate number of viral copies(<138 copies/mL). A negative result must be combined with clinical observations, patient history, and epidemiological information. The expected result is Negative.  Fact Sheet for Patients:  BloggerCourse.com  Fact Sheet for Healthcare Providers:  SeriousBroker.it  This test is no t yet approved or cleared by the [place] FDA and  has been authorized for detection and/or diagnosis of SARS-CoV-2 by FDA under an Emergency Use Authorization (EUA). This EUA will remain  in effect  (meaning this test can be used) for the duration of the COVID-19 declaration under Section 564(b)(1) of the Act, 21 U.S.C.section 360bbb-3(b)(1), unless the authorization is terminated  or revoked sooner.       Influenza A by PCR NEGATIVE NEGATIVE Final   Influenza B by PCR NEGATIVE NEGATIVE Final    Comment: (NOTE) The Xpert Xpress SARS-CoV-2/FLU/RSV plus assay is intended as an aid in the diagnosis of influenza from Nasopharyngeal swab specimens and should not be used as a sole basis for treatment. Nasal washings and aspirates are unacceptable for Xpert Xpress SARS-CoV-2/FLU/RSV testing.  Fact Sheet for Patients: BloggerCourse.com  Fact Sheet for Healthcare Providers: SeriousBroker.it  This test is not yet approved  or cleared by the [place] and has been authorized for detection and/or diagnosis of SARS-CoV-2 by FDA under an Emergency Use Authorization (EUA). This EUA will remain in effect (meaning this test can be used) for the duration of the COVID-19 declaration under Section 564(b)(1) of the Act, 21 U.S.C. section 360bbb-3(b)(1), unless the authorization is terminated or revoked.  Performed at [facility], [address] [place], [place] [address]   MRSA Next Gen by PCR, Nasal     Status: None   Collection Time: 08/14/21 11:45 PM   Specimen: Nasal Mucosa; Nasal Swab  Result Value Ref Range Status   MRSA by PCR Next Gen NOT DETECTED NOT DETECTED Final    Comment: (NOTE) The GeneXpert MRSA Assay (FDA approved for NASAL specimens only), is one component of a comprehensive MRSA colonization surveillance program. It is not intended to diagnose MRSA infection nor to guide or monitor treatment for MRSA infections. Test performance is not FDA approved in patients less than 63 years old. Performed at [facility], [address], [place], [place] [address]      Discharge Instructions:    Discharge Instructions     Call MD for:  severe uncontrolled pain   Complete by: As directed    Call MD for:  temperature >100.4   Complete by: As directed    Diet general   Complete by: As directed    Discharge instructions   Complete by: As directed    Follow-up with hematology in 3 months.  Continue blood thinners for at least 3 months. Continue compression wrap. Follow up with Vascular surgery as has been scheduled.   Increase activity slowly   Complete by: As directed    No wound care   Complete by: As directed       Allergies as of 08/22/2021       Reactions   Cefaclor Hives, Rash   Other reaction(s): Unknown   Penicillins Hives   Has patient had a PCN reaction causing immediate rash, facial/tongue/throat swelling, SOB or lightheadedness with hypotension: Unknown Has patient had a PCN reaction causing severe rash involving mucus membranes or skin necrosis: Unknown Has patient had a PCN reaction that required hospitalization: Unknown Has patient had a PCN reaction occurring within the last 10 years: Unknown If all of the above answers are "NO", then may proceed with Cephalosporin use. Other reaction(s): rash, Unknown   Sulfa Antibiotics Hives, Other (See Comments)   FEVER Other reaction(s): Unknown        Medication List     STOP taking these medications    doxycycline 100 MG capsule Commonly known as: Vibramycin   HYDROcodone-acetaminophen 5-325 MG tablet Commonly known as: NORCO/VICODIN   methocarbamol 500 MG tablet Commonly known as: Robaxin   vitamin B-12 1000 MCG tablet Commonly known as: CYANOCOBALAMIN       TAKE these medications    acetaminophen 500 MG tablet Commonly known as: TYLENOL Take 500 mg by mouth 3 (three) times daily as needed for moderate pain or fever.   albuterol 108 (90 Base) MCG/ACT inhaler Commonly known as: VENTOLIN HFA Inhale 2 puffs into the lungs every 6 (six) hours as needed for wheezing or shortness of breath.    azithromycin 500 MG tablet Commonly known as: ZITHROMAX Take 500 mg by mouth as directed. Take 1 tablet (500 mg) prior to Dental Procedure   calcium carbonate 600 MG Tabs tablet Commonly known as: OS-CAL Take 600 mg by mouth daily.  diclofenac Sodium 1 % Gel Commonly known as: VOLTAREN Apply 2 g topically daily as needed (pain).   docusate sodium 100 MG capsule Commonly known as: COLACE Take 100 mg by mouth 2 (two) times daily.   fluticasone 110 MCG/ACT inhaler Commonly known as: FLOVENT HFA Inhale 1 puff into the lungs 2 (two) times daily.   folic acid 1 MG tablet Commonly known as: FOLVITE Take 3 mg by mouth in the morning. (0700)   ketotifen 0.025 % ophthalmic solution Commonly known as: ZADITOR Place 1 drop into both eyes 2 (two) times daily.   levonorgestrel-ethinyl estradiol 0.1-20 MG-MCG tablet Commonly known as: ALESSE Take 1 tablet by mouth in the morning. (0700)   levothyroxine 75 MCG tablet Commonly known as: SYNTHROID Take 75 mcg by mouth daily before breakfast.   loratadine 10 MG tablet Commonly known as: CLARITIN Take 10 mg by mouth daily as needed for allergies.   methotrexate 2.5 MG tablet Commonly known as: RHEUMATREX Take 7.5-10 mg by mouth as directed. Take 3 tablets (7.5 mg) on Thurs Morning & Take 4 tablets (10 mg) on Thurs Evening. Caution:Chemotherapy. Protect from light.   miconazole 2 % powder Commonly known as: MICOTIN Apply 1 application topically in the morning and at bedtime.   mineral oil-hydrophilic petrolatum ointment Apply 1 application topically daily.   montelukast 10 MG tablet Commonly known as: SINGULAIR Take 10 mg by mouth at bedtime.   MULTIVITAMIN ADULTS PO Take 1 tablet by mouth daily.   OcuSoft Eyelid Cleansing Pads Place 1 application into both eyes in the morning and at bedtime. Apply to Eyelid and eyelash topically two times a day for Dry eyes   omeprazole 40 MG capsule Commonly known as: PRILOSEC Take 40  mg by mouth daily.   potassium chloride 10 MEQ CR capsule Commonly known as: MICRO-K Take 10 mEq by mouth 2 (two) times daily. (0700 & 2000)   pseudoephedrine 30 MG tablet Commonly known as: SUDAFED Take 30 mg by mouth every 4 (four) hours as needed for congestion.   psyllium 0.52 g capsule Commonly known as: REGULOID Take 0.52 g by mouth daily.   rivaroxaban 10 MG Tabs tablet Commonly known as: XARELTO Take 1 tablet (10 mg total) by mouth daily with breakfast.   Slow Fe 142 (45 Fe) MG Tbcr Generic drug: Ferrous Sulfate Take 1 tablet by mouth daily.   sodium chloride 0.65 % nasal spray Commonly known as: OCEAN Place 1 spray into the nose 2 (two) times daily. (0700 & 2000)   Vitamin C 500 MG Chew Chew 1 tablet by mouth daily.   Vitamin D 50 MCG (2000 UT) tablet Take 2,000 Units by mouth daily.        Follow-up Information     Merri Brunette, MD Follow up.   Specialty: Family Medicine Contact information: [phone] [address] [place] [address] [phone]                  Time coordinating discharge: 39 minutes  Signed:  Cambell Stanek  [facility] 08/22/2021, 2:37 PM

## 2021-08-22 NOTE — Progress Notes (Signed)
  Progress Note    08/22/2021 8:21 AM 3 Days Post-Op  Subjective:  no complaints   Vitals:   08/22/21 0424 08/22/21 0724  BP: (!) 115/55   Pulse: 70   Resp: 18   Temp: 97.9 F (36.6 C)   SpO2: 100% 96%   Physical Exam: General: well appearing, well nourished, very pleasant Cardiac:  regular Lungs:  non labored Extremities:  left groin and popliteal access sites without swelling or hematoma. No ecchymosis. Bolsters removed. Dry dressings in place. Left leg still somewhat swollen  Abdomen:  obese, soft, non tender Neurologic: alert and oriented  CBC    Component Value Date/Time   WBC 4.5 08/22/2021 0543   RBC 2.65 (L) 08/22/2021 0543   HGB 7.4 (L) 08/22/2021 0543   HCT 25.2 (L) 08/22/2021 0543   PLT 260 08/22/2021 0543   MCV 95.1 08/22/2021 0543   MCH 27.9 08/22/2021 0543   MCHC 29.4 (L) 08/22/2021 0543   RDW 23.6 (H) 08/22/2021 0543   LYMPHSABS 1.9 08/13/2021 1726   MONOABS 0.4 08/13/2021 1726   EOSABS 0.1 08/13/2021 1726   BASOSABS 0.0 08/13/2021 1726    BMET    Component Value Date/Time   NA 140 08/14/2021 0529   NA 139 07/30/2017 0000   K 3.8 08/14/2021 0529   CL 111 08/14/2021 0529   CO2 22 08/14/2021 0529   GLUCOSE 84 08/14/2021 0529   BUN 13 08/14/2021 0529   BUN 18 07/30/2017 0000   CREATININE 0.82 08/22/2021 0543   CALCIUM 8.2 (L) 08/14/2021 0529   CALCIUM 7.5 (L) 06/22/2017 0607   GFRNONAA >60 08/22/2021 0543   GFRAA >60 06/24/2017 0623    INR    Component Value Date/Time   INR 1.2 05/18/2021 0352     Intake/Output Summary (Last 24 hours) at 08/22/2021 0821 Last data filed at 08/21/2021 1500 Gross per 24 hour  Intake 480 ml  Output --  Net 480 ml     Assessment/Plan:  40 y.o. female is s/p   1.  Ultrasound-guided access, left popliteal vein            2.  Venography of the left popliteal, femoral, common femoral, external iliac, and common iliac vein as well as the inferior vena cava            3.  Ultrasound-guided access, left  femoral vein            4.  Intravascular ultrasound (IVUS) of the left common femoral, external iliac, and common iliac vein and inferior vena cava            5.  INARI mechanical thrombectomy of the left femoral, common femoral, external iliac, and common iliac vein            6.  Venoplasty of the left common iliac, external iliac, common femoral, and femoral vein   Doing well post intervention. LLE well perfused and warm. Swelling is improving Started on Xarelto 10 mg yesterday. She will need to continue this Compression stockings daily thigh high Has follow up arranged in our office on 10/3 with iliac/ivc and LLE venous duplex   Graceann Congress, PA-C [facility] [phone] 08/22/2021 8:21 AM

## 2021-08-22 NOTE — TOC Transition Note (Signed)
Transition of Care [facility]) - CM/SW Discharge Note   Patient Details  Name: Marissa Gray MRN: [id number] Date of Birth: 1981/07/09  [facility]) CM/SW Contact:  Erin Sons, LCSW Phone Number: 08/22/2021, 9:48 AM   Clinical Narrative:     CSW called pt Group Home; Rennis Chris is covering as Production designer, theatre/television/film [phone]. He informed CSW that pt was active with [facility] for PT. Requests meds sent to [facility], [facility] in [place]. He informs CSW that pt's mom plans to transport upon DC.   CSW contact [facility] and informed them of DC. They will pull HH resumption orders from Epic. TOC to sign off; please reach out if there are any additional concerns/needs.   Final next level of care: Group Home Barriers to Discharge: No Barriers Identified   Patient Goals and CMS Choice        Discharge Placement                       Discharge Plan and Services                            [facility] Agency: [facility]        Social Determinants of Health (SDOH) Interventions     Readmission Risk Interventions No flowsheet data found.

## 2021-09-04 ENCOUNTER — Telehealth: Payer: Self-pay | Admitting: Physician Assistant

## 2021-09-04 NOTE — Telephone Encounter (Signed)
Scheduled appt per 9/8 referral. Pt's mother is aware of appt date and time.

## 2021-09-22 ENCOUNTER — Other Ambulatory Visit: Payer: Self-pay | Admitting: *Deleted

## 2021-09-22 DIAGNOSIS — I824Y2 Acute embolism and thrombosis of unspecified deep veins of left proximal lower extremity: Secondary | ICD-10-CM

## 2021-09-23 ENCOUNTER — Encounter: Payer: Medicaid Other | Admitting: Physician Assistant

## 2021-09-23 ENCOUNTER — Other Ambulatory Visit: Payer: Medicaid Other

## 2021-09-24 ENCOUNTER — Encounter: Payer: Medicaid Other | Admitting: Physician Assistant

## 2021-09-24 ENCOUNTER — Other Ambulatory Visit: Payer: Medicaid Other

## 2021-09-29 ENCOUNTER — Encounter (HOSPITAL_COMMUNITY): Payer: Medicaid Other

## 2021-09-29 ENCOUNTER — Encounter: Payer: Medicaid Other | Admitting: Surgery

## 2021-10-13 NOTE — Progress Notes (Signed)
[facility] [phone]   [phone]  INITIAL CONSULT NOTE  Patient Care Team: Merri Brunette, MD as PCP - General (Family Medicine)  Hematological/Oncological History 1) 08/13/2021-08/22/2021: Admitted for acute left lower extremity DVT and acute superficial femoral vein thrombophlebitis.  -Doppler US of lower extremity confirmed  acute DVT involving the left common femoral vein, left femoral vein, and left popliteal vein. Additionally, acute superficial vein thrombosis involving the left great saphenous vein.  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  Mechanical thrombectomy and balloon venoplasty of the ileal femoral venous segments were done removing almost entirely the chronic thrombus on 08/19/2021 -Xarelto was started on 08/20/2021.  -Found to have anemia secondary to iron deficiency s/p IV iron infusion x 1 dose. Vitamin B12 level low.   2) 10/14/2021: Establish care at [facility] Hematology/Oncology  CHIEF COMPLAINTS/PURPOSE OF CONSULTATION:  "Acute left leg DVT"  HISTORY OF PRESENTING ILLNESS:  Marissa Gray 40 y.o. female with medical history significant for down's syndrme, GERD hashimoto's disease, heart murmur and rheumatoid arthritis.  On exam today, Marissa Gray reports overall her energy levels and appetite are fairly stable.  She currently resides in a group home.  Since initiating anticoagulation with Xarelto, patient reports that her swelling has improved in her left leg and the redness has resolved.  She denies any signs of bleeding or large bruising.  She denies any nausea, vomiting or abdominal pain.  Her bowel movements are unchanged.  She adds that she is no longer on birth control since the hospitalization in August.  Patient denies any fevers, chills, night sweats, shortness of breath, chest pain or cough.  She has no other complaints.  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   Dermatitis    left leg    Down's syndrome    GERD (gastroesophageal reflux disease)    Hashimoto's disease    Heart murmur    Hypothyroidism    Pneumonia    hx of years ago    Rheumatoid arthritis (HCC)    Strabismus     SURGICAL HISTORY: Past Surgical History:  Procedure Laterality Date   CARDIAC SURGERY     ASD and vSD repair    FOOT ARTHRODESIS, TRIPLE     bilateral    I & D EXTREMITY Left 05/19/2021   Procedure: IRRIGATION AND DEBRIDEMENT LEFT HIP WITH POSSIBLE HEAD AND LINER EXCHANGE;  Surgeon: Samson Frederic, MD;  Location: [facility];  Service: Orthopedics;  Laterality: Left;   MECHANICAL THROMBECTOMY WITH AORTOGRAM AND INTERVENTION Left 08/19/2021   Procedure: LEFT LOWER EXTREMITY DEEP VEIN THROMBOSIS MECHANICAL THROMBECTOMY WITH INARI DEVICE;  Surgeon: Nada Libman, MD;  Location: [facility] OR;  Service: Vascular;  Laterality: Left;   ORIF TIBIA PLATEAU Right 06/22/2017   Procedure: OPEN REDUCTION INTERNAL FIXATION (ORIF) TIBIAL PLATEAU;  Surgeon: Myrene Galas, MD;  Location: [facility] OR;  Service: Orthopedics;  Laterality: Right;   STRABISMUS SURGERY     TOTAL HIP ARTHROPLASTY Left 04/23/2021   Procedure: TOTAL HIP ARTHROPLASTY-Posterior;  Surgeon: Ollen Gross, MD;  Location: [facility];  Service: Orthopedics;  Laterality: Left;    ulcer surgery      ULTRASOUND GUIDANCE FOR VASCULAR ACCESS Left 08/19/2021  Procedure: ULTRASOUND GUIDANCE FOR VASCULAR ACCESS;  Surgeon: Nada Libman, MD;  Location: [facility] OR;  Service: Vascular;  Laterality: Left;    SOCIAL HISTORY: Social History   Socioeconomic History   Marital status: Single    Spouse name: Not on file   Number of children: Not on file   Years of education: Not on file    Highest education level: Not on file  Occupational History   Not on file  Tobacco Use   Smoking status: Never   Smokeless tobacco: Never  Vaping Use   Vaping Use: Never used  Substance and Sexual Activity   Alcohol use: No   Drug use: No   Sexual activity: Not on file  Other Topics Concern   Not on file  Social History Narrative   Not on file   Social Determinants of Health   Financial Resource Strain: Not on file  Food Insecurity: Not on file  Transportation Needs: Not on file  Physical Activity: Not on file  Stress: Not on file  Social Connections: Not on file  Intimate Partner Violence: Not on file    FAMILY HISTORY: Family History  Problem Relation Age of Onset   Heart disease Neg Hx     ALLERGIES:  is allergic to cefaclor, penicillins, and sulfa antibiotics.  MEDICATIONS:  Current Outpatient Medications  Medication Sig Dispense Refill   acetaminophen (TYLENOL) 500 MG tablet Take 500 mg by mouth 3 (three) times daily as needed for moderate pain or fever.     albuterol (VENTOLIN HFA) 108 (90 Base) MCG/ACT inhaler Inhale 2 puffs into the lungs every 6 (six) hours as needed for wheezing or shortness of breath.     Ascorbic Acid (VITAMIN C) 500 MG CHEW Chew 1 tablet by mouth daily.     azithromycin (ZITHROMAX) 500 MG tablet Take 500 mg by mouth as directed. Take 1 tablet (500 mg) prior to Dental Procedure     calcium carbonate (OS-CAL) 600 MG TABS tablet Take 600 mg by mouth daily.     Cholecalciferol (VITAMIN D) 50 MCG (2000 UT) tablet Take 2,000 Units by mouth daily.     diclofenac Sodium (VOLTAREN) 1 % GEL Apply 2 g topically daily as needed (pain).     docusate sodium (COLACE) 100 MG capsule Take 100 mg by mouth 2 (two) times daily.     Eyelid Cleansers (OCUSOFT EYELID CLEANSING) PADS Place 1 application into both eyes in the morning and at bedtime. Apply to Eyelid and eyelash topically two times a day for Dry eyes     Ferrous Sulfate (SLOW FE) 142 (45 Fe) MG TBCR  Take 1 tablet by mouth daily.     fluticasone (FLOVENT HFA) 110 MCG/ACT inhaler Inhale 1 puff into the lungs 2 (two) times daily.     folic acid (FOLVITE) 1 MG tablet Take 3 mg by mouth in the morning. (0700)     ketotifen (ZADITOR) 0.025 % ophthalmic solution Place 1 drop into both eyes 2 (two) times daily.     levothyroxine (SYNTHROID) 75 MCG tablet Take 75 mcg by mouth daily before breakfast.     loratadine (CLARITIN) 10 MG tablet Take 10 mg by mouth daily as needed for allergies.     methotrexate (RHEUMATREX) 2.5 MG tablet Take 7.5-10 mg by mouth as directed. Take 3 tablets (7.5 mg) on Thurs Morning & Take 4 tablets (10 mg) on Thurs Evening. Caution:Chemotherapy. Protect from light.     miconazole (MICOTIN) 2 % powder Apply 1  application topically in the morning and at bedtime.     mineral oil-hydrophilic petrolatum (AQUAPHOR) ointment Apply 1 application topically daily.     montelukast (SINGULAIR) 10 MG tablet Take 10 mg by mouth at bedtime.     Multiple Vitamins-Minerals (MULTIVITAMIN ADULTS PO) Take 1 tablet by mouth daily.     omeprazole (PRILOSEC) 40 MG capsule Take 40 mg by mouth daily.     potassium chloride (MICRO-K) 10 MEQ CR capsule Take 10 mEq by mouth 2 (two) times daily. (0700 & 2000)     pseudoephedrine (SUDAFED) 30 MG tablet Take 30 mg by mouth every 4 (four) hours as needed for congestion.     psyllium (REGULOID) 0.52 g capsule Take 0.52 g by mouth daily.     rivaroxaban (XARELTO) 10 MG TABS tablet Take 1 tablet (10 mg total) by mouth daily with breakfast. 360 tablet 0   sodium chloride (OCEAN) 0.65 % nasal spray Place 1 spray into the nose 2 (two) times daily. (0700 & 2000)     vitamin B-12 (CYANOCOBALAMIN) 1000 MCG tablet Take 1 tablet (1,000 mcg total) by mouth daily. 30 tablet 3   No current facility-administered medications for this visit.    REVIEW OF SYSTEMS:   Constitutional: ( - ) fevers, ( - )  chills , ( - ) night sweats Eyes: ( - ) blurriness of vision, ( - )  double vision, ( - ) watery eyes Ears, nose, mouth, throat, and face: ( - ) mucositis, ( - ) sore throat Respiratory: ( - ) cough, ( - ) dyspnea, ( - ) wheezes Cardiovascular: ( - ) palpitation, ( - ) chest discomfort, ( + ) lower extremity swelling Gastrointestinal:  ( - ) nausea, ( - ) heartburn, ( - ) change in bowel habits Skin: ( - ) abnormal skin rashes Lymphatics: ( - ) new lymphadenopathy, ( - ) easy bruising Neurological: ( - ) numbness, ( - ) tingling, ( - ) new weaknesses Behavioral/Psych: ( - ) mood change, ( - ) new changes  All other systems were reviewed with the patient and are negative.  PHYSICAL EXAMINATION: ECOG PERFORMANCE STATUS: 1 - Symptomatic but completely ambulatory  Vitals:   10/14/21 1152  BP: (!) 112/56  Pulse: 83  Resp: 20  Temp: 98 F (36.7 C)  SpO2: 100%   Filed Weights   10/14/21 1152  Weight: 183 lb 3.2 oz (83.1 kg)    GENERAL: well appearing female in NAD  SKIN: skin color, texture, turgor are normal, no rashes or significant lesions EYES: conjunctiva are pink and non-injected, sclera clear OROPHARYNX: no exudate, no erythema; lips, buccal mucosa, and tongue normal  LUNGS: clear to auscultation and percussion with normal breathing effort HEART: regular rate & rhythm and no murmurs and no lower extremity edema ABDOMEN: soft, non-tender, non-distended, normal bowel sounds Musculoskeletal: no cyanosis of digits and no clubbing  PSYCH: alert & oriented x 3, fluent speech   LABORATORY DATA:  I have reviewed the data as listed CBC Latest Ref Rng & Units 10/14/2021 08/22/2021 08/21/2021  WBC 4.0 - 10.5 K/uL 4.9 4.5 5.0  Hemoglobin 12.0 - 15.0 g/dL 11.1(L) 7.4(L) 7.6(L)  Hematocrit 36.0 - 46.0 % 34.4(L) 25.2(L) 26.1(L)  Platelets 150 - 400 K/uL 295 260 293    CMP Latest Ref Rng & Units 10/14/2021 08/22/2021 08/20/2021  Glucose 70 - 99 mg/dL 83 - -  BUN 6 - 20 mg/dL 9 - -  Creatinine 5.63 - 1.00 mg/dL 8.75 6.43  0.72  Sodium 135 - 145 mmol/L  140 - -  Potassium 3.5 - 5.1 mmol/L 4.3 - -  Chloride 98 - 111 mmol/L 109 - -  CO2 22 - 32 mmol/L 20(L) - -  Calcium 8.9 - 10.3 mg/dL 8.6(V) - -  Total Protein 6.5 - 8.1 g/dL 8.0 - -  Total Bilirubin 0.3 - 1.2 mg/dL 0.3 - -  Alkaline Phos 38 - 126 U/L 109 - -  AST 15 - 41 U/L 20 - -  ALT 0 - 44 U/L 22 - -    ASSESSMENT & PLAN AMBERT VIRRUETA is a 40 y.o. female who presents to the clinic for evaluation for history of left lower leg DVT currently on Xarelto.  We reviewed provoking factors for venous thromboembolisms including recent surgery, infectious process, estrogen based hormone therapy, prolonged immobility, recent travel and smoking.  I discussed with the patient and her mother that the likely cause of her left lower leg DVT was multifocal including recent left hip replacement that was complicated by cellulitis requiring I&D.  Additionally patient was on estrogen-based birth control when she developed the DVT.  For these reasons, we recommend anticoagulation for total of 6 months.  Additionally, patient was found to have anemia during her hospitalization in August 2022.  Labs indicated iron deficiency anemia and borderline vitamin B12 deficiency.  Patient received 1 dose of IV iron infusion.  Patient will proceed with laboratory evaluation today and we will check CBC, CMP, iron and TIBC, ferritin, reticulocyte panel, vitamin B12 level and methylmalonic acid levels.  Patient is currently on ferrous sulfate once daily.  If labs indicate persistent iron deficiency anemia, we will recommend IV iron infusions.  #Left Leg DVT: --Felt to be provoked by left hip arthroplasty in April 2022 complicated by serous wound drainage status post I&D and surgical exploration in May 2022. Additionally, patient was on oral contraceptives when she was diagnosed with DVT. --Recommend to discontinue current OCPs and switch to progesterone based hormone therapy if needed.  --Currently on Xarelto 20 mg once daily  with good tolerance. Recommend to continue for a total of 6 months. --Reviewed risk factors that can increase risk for future VTEs and ways to minimize recurrent episodes.   #Normocytc Anemia: --Likely multifocal due to RA currently on methrotrexate, iron deficiency anemia and possibly vitamin B12 deficiency.  --S/p IV venofer x 1 on 08/17/2021.  --Currently on Slow Fe (45 mg of elemental iron) once daily.  --Labs today to check CBC, CMP, iron and TIBC, ferritin, reticulocyte panel, vitamin B12 level and methylmalonic acid levels.   --Recommend IV venofer if there is persistent iron deficiency anemia and Hgb <10  --RTC in 3 months with labs prior.   Orders Placed This Encounter  Procedures   CBC with Differential (Cancer Center Only)    Standing Status:   Future    Number of Occurrences:   1    Standing Expiration Date:   10/13/2022   CMP (Cancer Center only)    Standing Status:   Future    Number of Occurrences:   1    Standing Expiration Date:   10/13/2022   Ferritin    Standing Status:   Future    Number of Occurrences:   1    Standing Expiration Date:   10/13/2022   Iron and TIBC    Standing Status:   Future    Number of Occurrences:   1    Standing Expiration Date:   10/13/2022  Retic Panel    Standing Status:   Future    Number of Occurrences:   1    Standing Expiration Date:   10/13/2022   Vitamin B12    Standing Status:   Future    Number of Occurrences:   1    Standing Expiration Date:   10/13/2022   Methylmalonic acid, serum    Standing Status:   Future    Number of Occurrences:   1    Standing Expiration Date:   10/13/2022    All questions were answered. The patient knows to call the clinic with any problems, questions or concerns.  I have spent a total of 60 minutes minutes of face-to-face and non-face-to-face time, preparing to see the patient, obtaining and/or reviewing separately obtained history, performing a medically appropriate examination, counseling  and educating the patient, ordering tests, documenting clinical information in the electronic health record, and care coordination.   Georga Kaufmann, PA-C Department of Hematology/Oncology [facility] at [facility] Phone: [phone]   Patient was seen with Dr. Leonides Schanz.   I have read the above note and personally examined the patient. I agree with the assessment and plan as noted above.  Briefly Marissa Gray is a 40 year old female with medical history significant for Down syndrome who presents for evaluation of a provoked lower extremity DVT.  At this time it appears to have been provoked by estrogen-based birth control.  We would recommend transitioning to a progestin only birth control moving forward.  Additionally the patient was found to have anemia which is likely multifactorial in nature including iron deficiency, inflammation from rheumatoid arthritis, and possible vitamin B12 deficiency.  We will order full anemia work-up today in order to further evaluate this.   Ulysees Barns, MD Department of Hematology/Oncology [facility] at [facility] Phone: [phone] Pager: [phone] Email: [email or web address]

## 2021-10-14 ENCOUNTER — Inpatient Hospital Stay: Payer: Medicaid Other

## 2021-10-14 ENCOUNTER — Inpatient Hospital Stay: Payer: Medicaid Other | Attending: Physician Assistant | Admitting: Physician Assistant

## 2021-10-14 ENCOUNTER — Other Ambulatory Visit: Payer: Self-pay

## 2021-10-14 VITALS — BP 112/56 | HR 83 | Temp 98.0°F | Resp 20 | Ht 59.0 in | Wt 183.2 lb

## 2021-10-14 DIAGNOSIS — I824Y2 Acute embolism and thrombosis of unspecified deep veins of left proximal lower extremity: Secondary | ICD-10-CM

## 2021-10-14 DIAGNOSIS — E063 Autoimmune thyroiditis: Secondary | ICD-10-CM | POA: Insufficient documentation

## 2021-10-14 DIAGNOSIS — Z86718 Personal history of other venous thrombosis and embolism: Secondary | ICD-10-CM | POA: Diagnosis not present

## 2021-10-14 DIAGNOSIS — Q909 Down syndrome, unspecified: Secondary | ICD-10-CM | POA: Insufficient documentation

## 2021-10-14 DIAGNOSIS — D649 Anemia, unspecified: Secondary | ICD-10-CM

## 2021-10-14 DIAGNOSIS — E538 Deficiency of other specified B group vitamins: Secondary | ICD-10-CM | POA: Diagnosis not present

## 2021-10-14 DIAGNOSIS — M069 Rheumatoid arthritis, unspecified: Secondary | ICD-10-CM | POA: Insufficient documentation

## 2021-10-14 DIAGNOSIS — Z793 Long term (current) use of hormonal contraceptives: Secondary | ICD-10-CM

## 2021-10-14 DIAGNOSIS — D509 Iron deficiency anemia, unspecified: Secondary | ICD-10-CM | POA: Insufficient documentation

## 2021-10-14 DIAGNOSIS — Z7901 Long term (current) use of anticoagulants: Secondary | ICD-10-CM | POA: Diagnosis not present

## 2021-10-14 LAB — CMP (CANCER CENTER ONLY)
ALT: 22 U/L (ref 0–44)
AST: 20 U/L (ref 15–41)
Albumin: 3.5 g/dL (ref 3.5–5.0)
Alkaline Phosphatase: 109 U/L (ref 38–126)
Anion gap: 11 (ref 5–15)
BUN: 9 mg/dL (ref 6–20)
CO2: 20 mmol/L — ABNORMAL LOW (ref 22–32)
Calcium: 8.7 mg/dL — ABNORMAL LOW (ref 8.9–10.3)
Chloride: 109 mmol/L (ref 98–111)
Creatinine: 0.76 mg/dL (ref 0.44–1.00)
GFR, Estimated: >60 mL/min (ref >60)
Glucose, Bld: 83 mg/dL (ref 70–99)
Potassium: 4.3 mmol/L (ref 3.5–5.1)
Sodium: 140 mmol/L (ref 135–145)
Total Bilirubin: 0.3 mg/dL (ref 0.3–1.2)
Total Protein: 8 g/dL (ref 6.5–8.1)

## 2021-10-14 LAB — CBC WITH DIFFERENTIAL (CANCER CENTER ONLY)
Abs Immature Granulocytes: 0.01 10*3/uL (ref 0.00–0.07)
Basophils Absolute: 0 10*3/uL (ref 0.0–0.1)
Basophils Relative: 0 %
Eosinophils Absolute: 0 10*3/uL (ref 0.0–0.5)
Eosinophils Relative: 0 %
HCT: 34.4 % — ABNORMAL LOW (ref 36.0–46.0)
Hemoglobin: 11.1 g/dL — ABNORMAL LOW (ref 12.0–15.0)
Immature Granulocytes: 0 %
Lymphocytes Relative: 35 %
Lymphs Abs: 1.7 10*3/uL (ref 0.7–4.0)
MCH: 32.7 pg (ref 26.0–34.0)
MCHC: 32.3 g/dL (ref 30.0–36.0)
MCV: 101.5 fL — ABNORMAL HIGH (ref 80.0–100.0)
Monocytes Absolute: 0.2 10*3/uL (ref 0.1–1.0)
Monocytes Relative: 5 %
Neutro Abs: 2.9 10*3/uL (ref 1.7–7.7)
Neutrophils Relative %: 60 %
Platelet Count: 295 10*3/uL (ref 150–400)
RBC: 3.39 MIL/uL — ABNORMAL LOW (ref 3.87–5.11)
RDW: 23 % — ABNORMAL HIGH (ref 11.5–15.5)
WBC Count: 4.9 10*3/uL (ref 4.0–10.5)
nRBC: 0 % (ref 0.0–0.2)

## 2021-10-14 LAB — FERRITIN: Ferritin: 385 ng/mL — ABNORMAL HIGH (ref 11–307)

## 2021-10-14 LAB — RETIC PANEL
Immature Retic Fract: 26.5 % — ABNORMAL HIGH (ref 2.3–15.9)
RBC.: 3.33 MIL/uL — ABNORMAL LOW (ref 3.87–5.11)
Retic Count, Absolute: 69.3 10*3/uL (ref 19.0–186.0)
Retic Ct Pct: 2.1 % (ref 0.4–3.1)
Reticulocyte Hemoglobin: 40.4 pg (ref >27.9)

## 2021-10-14 LAB — VITAMIN B12: Vitamin B-12: 206 pg/mL (ref 180–914)

## 2021-10-14 LAB — IRON AND TIBC
Iron: 38 ug/dL — ABNORMAL LOW (ref 41–142)
Saturation Ratios: 14 % — ABNORMAL LOW (ref 21–57)
TIBC: 271 ug/dL (ref 236–444)
UIBC: 234 ug/dL (ref 120–384)

## 2021-10-14 NOTE — Patient Instructions (Signed)
Thank you for choosing [facility] to provide your care.   Should you have questions after your visit to the [facility] ([facility]), please contact this office at [phone] between 8:30 AM and 4:30 PM.  Voice mails left after 4:00 PM may not be returned until the following business day.  Calls received after 4:30 PM will be answered by an off-site Nurse Triage Line.    Prescription Refills:  Please have your pharmacy contact us directly for most prescription requests.  Contact the office directly for refills of narcotics (pain medications). Allow 48-72 hours for refills.  Appointments: Please contact the [facility] scheduling department [phone] for questions regarding [facility] appointment scheduling.  Contact the schedulers with any scheduling changes so that your appointment can be rescheduled in a timely manner.   Central Scheduling for [facility] [phone] - Call to schedule procedures such as PET scans, CT scans, MRI, Ultrasound, etc.  To afford each patient quality time with our providers, please arrive 30 minutes before your scheduled appointment time.  If you arrive late for your appointment, you may be asked to reschedule.  We strive to give you quality time with our providers, and arriving late affects you and other patients whose appointments are after yours. If you are a no show for multiple scheduled visits, you may be dismissed from the clinic at the providers discretion.     Resources: [facility] Social Workers [phone] for additional information on assistance programs or assistance connecting with community support programs   [place] DSS  336-641-3447: Information regarding food stamps, Medicaid, and utility assistance [place] [employer] [place] 336-333-6589   [place] [employer]'s shared-ride transportation service for eligible riders who have a disability that prevents them from riding the fixed route bus.   Medicare Rights Center 800-333-4114  Helps people with Medicare understand their rights and benefits, navigate the Medicare system, and secure the quality healthcare they deserve [employer] 800-227-2345 Assists patients locate various types of support and financial assistance Cancer Care: 1-800-813-HOPE (4673) Provides financial assistance, online support groups, medication/co-pay assistance.   Transportation Assistance for appointments at [facility]: Transportation Coordinator [phone]  Again, thank you for choosing  Cancer Center for your care.       

## 2021-10-16 ENCOUNTER — Telehealth: Payer: Self-pay | Admitting: Physician Assistant

## 2021-10-16 ENCOUNTER — Telehealth: Payer: Self-pay | Admitting: *Deleted

## 2021-10-16 DIAGNOSIS — D649 Anemia, unspecified: Secondary | ICD-10-CM | POA: Insufficient documentation

## 2021-10-16 MED ORDER — VITAMIN B-12 1000 MCG PO TABS: 1000.0000 ug | ORAL_TABLET | Freq: Every day | ORAL | 3 refills | Status: AC

## 2021-10-16 NOTE — Telephone Encounter (Signed)
Contacted Lane Hacker, [facility] House Manager to provide medication information for patient (lives in group setting) at Ms. Thayil's direction.   Per Ms. Thayil - patient should continue oral iron, begin Vitamin B12 - prescription sent to pharmacy, and stop current birth control pills due to type of hormone in current BCP.  Ms. Karleen Hampshire verbalized understanding of all information. She asked if Ms.Thayil would be prescribing a new type of BCP. Advised her that it would be best for Rosalene to see whomever prescribed the BCP - either her PCP or her GYN.  Ms. Karleen Hampshire requested Ms. Thayil fax these instructions with signature to [phone] (fax at [facility] home) so that records can be updated and all staff can be informed.   Call information and message routed to Ms. Thayil.

## 2021-10-16 NOTE — Telephone Encounter (Signed)
I called patient's mother, Ms. [name], to review the labs from 10/14/2021.  CBC shows marked improvement of anemia with hemoglobin level 11.1.  Vitamin B12 levels were borderline deficient so recommend initiating vitamin B12 supplementation.  Prescription was sent to the pharmacy on file.  There is still iron deficiency anemia but since hemoglobin has improved, we recommend patient continue with oral iron supplementation at this time.   Patient will follow up with her PCP in a couple weeks for routine physical.  At that time, I advised discussed referral to gastroenterology to determine underlying cause for iron deficiency anemia.    Additionally, we will speak to patient's house manager, Lane Hacker (P: [phone]) to discuss our recommendations. This includes discontinuing oral contraceptives and initiating vitamin B12 supplementations.   I will see Ms. Mozingo back in clinic in 3 months with repeat labs. Patient's mother expressed understanding with the plan provided.

## 2021-10-17 LAB — METHYLMALONIC ACID, SERUM: Methylmalonic Acid, Quantitative: 136 nmol/L (ref 0–378)

## 2021-10-20 ENCOUNTER — Ambulatory Visit (HOSPITAL_COMMUNITY)
Admission: RE | Admit: 2021-10-20 | Discharge: 2021-10-20 | Disposition: A | Discharge status: Home / Self Care | Payer: Medicaid Other | Source: Ambulatory Visit | Attending: Surgery | Admitting: Surgery

## 2021-10-20 ENCOUNTER — Ambulatory Visit (INDEPENDENT_AMBULATORY_CARE_PROVIDER_SITE_OTHER): Payer: Medicaid Other | Admitting: Surgery

## 2021-10-20 ENCOUNTER — Other Ambulatory Visit: Payer: Self-pay

## 2021-10-20 ENCOUNTER — Encounter: Payer: Self-pay | Admitting: Surgery

## 2021-10-20 ENCOUNTER — Telehealth: Payer: Self-pay | Admitting: *Deleted

## 2021-10-20 VITALS — BP 119/64 | HR 73 | Temp 98.4°F | Resp 20 | Ht 59.0 in | Wt 178.0 lb

## 2021-10-20 DIAGNOSIS — I824Y2 Acute embolism and thrombosis of unspecified deep veins of left proximal lower extremity: Secondary | ICD-10-CM | POA: Diagnosis present

## 2021-10-20 NOTE — Progress Notes (Signed)
Vascular and Vein Specialist of [facility]  Patient name: Marissa Gray MRN: [id number] DOB: 10-Sep-1981 Sex: female   REASON FOR VISIT:    Follow up  HISOTRY OF PRESENT ILLNESS:    Marissa Gray is a 40 y.o. female who presented to the emergency department on 08/13/2021 with a left leg DVT.  She had recently undergone left hip replacement for osteoarthritis on 04/23/2021.  On 08/19/2021 she underwent mechanical thrombectomy and venoplasty of her common, external, femoral veins.  No May Thurner was visualized.  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  She has been evaluated by hematology who has recommended anticoagulation for 6 months prior to testing.  She states that her leg feels dramatically better.  The swelling has resolved.  She is wearing a makeshift compression sock.   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   Dermatitis    left leg    Down's syndrome    GERD (gastroesophageal reflux disease)    Hashimoto's disease    Heart murmur    Hypothyroidism    Pneumonia    hx of years ago    Rheumatoid arthritis (HCC)    Strabismus      FAMILY HISTORY:   Family History  Problem Relation Age of Onset   Heart disease Neg Hx     SOCIAL HISTORY:   Social History   Tobacco Use   Smoking status: Never   Smokeless tobacco: Never  Substance Use Topics   Alcohol use: No     ALLERGIES:   Allergies  Allergen Reactions   Cefaclor Hives and Rash    Other reaction(s): Unknown   Penicillins Hives     Has patient had a PCN reaction causing immediate rash, facial/tongue/throat swelling, SOB or lightheadedness with hypotension: Unknown Has patient had a PCN reaction causing severe rash involving  mucus membranes or skin necrosis: Unknown Has patient had a PCN reaction that required hospitalization: Unknown Has patient had a PCN reaction occurring within the last 10 years: Unknown If all of the above answers are "NO", then may proceed with Cephalosporin use. Other reaction(s): rash, Unknown   Sulfa Antibiotics Hives and Other (See Comments)    FEVER Other reaction(s): Unknown Other reaction(s): Unknown     CURRENT MEDICATIONS:   Current Outpatient Medications  Medication Sig Dispense Refill   acetaminophen (TYLENOL) 500 MG tablet Take 500 mg by mouth 3 (three) times daily as needed for moderate pain or fever.     albuterol (VENTOLIN HFA) 108 (90 Base) MCG/ACT inhaler Inhale 2 puffs into the lungs every 6 (six) hours as needed for wheezing or shortness of breath.     Ascorbic Acid (VITAMIN C) 500 MG CHEW Chew 1 tablet by mouth daily.     azithromycin (ZITHROMAX) 500 MG tablet Take 500 mg by mouth as directed. Take 1 tablet (500 mg) prior to Dental Procedure     calcium carbonate (OS-CAL) 600 MG TABS tablet Take 600 mg by mouth daily.     Cholecalciferol (VITAMIN D) 50 MCG (2000 UT) tablet Take 2,000 Units by mouth daily.     diclofenac Sodium (VOLTAREN) 1 % GEL Apply 2 g topically daily as needed (pain).     docusate sodium (COLACE) 100 MG capsule Take 100 mg by mouth 2 (  two) times daily.     Eyelid Cleansers (OCUSOFT EYELID CLEANSING) PADS Place 1 application into both eyes in the morning and at bedtime. Apply to Eyelid and eyelash topically two times a day for Dry eyes     Ferrous Sulfate (SLOW FE) 142 (45 Fe) MG TBCR Take 1 tablet by mouth daily.     fluticasone (FLOVENT HFA) 110 MCG/ACT inhaler Inhale 1 puff into the lungs 2 (two) times daily.     folic acid (FOLVITE) 1 MG tablet Take 3 mg by mouth in the morning. (0700)     ketotifen (ZADITOR) 0.025 % ophthalmic solution Place 1 drop into both eyes 2 (two) times daily.     levothyroxine (SYNTHROID) 75 MCG tablet Take 75 mcg by  mouth daily before breakfast.     loratadine (CLARITIN) 10 MG tablet Take 10 mg by mouth daily as needed for allergies.     methotrexate (RHEUMATREX) 2.5 MG tablet Take 7.5-10 mg by mouth as directed. Take 3 tablets (7.5 mg) on Thurs Morning & Take 4 tablets (10 mg) on Thurs Evening. Caution:Chemotherapy. Protect from light.     miconazole (MICOTIN) 2 % powder Apply 1 application topically in the morning and at bedtime.     mineral oil-hydrophilic petrolatum (AQUAPHOR) ointment Apply 1 application topically daily.     montelukast (SINGULAIR) 10 MG tablet Take 10 mg by mouth at bedtime.     Multiple Vitamins-Minerals (MULTIVITAMIN ADULTS PO) Take 1 tablet by mouth daily.     omeprazole (PRILOSEC) 40 MG capsule Take 40 mg by mouth daily.     potassium chloride (MICRO-K) 10 MEQ CR capsule Take 10 mEq by mouth 2 (two) times daily. (0700 & 2000)     pseudoephedrine (SUDAFED) 30 MG tablet Take 30 mg by mouth every 4 (four) hours as needed for congestion.     psyllium (REGULOID) 0.52 g capsule Take 0.52 g by mouth daily.     rivaroxaban (XARELTO) 10 MG TABS tablet Take 1 tablet (10 mg total) by mouth daily with breakfast. 360 tablet 0   sodium chloride (OCEAN) 0.65 % nasal spray Place 1 spray into the nose 2 (two) times daily. (0700 & 2000)     vitamin B-12 (CYANOCOBALAMIN) 1000 MCG tablet Take 1 tablet (1,000 mcg total) by mouth daily. 30 tablet 3   No current facility-administered medications for this visit.    REVIEW OF SYSTEMS:   [X]  denotes positive finding, [ ]  denotes negative finding Cardiac  Comments:  Chest pain or chest pressure:    Shortness of breath upon exertion:    Short of breath when lying flat:    Irregular heart rhythm:        Vascular    Pain in calf, thigh, or hip brought on by ambulation:    Pain in feet at night that wakes you up from your sleep:     Blood clot in your veins:    Leg swelling:         Pulmonary    Oxygen at home:    Productive cough:     Wheezing:          Neurologic    Sudden weakness in arms or legs:     Sudden numbness in arms or legs:     Sudden onset of difficulty speaking or slurred speech:    Temporary loss of vision in one eye:     Problems with dizziness:         Gastrointestinal  Blood in stool:     Vomited blood:         Genitourinary    Burning when urinating:     Blood in urine:        Psychiatric    Major depression:         Hematologic    Bleeding problems:    Problems with blood clotting too easily:        Skin    Rashes or ulcers:        Constitutional    Fever or chills:      PHYSICAL EXAM:   Vitals:   10/20/21 1019  BP: 119/64  Pulse: 73  Resp: 20  Temp: 98.4 F (36.9 C)  SpO2: 98%  Weight: 80.7 kg  Height: 4\' 11"  (1.499 m)    GENERAL: The patient is a well-nourished female, in no acute distress. The vital signs are documented above. CARDIAC: There is a regular rate and rhythm.  VASCULAR: No discernible edema to the left leg PULMONARY: Non-labored respirations MUSCULOSKELETAL: There are no major deformities or cyanosis. NEUROLOGIC: No focal weakness or paresthesias are detected. SKIN: There are no ulcers or rashes noted. PSYCHIATRIC: The patient has a normal affect.  STUDIES:   I have reviewed her ultrasound with the following findings: The left CFV and external iliac vein are not compressible with No Doppler  signal suggestive of DVT. The left CIV and proximal to mid EIV not  visualized.   MEDICAL ISSUES:   Left leg DVT: This appears to be provoked, following orthopedic surgery.  She underwent mechanical thrombectomy.  No stent was placed.  She has had a excellent clinical result.  However, ultrasound today suggested residual thrombus in the external iliac and femoral veins, however this was a very difficult exam.  Fortunately, clinically she is doing very well.  No changes have been recommended.  I Minna see her back in 6 months with a repeat venous evaluation.    , MD, FACS Vascular and Vein Specialists of [facility] [phone] Pager [phone]

## 2021-10-20 NOTE — Telephone Encounter (Signed)
Note faxed to Umar as requested updating medication instructions

## 2021-10-24 ENCOUNTER — Other Ambulatory Visit: Payer: Self-pay

## 2021-10-24 DIAGNOSIS — I824Y2 Acute embolism and thrombosis of unspecified deep veins of left proximal lower extremity: Secondary | ICD-10-CM

## 2022-01-12 ENCOUNTER — Other Ambulatory Visit: Payer: Self-pay | Admitting: Physician Assistant

## 2022-01-12 DIAGNOSIS — D649 Anemia, unspecified: Secondary | ICD-10-CM

## 2022-01-12 DIAGNOSIS — I824Y2 Acute embolism and thrombosis of unspecified deep veins of left proximal lower extremity: Secondary | ICD-10-CM

## 2022-01-13 ENCOUNTER — Inpatient Hospital Stay: Payer: Medicaid Other

## 2022-01-13 ENCOUNTER — Inpatient Hospital Stay: Payer: Medicaid Other | Admitting: Physician Assistant

## 2022-02-03 ENCOUNTER — Other Ambulatory Visit: Payer: Self-pay

## 2022-02-03 ENCOUNTER — Inpatient Hospital Stay: Payer: Medicaid Other | Attending: Physician Assistant

## 2022-02-03 ENCOUNTER — Inpatient Hospital Stay (HOSPITAL_BASED_OUTPATIENT_CLINIC_OR_DEPARTMENT_OTHER): Payer: Medicaid Other | Admitting: Physician Assistant

## 2022-02-03 VITALS — BP 130/70 | HR 68 | Temp 97.3°F | Resp 18 | Wt 169.3 lb

## 2022-02-03 DIAGNOSIS — M069 Rheumatoid arthritis, unspecified: Secondary | ICD-10-CM | POA: Insufficient documentation

## 2022-02-03 DIAGNOSIS — D509 Iron deficiency anemia, unspecified: Secondary | ICD-10-CM | POA: Insufficient documentation

## 2022-02-03 DIAGNOSIS — I82502 Chronic embolism and thrombosis of unspecified deep veins of left lower extremity: Secondary | ICD-10-CM | POA: Insufficient documentation

## 2022-02-03 DIAGNOSIS — Z7901 Long term (current) use of anticoagulants: Secondary | ICD-10-CM | POA: Insufficient documentation

## 2022-02-03 DIAGNOSIS — I825Y2 Chronic embolism and thrombosis of unspecified deep veins of left proximal lower extremity: Secondary | ICD-10-CM | POA: Diagnosis not present

## 2022-02-03 DIAGNOSIS — D649 Anemia, unspecified: Secondary | ICD-10-CM | POA: Diagnosis not present

## 2022-02-03 DIAGNOSIS — E538 Deficiency of other specified B group vitamins: Secondary | ICD-10-CM | POA: Diagnosis not present

## 2022-02-03 LAB — CBC WITH DIFFERENTIAL (CANCER CENTER ONLY)
Abs Immature Granulocytes: 0.01 10*3/uL (ref 0.00–0.07)
Basophils Absolute: 0 10*3/uL (ref 0.0–0.1)
Basophils Relative: 1 %
Eosinophils Absolute: 0 10*3/uL (ref 0.0–0.5)
Eosinophils Relative: 1 %
HCT: 38.6 % (ref 36.0–46.0)
Hemoglobin: 13.2 g/dL (ref 12.0–15.0)
Immature Granulocytes: 0 %
Lymphocytes Relative: 33 %
Lymphs Abs: 1.5 10*3/uL (ref 0.7–4.0)
MCH: 37.5 pg — ABNORMAL HIGH (ref 26.0–34.0)
MCHC: 34.2 g/dL (ref 30.0–36.0)
MCV: 109.7 fL — ABNORMAL HIGH (ref 80.0–100.0)
Monocytes Absolute: 0.3 10*3/uL (ref 0.1–1.0)
Monocytes Relative: 7 %
Neutro Abs: 2.8 10*3/uL (ref 1.7–7.7)
Neutrophils Relative %: 58 %
Platelet Count: 334 10*3/uL (ref 150–400)
RBC: 3.52 MIL/uL — ABNORMAL LOW (ref 3.87–5.11)
RDW: 14.7 % (ref 11.5–15.5)
WBC Count: 4.7 10*3/uL (ref 4.0–10.5)
nRBC: 0 % (ref 0.0–0.2)

## 2022-02-03 LAB — IRON AND IRON BINDING CAPACITY (CC-WL,HP ONLY)
Iron: 76 ug/dL (ref 28–170)
Saturation Ratios: 29 % (ref 10.4–31.8)
TIBC: 263 ug/dL (ref 250–450)
UIBC: 187 ug/dL (ref 148–442)

## 2022-02-03 LAB — VITAMIN B12: Vitamin B-12: 1419 pg/mL — ABNORMAL HIGH (ref 180–914)

## 2022-02-03 LAB — FERRITIN: Ferritin: 267 ng/mL (ref 11–307)

## 2022-02-03 NOTE — Progress Notes (Signed)
[facility] [phone]   [phone]  PROGRESS NOTE  Patient Care Team: Merri BrunetteSmith, Candace, MD as PCP - General (Family Medicine)  Hematological/Oncological History 1) 08/13/2021-08/22/2021: Admitted for acute left lower extremity DVT and acute superficial femoral vein thrombophlebitis.  -Doppler US of lower extremity confirmed  acute DVT involving the left common femoral vein, left femoral vein, and left popliteal vein. Additionally, acute superficial vein thrombosis involving the left great saphenous vein.  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  Mechanical thrombectomy and balloon venoplasty of the ileal femoral venous segments were done removing almost entirely the chronic thrombus on 08/19/2021 -Xarelto was started on 08/20/2021.  -Found to have anemia secondary to iron deficiency s/p IV iron infusion x 1 dose. Vitamin B12 level low.   2) 10[facility]14/2021[facility]   CHIEF COMPLAINTS/PURPOSE OF CONSULTATION:  -Left leg DVT -Vitamin B12 deficiency -Iron deficiency  HISTORY OF PRESENTING ILLNESS:  Marissa Gray 41 y.o. female returns for a follow up for h/o DVT of the left leg, vitamin B12 deficiency and iron deficiency. She is accompanied by her mother for this visit.    On exam today, Ms. Lovell SheehanJenkins reports that she is doing well. She has good energy levels and trying to be more active. She continues to reside in a group home. She is eating more a balanced diet and has lost some weight. She denies any nausea, vomiting or abdominal pain.  Her bowel movements are unchanged.  She denies easy bruising or active bleeding. Patient denies any fevers, chills, night sweats, shortness of breath, chest pain or cough.  She has no other complaints.  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   Dermatitis    left leg    Down's syndrome    GERD (gastroesophageal reflux disease)    Hashimoto's disease    Heart murmur    Hypothyroidism    Pneumonia    hx of years ago    Rheumatoid arthritis (HCC)    Strabismus     SURGICAL HISTORY: Past Surgical History:  Procedure Laterality Date   CARDIAC SURGERY     ASD and vSD repair    FOOT ARTHRODESIS, TRIPLE     bilateral    I & D EXTREMITY Left 05/19/2021   Procedure: IRRIGATION AND DEBRIDEMENT LEFT HIP WITH POSSIBLE HEAD AND LINER EXCHANGE;  Surgeon: Samson FredericSwinteck, Brian, MD;  Location: [facility];  Service: Orthopedics;  Laterality: Left;   MECHANICAL THROMBECTOMY WITH AORTOGRAM AND INTERVENTION Left 08/19/2021   Procedure: LEFT LOWER EXTREMITY DEEP VEIN THROMBOSIS MECHANICAL THROMBECTOMY WITH INARI DEVICE;  Surgeon: Nada LibmanBrabham, Vance W, MD;  Location: [facility] OR;  Service: Vascular;  Laterality: Left;   ORIF TIBIA PLATEAU Right 06/22/2017   Procedure: OPEN REDUCTION INTERNAL FIXATION (ORIF) TIBIAL PLATEAU;  Surgeon: Myrene GalasHandy, Michael, MD;  Location: [facility] OR;  Service: Orthopedics;  Laterality: Right;   STRABISMUS SURGERY     TOTAL HIP ARTHROPLASTY Left 04/23/2021   Procedure: TOTAL HIP ARTHROPLASTY-Posterior;  Surgeon: Ollen GrossAluisio, Frank, MD;  Location: [facility];  Service: Orthopedics;  Laterality: Left;  100min   ulcer surgery      ULTRASOUND GUIDANCE FOR VASCULAR ACCESS Left 08/19/2021   Procedure: ULTRASOUND GUIDANCE FOR  VASCULAR ACCESS;  Surgeon: Serafina Mitchell, MD;  Location: [facility] OR;  Service: Vascular;  Laterality: Left;    SOCIAL HISTORY: Social History   Socioeconomic History   Marital status: Single    Spouse name: Not on file   Number of children: Not on file   Years of education: Not on file   Highest education level: Not on file  Occupational History   Not on file  Tobacco Use    Smoking status: Never   Smokeless tobacco: Never  Vaping Use   Vaping Use: Never used  Substance and Sexual Activity   Alcohol use: No   Drug use: No   Sexual activity: Not on file  Other Topics Concern   Not on file  Social History Narrative   Not on file   Social Determinants of Health   Financial Resource Strain: Not on file  Food Insecurity: Not on file  Transportation Needs: Not on file  Physical Activity: Not on file  Stress: Not on file  Social Connections: Not on file  Intimate Partner Violence: Not on file    FAMILY HISTORY: Family History  Problem Relation Age of Onset   Heart disease Neg Hx     ALLERGIES:  is allergic to cefaclor, penicillins, and sulfa antibiotics.  MEDICATIONS:  Current Outpatient Medications  Medication Sig Dispense Refill   acetaminophen (TYLENOL) 500 MG tablet Take 500 mg by mouth 3 (three) times daily as needed for moderate pain or fever.     albuterol (VENTOLIN HFA) 108 (90 Base) MCG/ACT inhaler Inhale 2 puffs into the lungs every 6 (six) hours as needed for wheezing or shortness of breath.     Ascorbic Acid (VITAMIN C) 500 MG CHEW Chew 1 tablet by mouth daily.     azithromycin (ZITHROMAX) 500 MG tablet Take 500 mg by mouth as directed. Take 1 tablet (500 mg) prior to Dental Procedure     calcium carbonate (OS-CAL) 600 MG TABS tablet Take 600 mg by mouth daily.     Cholecalciferol (VITAMIN D) 50 MCG (2000 UT) tablet Take 2,000 Units by mouth daily.     diclofenac Sodium (VOLTAREN) 1 % GEL Apply 2 g topically daily as needed (pain).     docusate sodium (COLACE) 100 MG capsule Take 100 mg by mouth 2 (two) times daily.     Eyelid Cleansers (OCUSOFT EYELID CLEANSING) PADS Place 1 application into both eyes in the morning and at bedtime. Apply to Eyelid and eyelash topically two times a day for Dry eyes     Ferrous Sulfate (SLOW FE) 142 (45 Fe) MG TBCR Take 1 tablet by mouth daily.     fluticasone (FLOVENT HFA) 110 MCG/ACT inhaler Inhale 1  puff into the lungs 2 (two) times daily.     folic acid (FOLVITE) 1 MG tablet Take 3 mg by mouth in the morning. (0700)     ketotifen (ZADITOR) 0.025 % ophthalmic solution Place 1 drop into both eyes 2 (two) times daily.     levothyroxine (SYNTHROID) 75 MCG tablet Take 75 mcg by mouth daily before breakfast.     loratadine (CLARITIN) 10 MG tablet Take 10 mg by mouth daily as needed for allergies.     methotrexate (RHEUMATREX) 2.5 MG tablet Take 7.5-10 mg by mouth as directed. Take 3 tablets (7.5 mg) on Thurs Morning & Take 4 tablets (10 mg) on Thurs Evening. Caution:Chemotherapy. Protect from light.     miconazole (MICOTIN) 2 % powder Apply 1 application topically in the  morning and at bedtime.     mineral oil-hydrophilic petrolatum (AQUAPHOR) ointment Apply 1 application topically daily.     montelukast (SINGULAIR) 10 MG tablet Take 10 mg by mouth at bedtime.     Multiple Vitamins-Minerals (MULTIVITAMIN ADULTS PO) Take 1 tablet by mouth daily.     omeprazole (PRILOSEC) 40 MG capsule Take 40 mg by mouth daily.     potassium chloride (MICRO-K) 10 MEQ CR capsule Take 10 mEq by mouth 2 (two) times daily. (0700 & 2000)     pseudoephedrine (SUDAFED) 30 MG tablet Take 30 mg by mouth every 4 (four) hours as needed for congestion.     psyllium (REGULOID) 0.52 g capsule Take 0.52 g by mouth daily.     rivaroxaban (XARELTO) 10 MG TABS tablet Take 1 tablet (10 mg total) by mouth daily with breakfast. 360 tablet 0   sodium chloride (OCEAN) 0.65 % nasal spray Place 1 spray into the nose 2 (two) times daily. (0700 & 2000)     vitamin B-12 (CYANOCOBALAMIN) 1000 MCG tablet Take 1 tablet (1,000 mcg total) by mouth daily. 30 tablet 3   No current facility-administered medications for this visit.    REVIEW OF SYSTEMS:   Constitutional: ( - ) fevers, ( - )  chills , ( - ) night sweats Eyes: ( - ) blurriness of vision, ( - ) double vision, ( - ) watery eyes Ears, nose, mouth, throat, and face: ( - ) mucositis, (  - ) sore throat Respiratory: ( - ) cough, ( - ) dyspnea, ( - ) wheezes Cardiovascular: ( - ) palpitation, ( - ) chest discomfort, ( -) lower extremity swelling Gastrointestinal:  ( - ) nausea, ( - ) heartburn, ( - ) change in bowel habits Skin: ( - ) abnormal skin rashes Lymphatics: ( - ) new lymphadenopathy, ( - ) easy bruising Neurological: ( - ) numbness, ( - ) tingling, ( - ) new weaknesses Behavioral/Psych: ( - ) mood change, ( - ) new changes  All other systems were reviewed with the patient and are negative.  PHYSICAL EXAMINATION: ECOG PERFORMANCE STATUS: 0 - Asymptomatic  Vitals:   02/03/22 1128  BP: 130/70  Pulse: 68  Resp: 18  Temp: (!) 97.3 F (36.3 C)  SpO2: 100%   Filed Weights   02/03/22 1128  Weight: 169 lb 4.8 oz (76.8 kg)    GENERAL: well appearing female in NAD  SKIN: skin color, texture, turgor are normal, no rashes or significant lesions EYES: conjunctiva are pink and non-injected, sclera clear OROPHARYNX: no exudate, no erythema; lips, buccal mucosa, and tongue normal  LUNGS: clear to auscultation and percussion with normal breathing effort HEART: regular rate & rhythm and no murmurs and no lower extremity edema ABDOMEN: soft, non-tender, non-distended, normal bowel sounds Musculoskeletal: no cyanosis of digits and no clubbing  PSYCH: alert & oriented x 3, fluent speech   LABORATORY DATA:  I have reviewed the data as listed CBC Latest Ref Rng & Units 02/03/2022 10/14/2021 08/22/2021  WBC 4.0 - 10.5 K/uL 4.7 4.9 4.5  Hemoglobin 12.0 - 15.0 g/dL 13.2 11.1(L) 7.4(L)  Hematocrit 36.0 - 46.0 % 38.6 34.4(L) 25.2(L)  Platelets 150 - 400 K/uL 334 295 260    CMP Latest Ref Rng & Units 10/14/2021 08/22/2021 08/20/2021  Glucose 70 - 99 mg/dL 83 - -  BUN 6 - 20 mg/dL 9 - -  Creatinine 0.44 - 1.00 mg/dL 0.76 0.82 0.72  Sodium 135 - 145 mmol/L 140 - -  Potassium 3.5 - 5.1 mmol/L 4.3 - -  Chloride 98 - 111 mmol/L 109 - -  CO2 22 - 32 mmol/L 20(L) - -  Calcium  8.9 - 10.3 mg/dL 8.7(L) - -  Total Protein 6.5 - 8.1 g/dL 8.0 - -  Total Bilirubin 0.3 - 1.2 mg/dL 0.3 - -  Alkaline Phos 38 - 126 U/L 109 - -  AST 15 - 41 U/L 20 - -  ALT 0 - 44 U/L 22 - -    ASSESSMENT & PLAN NISHI BASSETTE is a 41 y.o. female who presents to the clinic for evaluation for history of left lower leg DVT currently on Xarelto.  We reviewed provoking factors for venous thromboembolisms including recent surgery, infectious process, estrogen based hormone therapy, prolonged immobility, recent travel and smoking.  I discussed with the patient and her mother that the likely cause of her left lower leg DVT was multifocal including recent left hip replacement that was complicated by cellulitis requiring I&D.  Additionally patient was on estrogen-based birth control when she developed the DVT.  For these reasons, we recommend anticoagulation for total of 6 months.   #Left Leg DVT: --Felt to be provoked by left hip arthroplasty in April [id number] complicated by serous wound drainage status post I&D and surgical exploration in May 2022. Additionally, patient was on oral contraceptives when she was diagnosed with DVT. --Patient discontinued estrogen OCPs and switch to progesterone based hormone therapy.  --Currently on Xarelto 20 mg once daily with good tolerance. Recommended to continue for a total of six months. --Patient plans to follow up with Dr. Trula Slade from vascular surgery to do repeat venous evaluation around April 2023. She will discuss discontinuing Xarelto afterwards.   #Normocytc Anemia: --Likely multifocal due to RA currently on methrotrexate, iron deficiency anemia and possibly vitamin B12 deficiency.  --S/p IV venofer x 1 on 08/17/2021.  --Labs today show no evidence of iron deficiency anemia or vitamin B12 deficiency. --Currently on Slow Fe (45 mg of elemental iron) once daily. Recommend to continue.  --Currently on vitamin B12 supplementation 1000 mcg once daily. Recommend to  continue/   Follow up: --RTC in 6 months with repeat labs.   No orders of the defined types were placed in this encounter.   All questions were answered. The patient knows to call the clinic with any problems, questions or concerns.  I have spent a total of 25 minutes minutes of face-to-face and non-face-to-face time, preparing to see the patient,  performing a medically appropriate examination, counseling and educating the patient, documenting clinical information in the electronic health record, and care coordination.   Dede Query, PA-C Department of Hematology/Oncology [place] [facility] Phone: [phone]

## 2022-02-04 ENCOUNTER — Telehealth: Payer: Self-pay

## 2022-02-04 NOTE — Telephone Encounter (Signed)
Pt's mother, Elease Hashimotoatricia, advised with VU

## 2022-02-04 NOTE — Telephone Encounter (Signed)
-----   Message from Briant Cedar, PA-C sent at 02/04/2022  2:21 PM EST ----- Please notify patient's mom that there is no evidence of iron or B12 deficiency. Okay to continue oral iron and B12 supplements. We will see patient back in 6 months.

## 2022-02-05 ENCOUNTER — Telehealth: Payer: Self-pay

## 2022-02-05 NOTE — Telephone Encounter (Signed)
Per Marissa KaufmannIrene Gray, PA_C:   Can you call this patient's group home manager Marissa Gray(Marissa Gray) at [phone] and give our recommendations from our last visit.  Ms Marissa HampshireSpencer advised and verbalized understanding that pt will continue oral iron and vitamin B12.  She has an appt in April with Dr Marissa GianottiBrabham and they will discuss Xarelto at that time.  She is to RTC in 6 mo.

## 2022-04-21 IMAGING — RF DG FLUORO GUIDE NDL PLC/BX
1 series · 3 of 3 positions shown · non-contrast
Comparison: Current left hip radiographs.

CLINICAL DATA: Patient with drainage from the lateral left hip.
Clinical concern for infected left hip joint prosthesis.

EXAM:
LEFT HIP INJECTION UNDER FLUOROSCOPY

[Series 3: cp_standard · 0.34mm/px · 3 of 17 frames shown]
[frame 3/17]
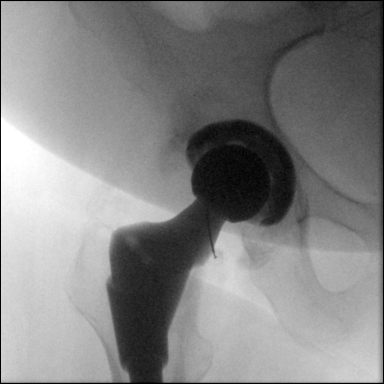
[frame 9/17]
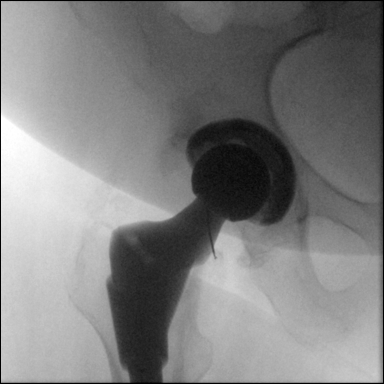
[frame 15/17]
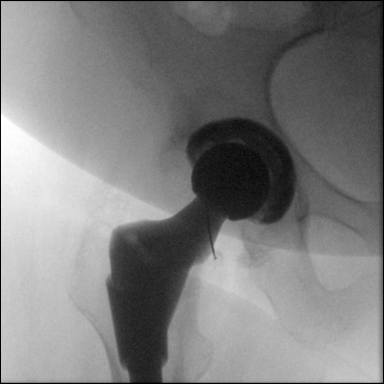

[3 of 3 positions shown; findings below may reference images not displayed]

FLUOROSCOPY TIME:  Fluoroscopy Time:  0 minutes and 48 seconds.

Radiation Exposure Index (if provided by the fluoroscopic device):
12.3 mGy

Number of Acquired Spot Images: 1

PROCEDURE:
Overlying skin prepped with Betadine, draped in the usual sterile
fashion, and infiltrated locally with buffered Lidocaine. A 20 gauge
spinal needle advanced to the junction of the left femoral
prosthesis head and neck. 1 ml of Lidocaine injected easily. The hip
was then aspirated with a total of 2 mL of bloody fluid acquired. No
additional fluid could be aspirated.

Fluid was sent for laboratory analysis.
IMPRESSION: Fluoroscopic guided aspiration of the left hip without complication.
2 mL of bloody fluid acquired.

## 2022-04-27 ENCOUNTER — Encounter: Payer: Self-pay | Admitting: Surgery

## 2022-04-27 ENCOUNTER — Ambulatory Visit (HOSPITAL_COMMUNITY)
Admission: RE | Admit: 2022-04-27 | Discharge: 2022-04-27 | Disposition: A | Discharge status: Home / Self Care | Payer: Medicaid Other | Source: Ambulatory Visit | Attending: Surgery | Admitting: Surgery

## 2022-04-27 ENCOUNTER — Ambulatory Visit (INDEPENDENT_AMBULATORY_CARE_PROVIDER_SITE_OTHER): Payer: Medicaid Other | Admitting: Surgery

## 2022-04-27 VITALS — BP 99/62 | HR 70 | Temp 98.2°F | Resp 20 | Ht 59.0 in | Wt 163.0 lb

## 2022-04-27 DIAGNOSIS — I824Y2 Acute embolism and thrombosis of unspecified deep veins of left proximal lower extremity: Secondary | ICD-10-CM | POA: Diagnosis not present

## 2022-04-27 NOTE — Progress Notes (Signed)
? ?[facility] [place] ? ?Patient name: Marissa Gray MRN: [id number] DOB: 17-Feb-1981 Sex: female ? ? ?REASON FOR VISIT:  ? ? ?Follow up ? ?HISOTRY OF PRESENT ILLNESS:  ? ?Marissa Gray is a 41 y.o. female who presented to the emergency department on 08/13/2021 with a left leg DVT.  She had recently undergone left hip replacement for osteoarthritis on 04/23/2021.  Her DVT was felt to be provoked, both from her surgery and from taking estrogen OCPs, which were discontinued and she was switched to progesterone..  On 08/19/2021 she underwent mechanical thrombectomy and venoplasty of her common, external, femoral veins.  No May Thurner was visualized.  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  Her follow-up ultrasound revealed residual thrombus within the external iliac and femoral veins.  Because she was doing so well clinically, no changes were recommended.  She has been seen by hematology who recommended 6 months of anticoagulation. ?  ?She no longer has leg swelling.  She has been switched from an estrogen-based to progesterone-based OCP ? ? ?PAST MEDICAL HISTORY:  ? ?Past Medical History:  ?Diagnosis Date  ? Asthma   ? 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 Closed bicondylar fracture of right tibial plateau 06/20/2017  ? 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 Dermatitis   ? left leg   ? Down's syndrome   ? GERD (gastroesophageal reflux disease)   ? Hashimoto's disease   ? Heart murmur   ? Hypothyroidism   ? Pneumonia   ? hx of years ago   ? Rheumatoid arthritis ([place])   ? Strabismus   ? ? ? ?FAMILY HISTORY:  ? ?Family History  ?Problem Relation Age of Onset  ? Heart disease Neg Hx   ? ? ?SOCIAL HISTORY:  ? ?Social History  ? ?Tobacco Use  ? Smoking status: Never  ? Smokeless tobacco: Never  ?Substance Use Topics  ? Alcohol use: No  ? ? ? ?ALLERGIES:  ? ?Allergies  ?Allergen Reactions  ? Cefaclor  Hives and Rash  ?  Other reaction(s): Unknown  ? Penicillins Hives  ?   ?Has patient had a PCN reaction causing immediate rash, facial/tongue/throat swelling, SOB or lightheadedness with hypotension: Unknown ?Has patient had a PCN reaction causing severe rash involving mucus membranes or skin necrosis: Unknown ?Has patient had a PCN reaction that required hospitalization: Unknown ?Has patient had a PCN reaction occurring within the last 10 years: Unknown ?If all of the above answers are "NO", then may proceed with Cephalosporin use. ?Other reaction(s): rash, Unknown  ? Sulfa Antibiotics Hives and Other (See Comments)  ?  FEVER ?Other reaction(s): Unknown ?Other reaction(s): Unknown  ? ? ? ?CURRENT MEDICATIONS:  ? ?Current Outpatient Medications  ?Medication Sig Dispense Refill  ? acetaminophen (TYLENOL) 500 MG tablet Take 500 mg by mouth 3 (three) times daily as needed for moderate pain or fever.    ? albuterol (VENTOLIN HFA) 108 (90 Base) MCG/ACT inhaler Inhale 2 puffs into the lungs every 6 (six) hours as needed for wheezing or shortness of breath.    ? Ascorbic Acid (VITAMIN C) 500 MG CHEW Chew 1 tablet by mouth daily.    ? azithromycin (ZITHROMAX) 500 MG tablet Take 500 mg by mouth as directed. Take 1 tablet (500 mg) prior to Dental Procedure    ? calcium carbonate (OS-CAL) 600 MG TABS tablet Take 600 mg by mouth daily.    ? Cholecalciferol (VITAMIN D) 50  MCG (2000 UT) tablet Take 2,000 Units by mouth daily.    ? diclofenac Sodium (VOLTAREN) 1 % GEL Apply 2 g topically daily as needed (pain).    ? docusate sodium (COLACE) 100 MG capsule Take 100 mg by mouth 2 (two) times daily.    ? Eyelid Cleansers (OCUSOFT EYELID CLEANSING) PADS Place 1 application into both eyes in the morning and at bedtime. Apply to Eyelid and eyelash topically two times a day for Dry eyes    ? Ferrous Sulfate (SLOW FE) 142 (45 Fe) MG TBCR Take 1 tablet by mouth daily.    ? fluticasone (FLOVENT HFA) 110 MCG/ACT inhaler Inhale 1 puff into  the lungs 2 (two) times daily.    ? folic acid (FOLVITE) 1 MG tablet Take 3 mg by mouth in the morning. (0700)    ? ketotifen (ZADITOR) 0.025 % ophthalmic solution Place 1 drop into both eyes 2 (two) times daily.    ? levothyroxine (SYNTHROID) 75 MCG tablet Take 75 mcg by mouth daily before breakfast.    ? loratadine (CLARITIN) 10 MG tablet Take 10 mg by mouth daily as needed for allergies.    ? methotrexate (RHEUMATREX) 2.5 MG tablet Take 7.5-10 mg by mouth as directed. Take 3 tablets (7.5 mg) on Thurs Morning & Take 4 tablets (10 mg) on Thurs Evening. Caution:Chemotherapy. Protect from light.    ? miconazole (MICOTIN) 2 % powder Apply 1 application topically in the morning and at bedtime.    ? mineral oil-hydrophilic petrolatum (AQUAPHOR) ointment Apply 1 application topically daily.    ? montelukast (SINGULAIR) 10 MG tablet Take 10 mg by mouth at bedtime.    ? Multiple Vitamins-Minerals (MULTIVITAMIN ADULTS PO) Take 1 tablet by mouth daily.    ? omeprazole (PRILOSEC) 40 MG capsule Take 40 mg by mouth daily.    ? potassium chloride (MICRO-K) 10 MEQ CR capsule Take 10 mEq by mouth 2 (two) times daily. (0700 & 2000)    ? pseudoephedrine (SUDAFED) 30 MG tablet Take 30 mg by mouth every 4 (four) hours as needed for congestion.    ? psyllium (REGULOID) 0.52 g capsule Take 0.52 g by mouth daily.    ? rivaroxaban (XARELTO) 10 MG TABS tablet Take 1 tablet (10 mg total) by mouth daily with breakfast. 360 tablet 0  ? sodium chloride (OCEAN) 0.65 % nasal spray Place 1 spray into the nose 2 (two) times daily. (0700 & 2000)    ? vitamin B-12 (CYANOCOBALAMIN) 1000 MCG tablet Take 1 tablet (1,000 mcg total) by mouth daily. 30 tablet 3  ? ?No current facility-administered medications for this visit.  ? ? ?REVIEW OF SYSTEMS:  ? ?[X]  denotes positive finding, [ ]  denotes negative finding ?Cardiac  Comments:  ?Chest pain or chest pressure:    ?Shortness of breath upon exertion:    ?Short of breath when lying flat:    ?Irregular  heart rhythm:    ?    ?Vascular    ?Pain in calf, thigh, or hip brought on by ambulation:    ?Pain in feet at night that wakes you up from your sleep:     ?Blood clot in your veins:    ?Leg swelling:     ?    ?Pulmonary    ?Oxygen at home:    ?Productive cough:     ?Wheezing:     ?    ?Neurologic    ?Sudden weakness in arms or legs:     ?Sudden numbness in  arms or legs:     ?Sudden onset of difficulty speaking or slurred speech:    ?Temporary loss of vision in one eye:     ?Problems with dizziness:     ?    ?Gastrointestinal    ?Blood in stool:     ?Vomited blood:     ?    ?Genitourinary    ?Burning when urinating:     ?Blood in urine:    ?    ?Psychiatric    ?Major depression:     ?    ?Hematologic    ?Bleeding problems:    ?Problems with blood clotting too easily:    ?    ?Skin    ?Rashes or ulcers:    ?    ?Constitutional    ?Fever or chills:    ? ? ?PHYSICAL EXAM:  ? ?Vitals:  ? 04/27/22 0957  ?BP: 99/62  ?Pulse: 70  ?Resp: 20  ?Temp: 98.2 ?F (36.8 ?C)  ?SpO2: 98%  ?Weight: 163 lb (73.9 kg)  ?Height: 4\' 11"  (1.499 m)  ? ? ?GENERAL: The patient is a well-nourished female, in no acute distress. The vital signs are documented above. ?CARDIAC: There is a regular rate and rhythm.  ?VASCULAR: No significant leg swelling ?PULMONARY: Non-labored respirations ?MUSCULOSKELETAL: There are no major deformities or cyanosis. ?NEUROLOGIC: No focal weakness or paresthesias are detected. ?SKIN: There are no ulcers or rashes noted. ?PSYCHIATRIC: The patient has a normal affect. ? ?STUDIES:  ? ?I have reviewed the following studies: ?IVC/Iliac: There is no evidence of thrombus involving the left common  ?iliac vein. There is no evidence of thrombus involving the left external  ?iliac vein. Visualization of proximal Inferior Vena Cava and proximal  ?common Iliac was limited. Chronic  ?thrombus observed involving the left common femoral vein. ? ?MEDICAL ISSUES:  ? 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  She has completed   > 6 months of anticoagulation and therefore I am discontinuing it.  She does not have any leg swelling, and so she does not need compression socks.  S

## 2022-08-04 ENCOUNTER — Other Ambulatory Visit: Payer: Self-pay | Admitting: Hematology and Oncology

## 2022-08-04 DIAGNOSIS — I824Y2 Acute embolism and thrombosis of unspecified deep veins of left proximal lower extremity: Secondary | ICD-10-CM

## 2022-08-04 DIAGNOSIS — D649 Anemia, unspecified: Secondary | ICD-10-CM

## 2022-08-05 ENCOUNTER — Other Ambulatory Visit: Payer: Self-pay

## 2022-08-05 ENCOUNTER — Inpatient Hospital Stay: Payer: Medicaid Other | Attending: Hematology and Oncology

## 2022-08-05 ENCOUNTER — Inpatient Hospital Stay (HOSPITAL_BASED_OUTPATIENT_CLINIC_OR_DEPARTMENT_OTHER): Payer: Medicaid Other | Admitting: Hematology and Oncology

## 2022-08-05 VITALS — BP 90/72 | HR 79 | Resp 18 | Ht 59.0 in | Wt 162.8 lb

## 2022-08-05 DIAGNOSIS — D649 Anemia, unspecified: Secondary | ICD-10-CM | POA: Insufficient documentation

## 2022-08-05 DIAGNOSIS — E538 Deficiency of other specified B group vitamins: Secondary | ICD-10-CM | POA: Insufficient documentation

## 2022-08-05 DIAGNOSIS — I824Y2 Acute embolism and thrombosis of unspecified deep veins of left proximal lower extremity: Secondary | ICD-10-CM

## 2022-08-05 DIAGNOSIS — I82502 Chronic embolism and thrombosis of unspecified deep veins of left lower extremity: Secondary | ICD-10-CM | POA: Diagnosis not present

## 2022-08-05 LAB — IRON AND IRON BINDING CAPACITY (CC-WL,HP ONLY)
Iron: 79 ug/dL (ref 28–170)
Saturation Ratios: 31 % (ref 10.4–31.8)
TIBC: 255 ug/dL (ref 250–450)
UIBC: 176 ug/dL (ref 148–442)

## 2022-08-05 LAB — CBC WITH DIFFERENTIAL (CANCER CENTER ONLY)
Abs Immature Granulocytes: 0.01 10*3/uL (ref 0.00–0.07)
Basophils Absolute: 0 10*3/uL (ref 0.0–0.1)
Basophils Relative: 1 %
Eosinophils Absolute: 0 10*3/uL (ref 0.0–0.5)
Eosinophils Relative: 1 %
HCT: 38.9 % (ref 36.0–46.0)
Hemoglobin: 13.6 g/dL (ref 12.0–15.0)
Immature Granulocytes: 0 %
Lymphocytes Relative: 38 %
Lymphs Abs: 2 10*3/uL (ref 0.7–4.0)
MCH: 38 pg — ABNORMAL HIGH (ref 26.0–34.0)
MCHC: 35 g/dL (ref 30.0–36.0)
MCV: 108.7 fL — ABNORMAL HIGH (ref 80.0–100.0)
Monocytes Absolute: 0.5 10*3/uL (ref 0.1–1.0)
Monocytes Relative: 9 %
Neutro Abs: 2.7 10*3/uL (ref 1.7–7.7)
Neutrophils Relative %: 51 %
Platelet Count: 297 10*3/uL (ref 150–400)
RBC: 3.58 MIL/uL — ABNORMAL LOW (ref 3.87–5.11)
RDW: 13.4 % (ref 11.5–15.5)
WBC Count: 5.2 10*3/uL (ref 4.0–10.5)
nRBC: 0 % (ref 0.0–0.2)

## 2022-08-05 LAB — CMP (CANCER CENTER ONLY)
ALT: 11 U/L (ref 0–44)
AST: 19 U/L (ref 15–41)
Albumin: 4.2 g/dL (ref 3.5–5.0)
Alkaline Phosphatase: 73 U/L (ref 38–126)
Anion gap: 7 (ref 5–15)
BUN: 17 mg/dL (ref 6–20)
CO2: 26 mmol/L (ref 22–32)
Calcium: 9.1 mg/dL (ref 8.9–10.3)
Chloride: 107 mmol/L (ref 98–111)
Creatinine: 0.68 mg/dL (ref 0.44–1.00)
GFR, Estimated: >60 mL/min (ref >60)
Glucose, Bld: 85 mg/dL (ref 70–99)
Potassium: 4.2 mmol/L (ref 3.5–5.1)
Sodium: 140 mmol/L (ref 135–145)
Total Bilirubin: 0.4 mg/dL (ref 0.3–1.2)
Total Protein: 8.1 g/dL (ref 6.5–8.1)

## 2022-08-05 LAB — VITAMIN B12: Vitamin B-12: 1992 pg/mL — ABNORMAL HIGH (ref 180–914)

## 2022-08-05 LAB — RETIC PANEL
Immature Retic Fract: 22.1 % — ABNORMAL HIGH (ref 2.3–15.9)
RBC.: 3.53 MIL/uL — ABNORMAL LOW (ref 3.87–5.11)
Retic Count, Absolute: 65.7 10*3/uL (ref 19.0–186.0)
Retic Ct Pct: 1.9 % (ref 0.4–3.1)
Reticulocyte Hemoglobin: 41.1 pg (ref >27.9)

## 2022-08-05 NOTE — Progress Notes (Signed)
[facility] Telephone:([phone]) [phone]   [phone]  PROGRESS NOTE  Patient Care Team: Merri Brunette, MD as PCP - General (Family Medicine)  Hematological/Oncological History 1) 08/13/2021-08/22/2021: Admitted for acute left lower extremity DVT and acute superficial femoral vein thrombophlebitis.  -Doppler US of lower extremity confirmed  acute DVT involving the left common femoral vein, left femoral vein, and left popliteal vein. Additionally, acute superficial vein thrombosis involving the left great saphenous vein.  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  Mechanical thrombectomy and balloon venoplasty of the ileal femoral venous segments were done removing almost entirely the chronic thrombus on 08/19/2021 -Xarelto was started on 08/20/2021.  -Found to have anemia secondary to iron deficiency s/p IV iron infusion x 1 dose. Vitamin B12 level low.   2) 10/14/2021: Establish care at [facility]   CHIEF COMPLAINTS/PURPOSE OF CONSULTATION:  -Left leg DVT -Vitamin B12 deficiency -Iron deficiency  HISTORY OF PRESENTING ILLNESS:  Marissa Gray 41 y.o. female returns for a follow up for h/o DVT of the left leg, vitamin B12 deficiency and iron deficiency. She is accompanied by her mother for this visit.    On exam today, Marissa Gray reports that after her last ultrasound she stopped her Xarelto therapy.  She reports that she has been losing weight and has been making efforts to drop weight.  She reports that her hip surgery has gone quite well and she is not currently in any pain.  She reports she "barely uses Tylenol".  She has a prescribed 500 mg 3 times daily but very rarely uses it 3 times daily.  She reports that she continues to take iron and vitamin B12 pills.  Is not causing any stomach upset or constipation.  Overall she feels quite well.  She denies easy bruising or active bleeding.  Patient denies any fevers, chills, night sweats, shortness of breath, chest pain or cough.  She has no other complaints.  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   Dermatitis    left leg    Down's syndrome    GERD (gastroesophageal reflux disease)    Hashimoto's disease    Heart murmur    Hypothyroidism    Pneumonia    hx of years ago    Rheumatoid arthritis ([facility])    Strabismus     SURGICAL HISTORY: Past Surgical History:  Procedure Laterality Date   CARDIAC SURGERY     ASD and vSD repair    FOOT ARTHRODESIS, TRIPLE     bilateral    I & D EXTREMITY Left 05/19/2021   Procedure: IRRIGATION AND DEBRIDEMENT LEFT HIP WITH POSSIBLE HEAD AND LINER EXCHANGE;  Surgeon: Samson Frederic, MD;  Location: [facility];  Service: Orthopedics;  Laterality: Left;   MECHANICAL THROMBECTOMY WITH AORTOGRAM AND INTERVENTION Left 08/19/2021   Procedure: LEFT LOWER EXTREMITY DEEP VEIN THROMBOSIS MECHANICAL THROMBECTOMY WITH INARI DEVICE;  Surgeon: Nada Libman, MD;  Location: [facility] OR;  Service: Vascular;  Laterality: Left;   ORIF TIBIA PLATEAU Right 06/22/2017   Procedure: OPEN REDUCTION INTERNAL FIXATION (ORIF) TIBIAL PLATEAU;  Surgeon: Myrene Galas, MD;  Location: [facility] OR;  Service: Orthopedics;  Laterality: Right;   STRABISMUS SURGERY     TOTAL  HIP ARTHROPLASTY Left 04/23/2021   Procedure: TOTAL HIP ARTHROPLASTY-Posterior;  Surgeon: Gaynelle Arabian, MD;  Location: [facility];  Service: Orthopedics;  Laterality: Left;  124min   ulcer surgery      ULTRASOUND GUIDANCE FOR VASCULAR ACCESS Left 08/19/2021   Procedure: ULTRASOUND GUIDANCE FOR VASCULAR ACCESS;  Surgeon: Serafina Mitchell, MD;  Location: [facility];  Service: Vascular;  Laterality: Left;    SOCIAL HISTORY: Social History    Socioeconomic History   Marital status: Single    Spouse name: Not on file   Number of children: Not on file   Years of education: Not on file   Highest education level: Not on file  Occupational History   Not on file  Tobacco Use   Smoking status: Never   Smokeless tobacco: Never  Vaping Use   Vaping Use: Never used  Substance and Sexual Activity   Alcohol use: No   Drug use: No   Sexual activity: Not on file  Other Topics Concern   Not on file  Social History Narrative   Not on file   Social Determinants of Health   Financial Resource Strain: Not on file  Food Insecurity: Not on file  Transportation Needs: Not on file  Physical Activity: Not on file  Stress: Not on file  Social Connections: Not on file  Intimate Partner Violence: Not on file    FAMILY HISTORY: Family History  Problem Relation Age of Onset   Heart disease Neg Hx     ALLERGIES:  is allergic to cefaclor, penicillins, and sulfa antibiotics.  MEDICATIONS:  Current Outpatient Medications  Medication Sig Dispense Refill   acetaminophen (TYLENOL) 500 MG tablet Take 500 mg by mouth 3 (three) times daily as needed for moderate pain or fever.     albuterol (VENTOLIN HFA) 108 (90 Base) MCG/ACT inhaler Inhale 2 puffs into the lungs every 6 (six) hours as needed for wheezing or shortness of breath.     Ascorbic Acid (VITAMIN C) 500 MG CHEW Chew 1 tablet by mouth daily.     azithromycin (ZITHROMAX) 500 MG tablet Take 500 mg by mouth as directed. Take 1 tablet (500 mg) prior to Dental Procedure     calcium carbonate (OS-CAL) 600 MG TABS tablet Take 600 mg by mouth daily.     Cholecalciferol (VITAMIN D) 50 MCG (2000 UT) tablet Take 2,000 Units by mouth daily.     diclofenac Sodium (VOLTAREN) 1 % GEL Apply 2 g topically daily as needed (pain).     docusate sodium (COLACE) 100 MG capsule Take 100 mg by mouth 2 (two) times daily.     Eyelid Cleansers (OCUSOFT EYELID CLEANSING) PADS Place 1 application into both  eyes in the morning and at bedtime. Apply to Eyelid and eyelash topically two times a day for Dry eyes     Ferrous Sulfate (SLOW FE) 142 (45 Fe) MG TBCR Take 1 tablet by mouth daily.     fluticasone (FLOVENT HFA) 110 MCG/ACT inhaler Inhale 1 puff into the lungs 2 (two) times daily.     folic acid (FOLVITE) 1 MG tablet Take 3 mg by mouth in the morning. (0700)     ketotifen (ZADITOR) 0.025 % ophthalmic solution Place 1 drop into both eyes 2 (two) times daily.     levothyroxine (SYNTHROID) 75 MCG tablet Take 75 mcg by mouth daily before breakfast.     loratadine (CLARITIN) 10 MG tablet Take 10 mg by mouth daily as needed for allergies.  methotrexate (RHEUMATREX) 2.5 MG tablet Take 7.5-10 mg by mouth as directed. Take 3 tablets (7.5 mg) on Thurs Morning & Take 4 tablets (10 mg) on Thurs Evening. Caution:Chemotherapy. Protect from light.     miconazole (MICOTIN) 2 % powder Apply 1 application topically in the morning and at bedtime.     mineral oil-hydrophilic petrolatum (AQUAPHOR) ointment Apply 1 application topically daily.     montelukast (SINGULAIR) 10 MG tablet Take 10 mg by mouth at bedtime.     Multiple Vitamins-Minerals (MULTIVITAMIN ADULTS PO) Take 1 tablet by mouth daily.     omeprazole (PRILOSEC) 40 MG capsule Take 40 mg by mouth daily.     potassium chloride (MICRO-K) 10 MEQ CR capsule Take 10 mEq by mouth 2 (two) times daily. (0700 & 2000)     pseudoephedrine (SUDAFED) 30 MG tablet Take 30 mg by mouth every 4 (four) hours as needed for congestion.     psyllium (REGULOID) 0.52 g capsule Take 0.52 g by mouth daily.     rivaroxaban (XARELTO) 10 MG TABS tablet Take 1 tablet (10 mg total) by mouth daily with breakfast. 360 tablet 0   sodium chloride (OCEAN) 0.65 % nasal spray Place 1 spray into the nose 2 (two) times daily. (0700 & 2000)     vitamin B-12 (CYANOCOBALAMIN) 1000 MCG tablet Take 1 tablet (1,000 mcg total) by mouth daily. 30 tablet 3   No current facility-administered  medications for this visit.    REVIEW OF SYSTEMS:   Constitutional: ( - ) fevers, ( - )  chills , ( - ) night sweats Eyes: ( - ) blurriness of vision, ( - ) double vision, ( - ) watery eyes Ears, nose, mouth, throat, and face: ( - ) mucositis, ( - ) sore throat Respiratory: ( - ) cough, ( - ) dyspnea, ( - ) wheezes Cardiovascular: ( - ) palpitation, ( - ) chest discomfort, ( -) lower extremity swelling Gastrointestinal:  ( - ) nausea, ( - ) heartburn, ( - ) change in bowel habits Skin: ( - ) abnormal skin rashes Lymphatics: ( - ) new lymphadenopathy, ( - ) easy bruising Neurological: ( - ) numbness, ( - ) tingling, ( - ) new weaknesses Behavioral/Psych: ( - ) mood change, ( - ) new changes  All other systems were reviewed with the patient and are negative.  PHYSICAL EXAMINATION: ECOG PERFORMANCE STATUS: 0 - Asymptomatic  Vitals:   08/05/22 1205  BP: 90/72  Pulse: 79  Resp: 18  SpO2: 100%   Filed Weights   08/05/22 1205  Weight: 162 lb 12.8 oz (73.8 kg)    GENERAL: well appearing female in NAD  SKIN: skin color, texture, turgor are normal, no rashes or significant lesions EYES: conjunctiva are pink and non-injected, sclera clear OROPHARYNX: no exudate, no erythema; lips, buccal mucosa, and tongue normal  LUNGS: clear to auscultation and percussion with normal breathing effort HEART: regular rate & rhythm and no murmurs and no lower extremity edema ABDOMEN: soft, non-tender, non-distended, normal bowel sounds Musculoskeletal: no cyanosis of digits and no clubbing  PSYCH: alert & oriented x 3, fluent speech   LABORATORY DATA:  I have reviewed the data as listed    Latest Ref Rng & Units 08/05/2022   11:05 AM 02/03/2022   10:53 AM 10/14/2021    1:36 PM  CBC  WBC 4.0 - 10.5 K/uL 5.2  4.7  4.9   Hemoglobin 12.0 - 15.0 g/dL 44.8  18.5  63.1  Hematocrit 36.0 - 46.0 % 38.9  38.6  34.4   Platelets 150 - 400 K/uL 297  334  295        Latest Ref Rng & Units 08/05/2022   11:05  AM 10/14/2021    1:36 PM 08/22/2021    5:43 AM  CMP  Glucose 70 - 99 mg/dL 85  83    BUN 6 - 20 mg/dL 17  9    Creatinine 7.16 - 1.00 mg/dL 9.67  8.93  8.10   Sodium 135 - 145 mmol/L 140  140    Potassium 3.5 - 5.1 mmol/L 4.2  4.3    Chloride 98 - 111 mmol/L 107  109    CO2 22 - 32 mmol/L 26  20    Calcium 8.9 - 10.3 mg/dL 9.1  8.7    Total Protein 6.5 - 8.1 g/dL 8.1  8.0    Total Bilirubin 0.3 - 1.2 mg/dL 0.4  0.3    Alkaline Phos 38 - 126 U/L 73  109    AST 15 - 41 U/L 19  20    ALT 0 - 44 U/L 11  22      ASSESSMENT & PLAN Marissa Gray is a 41 y.o. female who presents to the clinic for evaluation for history of left lower leg DVT currently on Xarelto.  We reviewed provoking factors for venous thromboembolisms including recent surgery, infectious process, estrogen based hormone therapy, prolonged immobility, recent travel and smoking.  I discussed with the patient and her mother that the likely cause of her left lower leg DVT was multifocal including recent left hip replacement that was complicated by cellulitis requiring I&D.  Additionally patient was on estrogen-based birth control when she developed the DVT.  For these reasons, we recommended anticoagulation for total of 6 months.  #Left Leg DVT: --Felt to be provoked by left hip arthroplasty in April 2022 complicated by serous wound drainage status post I&D and surgical exploration in May 2022. Additionally, patient was on oral contraceptives when she was diagnosed with DVT. --Patient discontinued estrogen OCPs and switch to progesterone based hormone therapy.  -- Discontinued Xarelto 20 mg as she has completed  a total of six months.  #Normocytc Anemia: --Likely multifocal due to RA currently on methrotrexate, iron deficiency anemia and possibly vitamin B12 deficiency.  --S/p IV venofer x 1 on 08/17/2021.  --Labs today show white blood cell 5.2, hemoglobin 13.6, platelets of 297. --Currently on Slow Fe (45 mg of elemental iron)  once daily. --Currently on vitamin B12 supplementation 1000 mcg once daily. -- Will determine if continued p.o. supplementation of her vitamins is necessary based on today's labs.  Follow up: --RTC PRN  No orders of the defined types were placed in this encounter.   All questions were answered. The patient knows to call the clinic with any problems, questions or concerns.  I have spent a total of 30 minutes minutes of face-to-face and non-face-to-face time, preparing to see the patient,  performing a medically appropriate examination, counseling and educating the patient, documenting clinical information in the electronic health record, and care coordination.   Ulysees Barns, MD Department of Hematology/Oncology [facility] Phone: [phone] Pager: [phone] Email: [email or web address]

## 2022-08-06 LAB — FERRITIN: Ferritin: 177 ng/mL (ref 11–307)

## 2022-08-09 LAB — METHYLMALONIC ACID, SERUM: Methylmalonic Acid, Quantitative: 135 nmol/L (ref 0–378)

## 2022-08-13 ENCOUNTER — Telehealth: Payer: Self-pay | Admitting: *Deleted

## 2022-08-13 NOTE — Telephone Encounter (Signed)
TCT patient's mother, [name] regarding recent lab results.No answer but able to leave message on identified vm. Advised that Annjanette's B12 levels are very good-advised that she can stop taking the B12 for now and to follow up with her PCP for future needs of B12. Advised to continue her oral iron as her levels have increased nicely.. She is not currently anemic. Advised to return call to [phone] with any questions or concerns.

## 2023-10-25 ENCOUNTER — Other Ambulatory Visit: Payer: Self-pay

## 2024-02-01 NOTE — Progress Notes (Deleted)
 Office Visit Note  Patient: Marissa Gray             Date of Birth: 07/10/1981           MRN: [id number]             PCP: Merri Brunette, MD Referring: Merri Brunette, MD Visit Date: 02/15/2024 Occupation: @GUAROCC @  Subjective:  No chief complaint on file.   History of Present Illness: Marissa Gray is a 43 y.o. female ***   Patient was evaluated by Dr. Nickola Major in July.  Dr. Toni Amend office notified PCP that they do do not take Medicaid.  Patient was started on methotrexate by Dr. Nickola Major in July.  Patient was experiencing some nausea.  She was kept on methotrexate 7 tablets p.o. weekly with split dosing and was given Tums.  She was also using Voltaren gel topically on her joints.  There was a discussion about adding hydroxychloroquine and lowering the dose of methotrexate.  Activities of Daily Living:  Patient reports morning stiffness for *** {minute/hour:19697}.   Patient {ACTIONS;DENIES/REPORTS:21021675::"Denies"} nocturnal pain.  Difficulty dressing/grooming: {ACTIONS;DENIES/REPORTS:21021675::"Denies"} Difficulty climbing stairs: {ACTIONS;DENIES/REPORTS:21021675::"Denies"} Difficulty getting out of chair: {ACTIONS;DENIES/REPORTS:21021675::"Denies"} Difficulty using hands for taps, buttons, cutlery, and/or writing: {ACTIONS;DENIES/REPORTS:21021675::"Denies"}  No Rheumatology ROS completed.   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, 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   Gastroesophageal reflux disease without esophagitis    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   Down's syndrome    Rheumatoid arthritis involving multiple sites [facility])    Closed bicondylar fracture of right tibial plateau 06/20/2017    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   Dermatitis    left leg    Down's syndrome    GERD (gastroesophageal reflux disease)    Hashimoto's disease    Heart murmur    Hypothyroidism    Pneumonia    hx of years ago    Rheumatoid arthritis (HCC)    Strabismus     Family History  Problem Relation Age of Onset   Heart disease Neg Hx    Past Surgical History:  Procedure Laterality Date   CARDIAC SURGERY     ASD and vSD repair    FOOT ARTHRODESIS, TRIPLE     bilateral    I & D EXTREMITY Left 05/19/2021   Procedure: IRRIGATION AND DEBRIDEMENT LEFT HIP WITH POSSIBLE HEAD AND LINER EXCHANGE;  Surgeon: Samson Frederic, MD;  Location: [facility];  Service: Orthopedics;  Laterality: Left;   MECHANICAL THROMBECTOMY WITH AORTOGRAM AND INTERVENTION Left 08/19/2021   Procedure: LEFT LOWER EXTREMITY DEEP VEIN THROMBOSIS MECHANICAL THROMBECTOMY WITH INARI DEVICE;  Surgeon: Nada Libman, MD;  Location: [facility] OR;  Service: Vascular;  Laterality: Left;   ORIF TIBIA PLATEAU Right 06/22/2017   Procedure: OPEN REDUCTION INTERNAL FIXATION (ORIF) TIBIAL PLATEAU;  Surgeon: Carola Frost,  Casimiro Needle, MD;  Location: [facility];  Service: Orthopedics;  Laterality: Right;   STRABISMUS SURGERY     TOTAL HIP ARTHROPLASTY Left 04/23/2021   Procedure: TOTAL HIP ARTHROPLASTY-Posterior;  Surgeon: Ollen Gross, MD;  Location: [facility];  Service: Orthopedics;  Laterality: Left;    ulcer surgery      ULTRASOUND GUIDANCE FOR VASCULAR ACCESS Left 08/19/2021   Procedure: ULTRASOUND  GUIDANCE FOR VASCULAR ACCESS;  Surgeon: Nada Libman, MD;  Location: [facility];  Service: Vascular;  Laterality: Left;   Social History   Social History Narrative   Not on file   Immunization History  Administered Date(s) Administered   Influenza-Unspecified 10/19/2018   PPD Test 06/26/2017     Objective: Vital Signs: There were no vitals taken for this visit.   Physical Exam   Musculoskeletal Exam: ***  CDAI Exam: CDAI Score: -- Patient Global: --; Provider Global: -- Swollen: --; Tender: -- Joint Exam 02/15/2024   No joint exam has been documented for this visit   There is currently no information documented on the homunculus. Go to the Rheumatology activity and complete the homunculus joint exam.  Investigation: No additional findings.  Imaging: No results found.  Recent Labs: Lab Results  Component Value Date   WBC 5.2 08/05/2022   HGB 13.6 08/05/2022   PLT 297 08/05/2022   NA 140 08/05/2022   K 4.2 08/05/2022   CL 107 08/05/2022   CO2 26 08/05/2022   GLUCOSE 85 08/05/2022   BUN 17 08/05/2022   CREATININE 0.68 08/05/2022   BILITOT 0.4 08/05/2022   ALKPHOS 73 08/05/2022   AST 19 08/05/2022   ALT 11 08/05/2022   PROT 8.1 08/05/2022   ALBUMIN 4.2 08/05/2022   CALCIUM 9.1 08/05/2022   GFRAA >60 06/24/2017   July 20, 2023 WBC 3.8, hemoglobin 12.9, platelets 311, CMP normal, TSH 0.55  Speciality Comments: No specialty comments available.  Procedures:  No procedures performed Allergies: Cefaclor, Penicillins, and Sulfa antibiotics   Assessment / Plan:     Visit Diagnoses: No diagnosis found.  Orders: No orders of the defined types were placed in this encounter.  No orders of the defined types were placed in this encounter.   Face-to-face time spent with patient was *** minutes. Greater than 50% of time was spent in counseling and coordination of care.  Follow-Up Instructions: No follow-ups on file.   Pollyann Savoy, MD  Note - This  record has been created using Animal nutritionist.  Chart creation errors have been sought, but may not always  have been located. Such creation errors do not reflect on  the standard of medical care.

## 2024-02-15 ENCOUNTER — Encounter: Payer: Medicaid Other | Admitting: Rheumatology

## 2024-02-15 DIAGNOSIS — E785 Hyperlipidemia, unspecified: Secondary | ICD-10-CM

## 2024-02-15 DIAGNOSIS — Q909 Down syndrome, unspecified: Secondary | ICD-10-CM

## 2024-02-15 DIAGNOSIS — Z79899 Other long term (current) drug therapy: Secondary | ICD-10-CM

## 2024-02-15 DIAGNOSIS — M532X1 Spinal instabilities, occipito-atlanto-axial region: Secondary | ICD-10-CM

## 2024-02-15 DIAGNOSIS — M2550 Pain in unspecified joint: Secondary | ICD-10-CM

## 2024-02-15 DIAGNOSIS — D649 Anemia, unspecified: Secondary | ICD-10-CM

## 2024-02-15 DIAGNOSIS — G8929 Other chronic pain: Secondary | ICD-10-CM

## 2024-02-15 DIAGNOSIS — M0609 Rheumatoid arthritis without rheumatoid factor, multiple sites: Secondary | ICD-10-CM

## 2024-02-15 DIAGNOSIS — J4540 Moderate persistent asthma, uncomplicated: Secondary | ICD-10-CM

## 2024-02-15 DIAGNOSIS — S82141K Displaced bicondylar fracture of right tibia, subsequent encounter for closed fracture with nonunion: Secondary | ICD-10-CM

## 2024-02-15 DIAGNOSIS — E063 Autoimmune thyroiditis: Secondary | ICD-10-CM

## 2024-02-15 DIAGNOSIS — I825Y2 Chronic embolism and thrombosis of unspecified deep veins of left proximal lower extremity: Secondary | ICD-10-CM

## 2024-02-15 DIAGNOSIS — M1612 Unilateral primary osteoarthritis, left hip: Secondary | ICD-10-CM

## 2024-02-15 DIAGNOSIS — K219 Gastro-esophageal reflux disease without esophagitis: Secondary | ICD-10-CM

## 2024-03-21 ENCOUNTER — Ambulatory Visit: Payer: Medicaid Other | Admitting: Rheumatology

## 2024-05-04 ENCOUNTER — Ambulatory Visit (INDEPENDENT_AMBULATORY_CARE_PROVIDER_SITE_OTHER): Payer: MEDICAID | Admitting: Podiatry

## 2024-05-04 DIAGNOSIS — B351 Tinea unguium: Secondary | ICD-10-CM | POA: Diagnosis not present

## 2024-05-04 DIAGNOSIS — L600 Ingrowing nail: Secondary | ICD-10-CM

## 2024-05-04 NOTE — Progress Notes (Signed)
 Patient presents for evaluation and treatment of tenderness and some redness around nails feet.   Physical exam:  General appearance: Alert, pleasant, and in no acute distress.  Vascular: Pedal pulses: DP palpable bilaterally, PT diminished bilaterally.  Moderate edema lower legs bilaterally  Neurological:  No paresthesias or burning noted.  Dermatologic:  Nails thickened, disfigured, discolored 1-5 BL with subungual debris.  Some redness along nail folds bilaterally but no signs of drainage or infection.  Musculoskeletal:  Toe deformities 2 through 5 bilaterally.  HAV deformity bilaterally   Diagnosis: Painful onychomycotic nails 1 through 5 bilaterally. Pain toes 1 through 5 bilaterally.  Plan: Debrided onychomycotic nails 1 through 5 bilaterally.  Return 3 months

## 2024-08-04 ENCOUNTER — Ambulatory Visit: Payer: MEDICAID | Admitting: Podiatry

## 2024-08-10 ENCOUNTER — Ambulatory Visit (INDEPENDENT_AMBULATORY_CARE_PROVIDER_SITE_OTHER): Admitting: Podiatry

## 2024-08-10 ENCOUNTER — Encounter: Payer: Self-pay | Admitting: Podiatry

## 2024-08-10 DIAGNOSIS — B351 Tinea unguium: Secondary | ICD-10-CM | POA: Diagnosis not present

## 2024-08-10 DIAGNOSIS — M79675 Pain in left toe(s): Secondary | ICD-10-CM | POA: Diagnosis not present

## 2024-08-10 DIAGNOSIS — M79674 Pain in right toe(s): Secondary | ICD-10-CM

## 2024-08-10 NOTE — Progress Notes (Signed)
  Subjective:  Patient ID: ZONIA CAPLIN, female    DOB: 03/19/1981,   MRN: [id number]  No chief complaint on file.   43 y.o. female presents for concern of thickened elongated and painful nails that are difficult to trim. Requesting to have them trimmed today. She has a history of Downs, RA and hashimotos. She is at risk for foot care.   PCP:  Claudene Pellet, MD    . Denies any other pedal complaints. Denies n/v/f/c.   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 CFT <3 seconds. Absent hair growth on digits. Edema noted to bilateral lower extremities. Xerosis noted bilaterally.  Skin. No lacerations or abrasions bilateral feet. Nails 1-5 bilateral  are thickened discolored and elongated with subungual debris.  Musculoskeletal: MMT 5/5 bilateral lower extremities in DF, PF, Inversion and Eversion. Deceased ROM in DF of ankle joint. Bilateral HAV deformity noted and hammered digits 2-5 bilateral.  Neurological: Sensation intact to light touch. Protective sensation intact bilateral.    Assessment:   1. Pain due to onychomycosis of toenails of both feet      Plan:  Patient was evaluated and treated and all questions answered. -Discussed and educated patient on  foot care, especially with  regards to the vascular, neurological and musculoskeletal systems.  -Discussed supportive shoes at all times and checking feet regularly.  -Mechanically debrided all nails 1-5 bilateral using sterile nail nipper and filed with dremel  without incident  -Answered all patient questions -Patient to return  in 3 months for at risk foot care -Patient advised to call the office if any problems or questions arise in the meantime.   Asberry Failing, DPM

## 2024-11-10 ENCOUNTER — Ambulatory Visit: Admitting: Podiatry

## 2024-11-10 ENCOUNTER — Encounter: Payer: Self-pay | Admitting: Podiatry

## 2024-11-10 DIAGNOSIS — M79674 Pain in right toe(s): Secondary | ICD-10-CM | POA: Diagnosis not present

## 2024-11-10 DIAGNOSIS — B351 Tinea unguium: Secondary | ICD-10-CM

## 2024-11-10 DIAGNOSIS — M79675 Pain in left toe(s): Secondary | ICD-10-CM | POA: Diagnosis not present

## 2024-11-10 NOTE — Progress Notes (Addendum)
  Subjective:  Patient ID: Marissa Gray, female    DOB: Apr 18, 1981,   MRN: [id number]  Chief Complaint  Patient presents with   Nail Problem    Do my feet, I have a few spots that need to be worked on.    43 y.o. female presents for concern of thickened elongated and painful nails that are difficult to trim. Requesting to have them trimmed today. She has a history of Downs, RA and hashimotos. She is at risk for foot care.   PCP:  Claudene Pellet, MD    . Denies any other pedal complaints. Denies n/v/f/c.   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 CFT <3 seconds. Absent hair growth on digits. Edema noted to bilateral lower extremities. Xerosis noted bilaterally.  Skin. No lacerations or abrasions bilateral feet. Nails 1-5 bilateral  are thickened discolored and elongated with subungual debris.  Hypercare ptotic lesion noted to subsecond metatarsal head bilateral.  Also several hyperkeratotic core lesions noted to dorsums of digit lesser digits.  Musculoskeletal: MMT 5/5 bilateral lower extremities in DF, PF, Inversion and Eversion. Deceased ROM in DF of ankle joint. Bilateral HAV deformity noted and hammered digits 2-5 bilateral.  Neurological: Sensation intact to light touch. Protective sensation intact bilateral.    Assessment:   1. Pain due to onychomycosis of toenails of both feet      Plan:  Patient was evaluated and treated and all questions answered. ABN signed -Discussed and educated patient on  foot  care, especially with  regards to the vascular, neurological and musculoskeletal systems.  -Discussed supportive shoes at all times and checking feet regularly.  -Mechanically debrided all nails 1-5 bilateral using sterile nail nipper and filed with dremel without incident  -Hyperkeratotic lesions to plantar second metatarsal heads bilateral debrided without incidence with chisel. -Answered all patient questions -Patient to return  in 3 months for at risk foot care -Patient advised to call the office if any problems or questions arise in the meantime.   Asberry Failing, DPM

## 2025-02-09 ENCOUNTER — Ambulatory Visit: Admitting: Podiatry
